# Patient Record
Sex: Female | Born: 1940 | Race: Black or African American | Hispanic: No | Marital: Married | Smoking: Never smoker
Health system: Southern US, Community
[De-identification: ages and names within clinical notes are randomized; demographics above are authoritative.]

## PROBLEM LIST (undated history)

## (undated) DIAGNOSIS — E119 Type 2 diabetes mellitus without complications: Secondary | ICD-10-CM

## (undated) DIAGNOSIS — I639 Cerebral infarction, unspecified: Secondary | ICD-10-CM

## (undated) DIAGNOSIS — E785 Hyperlipidemia, unspecified: Secondary | ICD-10-CM

## (undated) DIAGNOSIS — I10 Essential (primary) hypertension: Secondary | ICD-10-CM

## (undated) HISTORY — DX: Essential (primary) hypertension: I10

## (undated) HISTORY — DX: Hyperlipidemia, unspecified: E78.5

---

## 1997-09-04 ENCOUNTER — Ambulatory Visit (HOSPITAL_COMMUNITY): Admission: RE | Admit: 1997-09-04 | Discharge: 1997-09-04 | Payer: Self-pay | Admitting: General Surgery

## 1998-09-17 ENCOUNTER — Ambulatory Visit (HOSPITAL_COMMUNITY): Admission: RE | Admit: 1998-09-17 | Discharge: 1998-09-17 | Payer: Self-pay | Admitting: General Surgery

## 1999-06-13 ENCOUNTER — Emergency Department (HOSPITAL_COMMUNITY): Admission: EM | Admit: 1999-06-13 | Discharge: 1999-06-13 | Payer: Self-pay | Admitting: Emergency Medicine

## 1999-09-17 ENCOUNTER — Encounter (HOSPITAL_BASED_OUTPATIENT_CLINIC_OR_DEPARTMENT_OTHER): Payer: Self-pay | Admitting: General Surgery

## 1999-09-17 ENCOUNTER — Ambulatory Visit (HOSPITAL_COMMUNITY): Admission: RE | Admit: 1999-09-17 | Discharge: 1999-09-17 | Payer: Self-pay | Admitting: General Surgery

## 2000-03-01 ENCOUNTER — Encounter: Admission: RE | Admit: 2000-03-01 | Discharge: 2000-03-01 | Payer: Self-pay | Admitting: Family Medicine

## 2000-03-01 ENCOUNTER — Encounter: Payer: Self-pay | Admitting: Family Medicine

## 2000-03-24 ENCOUNTER — Encounter: Admission: RE | Admit: 2000-03-24 | Discharge: 2000-06-22 | Payer: Self-pay | Admitting: Family Medicine

## 2000-09-27 ENCOUNTER — Encounter (HOSPITAL_BASED_OUTPATIENT_CLINIC_OR_DEPARTMENT_OTHER): Payer: Self-pay | Admitting: General Surgery

## 2000-09-27 ENCOUNTER — Ambulatory Visit (HOSPITAL_COMMUNITY): Admission: RE | Admit: 2000-09-27 | Discharge: 2000-09-27 | Payer: Self-pay | Admitting: General Surgery

## 2000-12-05 ENCOUNTER — Ambulatory Visit (HOSPITAL_COMMUNITY): Admission: RE | Admit: 2000-12-05 | Discharge: 2000-12-05 | Payer: Self-pay | Admitting: Family Medicine

## 2001-09-29 ENCOUNTER — Encounter (HOSPITAL_BASED_OUTPATIENT_CLINIC_OR_DEPARTMENT_OTHER): Payer: Self-pay | Admitting: General Surgery

## 2001-09-29 ENCOUNTER — Ambulatory Visit (HOSPITAL_COMMUNITY): Admission: RE | Admit: 2001-09-29 | Discharge: 2001-09-29 | Payer: Self-pay | Admitting: General Surgery

## 2002-10-01 ENCOUNTER — Encounter: Payer: Self-pay | Admitting: Obstetrics and Gynecology

## 2002-10-01 ENCOUNTER — Ambulatory Visit (HOSPITAL_COMMUNITY): Admission: RE | Admit: 2002-10-01 | Discharge: 2002-10-01 | Payer: Self-pay | Admitting: Obstetrics and Gynecology

## 2003-10-02 ENCOUNTER — Ambulatory Visit (HOSPITAL_COMMUNITY): Admission: RE | Admit: 2003-10-02 | Discharge: 2003-10-02 | Payer: Self-pay | Admitting: Family Medicine

## 2004-01-16 ENCOUNTER — Encounter: Admission: RE | Admit: 2004-01-16 | Discharge: 2004-01-16 | Payer: Self-pay | Admitting: Family Medicine

## 2004-02-10 ENCOUNTER — Emergency Department (HOSPITAL_COMMUNITY): Admission: EM | Admit: 2004-02-10 | Discharge: 2004-02-10 | Payer: Self-pay | Admitting: Emergency Medicine

## 2004-09-04 ENCOUNTER — Emergency Department (HOSPITAL_COMMUNITY): Admission: EM | Admit: 2004-09-04 | Discharge: 2004-09-04 | Payer: Self-pay

## 2004-10-02 ENCOUNTER — Ambulatory Visit (HOSPITAL_COMMUNITY): Admission: RE | Admit: 2004-10-02 | Discharge: 2004-10-02 | Payer: Self-pay | Admitting: Family Medicine

## 2005-03-30 ENCOUNTER — Inpatient Hospital Stay (HOSPITAL_COMMUNITY): Admission: EM | Admit: 2005-03-30 | Discharge: 2005-04-01 | Payer: Self-pay | Admitting: Emergency Medicine

## 2005-03-31 ENCOUNTER — Ambulatory Visit: Payer: Self-pay | Admitting: Cardiology

## 2005-03-31 ENCOUNTER — Encounter: Payer: Self-pay | Admitting: Cardiology

## 2005-10-04 ENCOUNTER — Ambulatory Visit (HOSPITAL_COMMUNITY): Admission: RE | Admit: 2005-10-04 | Discharge: 2005-10-04 | Payer: Self-pay | Admitting: Family Medicine

## 2006-01-22 ENCOUNTER — Emergency Department (HOSPITAL_COMMUNITY): Admission: EM | Admit: 2006-01-22 | Discharge: 2006-01-22 | Payer: Self-pay | Admitting: Emergency Medicine

## 2006-09-13 ENCOUNTER — Encounter: Admission: RE | Admit: 2006-09-13 | Discharge: 2006-09-13 | Payer: Self-pay | Admitting: Orthopedic Surgery

## 2006-10-06 ENCOUNTER — Ambulatory Visit (HOSPITAL_COMMUNITY): Admission: RE | Admit: 2006-10-06 | Discharge: 2006-10-06 | Payer: Self-pay | Admitting: Family Medicine

## 2007-02-11 ENCOUNTER — Ambulatory Visit: Payer: Self-pay | Admitting: Cardiology

## 2007-02-11 ENCOUNTER — Encounter (INDEPENDENT_AMBULATORY_CARE_PROVIDER_SITE_OTHER): Payer: Self-pay | Admitting: Neurology

## 2007-02-11 ENCOUNTER — Inpatient Hospital Stay (HOSPITAL_COMMUNITY): Admission: EM | Admit: 2007-02-11 | Discharge: 2007-02-16 | Payer: Self-pay | Admitting: Emergency Medicine

## 2007-02-12 ENCOUNTER — Encounter (INDEPENDENT_AMBULATORY_CARE_PROVIDER_SITE_OTHER): Payer: Self-pay | Admitting: Neurology

## 2007-02-13 ENCOUNTER — Encounter (INDEPENDENT_AMBULATORY_CARE_PROVIDER_SITE_OTHER): Payer: Self-pay | Admitting: Neurology

## 2007-02-15 ENCOUNTER — Encounter (INDEPENDENT_AMBULATORY_CARE_PROVIDER_SITE_OTHER): Payer: Self-pay | Admitting: Neurology

## 2007-10-10 ENCOUNTER — Ambulatory Visit (HOSPITAL_COMMUNITY): Admission: RE | Admit: 2007-10-10 | Discharge: 2007-10-10 | Payer: Self-pay | Admitting: Family Medicine

## 2008-03-11 ENCOUNTER — Encounter: Admission: RE | Admit: 2008-03-11 | Discharge: 2008-03-11 | Payer: Self-pay | Admitting: Orthopedic Surgery

## 2008-10-11 ENCOUNTER — Ambulatory Visit (HOSPITAL_COMMUNITY): Admission: RE | Admit: 2008-10-11 | Discharge: 2008-10-11 | Payer: Self-pay | Admitting: Family Medicine

## 2009-10-14 ENCOUNTER — Ambulatory Visit (HOSPITAL_COMMUNITY): Admission: RE | Admit: 2009-10-14 | Discharge: 2009-10-14 | Payer: Self-pay | Admitting: Family Medicine

## 2010-06-28 ENCOUNTER — Encounter: Payer: Self-pay | Admitting: Family Medicine

## 2010-09-15 ENCOUNTER — Ambulatory Visit
Admission: RE | Admit: 2010-09-15 | Discharge: 2010-09-15 | Disposition: A | Discharge status: Home / Self Care | Payer: Medicare Other | Source: Ambulatory Visit | Attending: Orthopedic Surgery | Admitting: Orthopedic Surgery

## 2010-09-15 ENCOUNTER — Other Ambulatory Visit: Payer: Self-pay | Admitting: Orthopedic Surgery

## 2010-09-15 ENCOUNTER — Other Ambulatory Visit (HOSPITAL_COMMUNITY): Payer: Self-pay | Admitting: Family Medicine

## 2010-09-15 DIAGNOSIS — Z1231 Encounter for screening mammogram for malignant neoplasm of breast: Secondary | ICD-10-CM

## 2010-09-15 DIAGNOSIS — M545 Low back pain: Secondary | ICD-10-CM

## 2010-09-28 ENCOUNTER — Other Ambulatory Visit: Payer: Self-pay | Admitting: Orthopedic Surgery

## 2010-09-28 DIAGNOSIS — M545 Low back pain: Secondary | ICD-10-CM

## 2010-10-08 ENCOUNTER — Ambulatory Visit
Admission: RE | Admit: 2010-10-08 | Discharge: 2010-10-08 | Disposition: A | Discharge status: Home / Self Care | Payer: Medicare Other | Source: Ambulatory Visit | Attending: Orthopedic Surgery | Admitting: Orthopedic Surgery

## 2010-10-08 DIAGNOSIS — M545 Low back pain: Secondary | ICD-10-CM

## 2010-10-08 MED ORDER — GADOBENATE DIMEGLUMINE 529 MG/ML IV SOLN
16.0000 mL | Freq: Once | INTRAVENOUS | Status: AC | PRN
  Administered 2010-10-08: 16 mL via INTRAVENOUS

## 2010-10-09 ENCOUNTER — Other Ambulatory Visit: Payer: Medicare Other

## 2010-10-13 ENCOUNTER — Emergency Department (HOSPITAL_COMMUNITY)
Admission: EM | Admit: 2010-10-13 | Discharge: 2010-10-14 | Disposition: A | Discharge status: Home / Self Care | Payer: Medicare Other | Attending: Emergency Medicine | Admitting: Emergency Medicine

## 2010-10-13 DIAGNOSIS — I10 Essential (primary) hypertension: Secondary | ICD-10-CM | POA: Insufficient documentation

## 2010-10-13 DIAGNOSIS — F29 Unspecified psychosis not due to a substance or known physiological condition: Secondary | ICD-10-CM | POA: Insufficient documentation

## 2010-10-13 LAB — BASIC METABOLIC PANEL
BUN: 19 mg/dL (ref 6–23)
GFR calc non Af Amer: 45 mL/min — ABNORMAL LOW (ref >60)
Potassium: 3.4 mEq/L — ABNORMAL LOW (ref 3.5–5.1)
Sodium: 141 mEq/L (ref 135–145)

## 2010-10-13 LAB — CBC
Platelets: 186 10*3/uL (ref 150–400)
RBC: 4.49 MIL/uL (ref 3.87–5.11)
WBC: 6.5 10*3/uL (ref 4.0–10.5)

## 2010-10-13 LAB — URINALYSIS, ROUTINE W REFLEX MICROSCOPIC
Ketones, ur: NEGATIVE mg/dL
Nitrite: NEGATIVE
Specific Gravity, Urine: 1.006 (ref 1.005–1.030)
pH: 7.5 (ref 5.0–8.0)

## 2010-10-13 LAB — DIFFERENTIAL
Basophils Absolute: 0 10*3/uL (ref 0.0–0.1)
Basophils Relative: 1 % (ref 0–1)
Eosinophils Absolute: 0.1 10*3/uL (ref 0.0–0.7)
Neutrophils Relative %: 79 % — ABNORMAL HIGH (ref 43–77)

## 2010-10-13 LAB — ETHANOL: Alcohol, Ethyl (B): <11 mg/dL — ABNORMAL HIGH (ref 0–10)

## 2010-10-13 LAB — RAPID URINE DRUG SCREEN, HOSP PERFORMED
Amphetamines: NOT DETECTED
Cocaine: NOT DETECTED
Tetrahydrocannabinol: NOT DETECTED

## 2010-10-13 LAB — URINE MICROSCOPIC-ADD ON

## 2010-10-14 DIAGNOSIS — F4320 Adjustment disorder, unspecified: Secondary | ICD-10-CM

## 2010-10-14 LAB — GLUCOSE, CAPILLARY: Glucose-Capillary: 100 mg/dL — ABNORMAL HIGH (ref 70–99)
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
## 2010-10-20 NOTE — Consult Note (Signed)
Sherri [name], Sherri [name] NO.:  [id number]   MEDICAL RECORD NO.:  [id number]          PATIENT TYPE:  EMS   LOCATION:  [facility]                         FACILITY:  [facility]   PHYSICIAN:  Marlan Palau, M.D.  DATE OF BIRTH:  Apr 23, 1941   DATE OF CONSULTATION:  02/10/2007  DATE OF DISCHARGE:                                 CONSULTATION   HISTORY OF PRESENT ILLNESS:  The patient is a 70 year old right-handed  black female born 2041-05-03 with a history of hypertension and  degenerative arthritis.  This patient comes to [facility] Emergency Room  after noting onset of slurred speech and nonsensical speech that began  around 9:15 p.m. this evening.  The patient denied any headache or  visual field change, apparently was noted to have some right facial  droop, but denied any weakness of the extremities, was able to walk  well.   The patient was brought to the [facility] Emergency Room where she was noted  to gradually be improving over time.  The CT scan of the brain showed an  old right frontal stroke but no acute changes were seen.  The patient is  not felt to be a TPA candidate due to minimal deficit with improving  deficit.  The patient, however, has waxed and waned during the emergency  room stay and has worsened with the speech with some minimal right  facial droop also being noted as well at times.  The patient therefore  appears to be slightly unstable neurologically.  The patient is being  admitted for evaluation of a stroke-like event.   PAST MEDICAL HISTORY:  The past medical history is significant for:  1. New onset of slurred speech and aphasia.  2. Hypertension.  3. Degenerative arthritis involving the right hip.  4. Bilateral tubal ligation.   ALLERGIES:  The patient has no known allergies.   SUBSTANCE HISTORY:  The patient does not smoke or drink.   CURRENT MEDICATIONS:  The current medications include Diovan/HCT 160/25  one daily, multivitamins daily, calcium 600  mg daily, Norvasc 10 mg  daily, sulindac 200 mg b.i.d.   SOCIAL HISTORY:  [place], is married, has four children who are alive and well.  The patient  is retired.   FAMILY HISTORY:  The family medical history is notable that both parents  have passed away, unknown cause.  Father had some sort of a blood  disease.  The patient has one brother who is alive and well.   REVIEW OF SYSTEMS:  The review of systems is notable for no recent  fevers or chills.  The patient denies headache, denies any problems with  visual field.  The patient has had no troubles with shortness of breath,  chest pains, abdominal pains, nausea, or vomiting.  The patient does  report some urinary urgency at times.  The patient has not any falls or  blackout episodes.   PHYSICAL EXAMINATION:  VITAL SIGNS:  Blood pressure is 138/71, heart  rate 83, respiratory rate 20, temperature afebrile.  GENERAL:  This patient is  a minimally obese black female who is alert,  cooperative at the time of the examination.  HEENT:  The head is atraumatic.  The eyes show the pupils are equal,  round, react to light.  Disks are flat bilaterally.  NECK:  The neck is supple.  No carotid bruits are noted.  RESPIRATORY:  Examination is clear.  CARDIOVASCULAR:  Examination reveals a regular rate and rhythm.  No  obvious murmurs or rubs noted.  EXTREMITIES:  The extremities are without significant edema.  NEUROLOGIC:  Cranial nerves are as above.  Facial symmetry is present on  the initial clinical examination.  Pinprick sensation of the face is  symmetric.  Extraocular movements are full, visual fields are full.  Speech is mildly dysarthric.  Motor test reveals 5/5 strength in all  fours.  Good symmetric motor is noted throughout.  Sensory testing is  intact to pinprick, soft touch, vibratory sensation throughout.  The  patient has good finger-nose-finger and heel-to-shin.  No drift is  seen  in the upper extremities or lower extremities.  Deep tendon reflexes are  symmetric.  Toes are downgoing bilaterally.   LABORATORY VALUES:  The laboratory values are notable for a white count  of 8.0, hemoglobin of 12.3, hematocrit 37.0, MCV of 88.9, and platelets  of 256.  INR of 0.9, sodium 138, potassium 3.7, chloride 105, CO2 28,  glucose of 98, BUN of 25, creatinine 1.32, total bilirubin of 0.7,  alkaline phosphatase of 66, SGOT of 27, SGPT of 18, total protein 6.8,  albumin 3.8, calcium 9.4, CK of 261, MB fraction 2.9, and troponin I of  0.03.   NIH stroke scale is one but subsequent evaluations, after clinical  worsening, has gone up to three.   IMPRESSION:  1. Left brain stroke event.  2. Hypertension.  3. History of prior cerebrovascular disease.   This patient has two medical record numbers and has had a stroke workup  in 2006 under the medical record number [id number], is currently being  admitted under medical record number [id number].  The patient had been  placed on aspirin after her last stroke evaluation but has gone off this  medication.  The patient comes in with a waxing and waning speech  deficit and mild right facial droop.  The patient is not a TPA candidate  due to minimal deficit and currently due to duration of deficit.  The  patient will be admitted to [facility] for further stroke evaluation.   PLAN:  1. Admission to [facility].  2. Consider IV heparin therapy given some instability with clinical      examination.  3. MRI of the brain.  4. MRI angiogram of intracranial and extracranial vessels.  5. A 2-D echocardiogram.  6. Urine drug screen.  7. Antiplatelet therapy as well.  Will follow the patient's clinical      course while in-house.      Marlan Palau, M.D.  Electronically Signed     CKW/MEDQ  D:  02/11/2007  T:  02/11/2007  Job:  [id number]   cc:   Marlan Palau, M.D.  Renaye Rakers, M.D.

## 2010-10-20 NOTE — Discharge Summary (Signed)
NAMEJOELLY, BOLANOS              ACCOUNT NO.:  [id number]   MEDICAL RECORD NO.:  [id number]          PATIENT TYPE:  INP   LOCATION:  3032                         FACILITY:  [facility]   PHYSICIAN:  Pramod P. Pearlean Brownie, MD    DATE OF BIRTH:  03-Sep-1940   DATE OF ADMISSION:  02/10/2007  DATE OF DISCHARGE:  02/16/2007                               DISCHARGE SUMMARY   DISCHARGE DIAGNOSES:  1. 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  2. Dyslipidemia.  3. Hypertension.  4. Right hip arthritis.  5. History of bilateral tubal ligation.  6. Urinary tract infection treated with 5-day course of Cipro.   DISCHARGE MEDICATIONS:  1. Diovan 160/25 mg a day.  2. Multivitamin a day.  3. Calcium 600 mg a day.  4. Amlodipine 10 mg a day.  5. Sulindac 200 mg b.i.d.  6. Fish oil a day.  7. Benefiber a day.  8. Simvastatin 20 mg at bedtime.  9. Aspirin 325 mg a day.   STUDIES PERFORMED:  1. CT of the brain on admission showed subacute, chronic, right      frontal lobe, subcortical infarct, chronic small vessel ischemic      disease and brain atrophy.  2. MRI of the brain shows acute, small nonhemorrhagic infarct,      posterior left frontal lobe bordering the junction with the left      parietal lobe.  Atrophy with several old infarcts and small vessel      disease type changes.  3. MRA of the head shows mild branch vessel atherosclerotic type      changes.  Minimal bulbous appearance of the right middle cerebral      artery.  Bifurcation may be related to origin of the vessel,      although tiny aneurysm cannot be excluded.  4. MRA of the neck shows no hemodynamically significant stenosis.  5. Carotid Doppler shows no ICA stenosis.  6. Two-dimensional echocardiogram shows EF of 65% with no source of      embolus.  7. EKG shows normal sinus rhythm, low-voltage QRS,  poor R-wave      progression.  No old tracings to compare.  8. TEE with an EF of 65%, no source of embolus, no PFO, no ASA, no      ASD.   LABORATORY STUDIES:  CBC with hemoglobin 11.3, hematocrit 34.2,  otherwise normal.  Chemistry normal.  Coagulation studies normal.  Liver  function tests not performed.  Cholesterol 178, triglycerides 78, HDL  40, LDL 122.  Urinalysis showed 11-20 white blood cells, 0-2 red blood  cells.  Urine drug screen was negative.  Homocystine 12.6.  Alcohol  level less than 5.  Hemoglobin A1c 5.9.  Cardiac enzymes with CK  elevated at 261, but MB and troponin negative.   HISTORY OF PRESENT ILLNESS:  [name] is 70 year old, right-  handed, African American female with a history of hypertension and  degenerative arthritis.  The patient presented to [facility] Emergency  Room after noting onset of slurred speech and nonsensical speech that  began around 9:15 p.m. the evening of admission.  She was brought to the  emergency room where she was noted to gradually improve over time.  CT  of the brain showed an old right frontal stroke, but no acute  abnormalities.  She was not felt to be a TPA candidate due to minimal  deficit with improving deficit.  The patient waxed and waned in the  emergency room with some minimal right facial droop.  She was admitted  to the hospital for stroke evaluation.  She did not receive TPA.   HOSPITAL COURSE:  MRI did reveal acute infarct in left parietal and  frontal regions as well as old bilateral infarcts.  It is felt that the  patient most likely had an embolic source, though no source was found  during hospitalization including during a transesophageal echocardiogram  which was unrevealing. of pain. The patient was found to have an  elevated LDL of 122 and was placed on statin for that.  She was  evaluated by PT/OT and speech therapy and felt to benefit from  outpatient PT and OT, but is safe to return home.  She has  been advised  that it is okay for her to drive at this point.  She does need  continuation of evaluation for possible embolic source as an outpatient.   CONDITION ON DISCHARGE:  The patient alert and oriented x3.  No aphasia  or dysarthria.  Eye movements are full.  She has no upper extremity  drift.  Her gait is slightly ataxic with tandem walking.  She has no  focal weakness in her arms and legs.  No sensory loss.   DISPOSITION:  Discharge home with family.   ACTIVITY:  Outpatient PT and OT.   DISCHARGE MEDICATIONS:  Aspirin for stroke prevention.   FOLLOW UP:  Follow up with primary care physician, Dr. Renaye Rakers,  within 1 month for ongoing risk factor control.  Outpatient cardiac  monitoring with [facility] arranged for February 17, 2007,  at 10 a.m.  Follow up with Dr. Pearlean Brownie in 2-3 months.      Sherri [name], N.P.    ______________________________  Sunny Schlein. Pearlean Brownie, MD    SB/MEDQ  D:  02/16/2007  T:  02/17/2007  Job:  [id number]   cc:   Renaye Rakers, M.D.  [facility]

## 2010-10-22 ENCOUNTER — Ambulatory Visit (HOSPITAL_COMMUNITY)
Admission: RE | Admit: 2010-10-22 | Discharge: 2010-10-22 | Disposition: A | Discharge status: Home / Self Care | Payer: Medicare Other | Source: Ambulatory Visit | Attending: Family Medicine | Admitting: Family Medicine

## 2010-10-22 DIAGNOSIS — Z1231 Encounter for screening mammogram for malignant neoplasm of breast: Secondary | ICD-10-CM | POA: Insufficient documentation

## 2010-10-23 NOTE — Discharge Summary (Signed)
Sherri [name], [name]              ACCOUNT NO.:  [id number]   MEDICAL RECORD NO.:  [id number]          PATIENT TYPE:  INP   LOCATION:  3708                         FACILITY:  [facility]   PHYSICIAN:  Michaelyn Barter, M.D. DATE OF BIRTH:  26-Aug-1940   DATE OF ADMISSION:  03/30/2005  DATE OF DISCHARGE:  04/01/2005                                 DISCHARGE SUMMARY   FINAL DIAGNOSES AT THE TIME OF DISCHARGE:  1.  Transient ischemic attack.  2.  Hyperlipidemia.   PRIMARY CARE PHYSICIAN:  Renaye Rakers, M.D.   HISTORY OF PRESENT ILLNESS:  [name] is a 70 year old female who arrived  in the ER with a chief complaint of transient right-sided weakness.  She  stated she felt well the morning of her admission.  She went to the [facility] and  completed an exercise routine.  On her way home, she stopped at a gas  station, and shortly after attempting to leave the gas station, her right  arm and hand became weak, and she had difficulty manipulating the gears of  the car.  She managed to back her car up, she got out of the car, and noted  that her right leg was dragging as she attempted to walk.  She denied having  any blurred vision.  No speech difficulties.  No chest pain or palpitations  at that time.  She saw her primary care physician, Dr. Renaye Rakers, and  subsequently was referred to the emergency room for further evaluation.   PAST MEDICAL HISTORY:  1.  Osteoarthritis.  2.  Hypertension.   SOCIAL HISTORY:  Cigarettes - the patient denies.  Alcohol - the patient  denies.   HOSPITAL COURSE:  1.  Transient ischemic attack.  Following her presentation to the emergency      room, the patient underwent an MRI/MRA.  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      Her MRA had the final impression of (1) arterial sclerotic changes      involving the right MCA inferior division and possibly the left middle      cerebral artery proximally, (2) no severe stenosis, dissections, or      aneurysms suggested on the images provides.  In addition, the patient      had carotid ultrasound completed which was done March 31, 2005, the      final results of which were no significant plaque noted, no internal      artery carotid stenosis.  Vertebral artery flow was found to be  antegrade.  Finally, the patient had a 2-D echocardiogram completed, and      it was completed on March 31, 2005.  The final impression of that was      a left ventricular ejection fraction was estimated to be 60%.  There was      no evidence of left ventricular regional wall motion abnormalities.      There was good left ventricular function, and no obvious cardiac source      of embolus.  Finally, the patient had cardiac enzymes completed to rule      her out for a myocardial event, and they were negative x3.  Over the      course of her hospitalization, the patient denied having any additional      symptoms of weakness, and there did not appear to be any residual effect      from the transient ischemic attack.  As of today, the patient states      that she feels pretty good, and she actually requests to be discharged      home today.  She stated that she thought she was going home yesterday.  2.  Hyperlipidemia.  The patient had a fasting lipid profile completed.  The      final results of the patient's fasting lipid profile were as follows -      the patient's triglycerides were 136, cholesterol 198, HDL 41, LDL 130.       The decision was made to start the patient on Zocor secondary to her      elevated LDL.   CONDITION AT THE TIME OF DISCHARGE:  Improved.   VITAL SIGNS TODAY:  Temperature of 97.1, heart rate 64, respirations 16,  blood pressure 110/63, and she is satting 97% on room air.   The decision has been made to discharge the patient home.  The patient will  be discharged home on the following medications.   DISCHARGE MEDICATIONS:  1.  Zocor 20 mg one tablet p.o. daily.  2.  Aspirin 325 mg p.o. daily.  (The patient instructs me that she has all of her other medications, and  states that she does not need any refills.)      Michaelyn Barter, M.D.  Electronically Signed     OR/MEDQ  D:  04/01/2005  T:  04/01/2005  Job:  [id number]   cc:   Renaye Rakers, M.D.  Fax: [phone]

## 2010-10-23 NOTE — H&P (Signed)
[name], [name]              ACCOUNT NO.:  [id number]   MEDICAL RECORD NO.:  [id number]          PATIENT TYPE:  EMS   LOCATION:  [facility]                         FACILITY:  [facility]   PHYSICIAN:  Isidor Holts, M.D.  DATE OF BIRTH:  07/27/1940   DATE OF ADMISSION:  03/30/2005  DATE OF DISCHARGE:                                HISTORY & PHYSICAL   PRIMARY CARE PHYSICIAN:  Sherri [name], M.D.   CHIEF COMPLAINT:  Transient right-sided weakness.   HISTORY OF PRESENT ILLNESS:  This is a 70 year old female, with known  history of hypertension. She was quite well until she got up at 7:15 a.m. on  March 30, 2005. She got dressed, went to the [employer], walked 1 mile,  and the swam in the swimming pool.  After that, she took a shower.  Following this, she became somewhat lightheaded.  She decided to drive  home.  On the way home, she stopped at a filling station to get gas, but on  attempt to drive off, found that her right arm and hand had become weak and  she was having difficulty manipulating the gear lever.  She finally managed  to back up, get out of the car and noted that her right leg was dragging  as she tried to walk.  There was no blurring of vision, no slurring of  speech, no chest pain or palpitations.  After a while, she felt stronger,  drove home, called her spouse who called the primary care physician.  She  was seen at Dr. Tedra Senegal office at 1:45 p.m. and referred to the emergency  department.   PAST MEDICAL HISTORY:  1.  Osteoarthritis.  2.  Hypertension.   MEDICATIONS:  1.  Skelaxin 1 p.o. nightly.  2.  Norvasc 10 mg p.o. daily.  3.  Diovan, unknown dose.  Note: Husband has undertaken to call in patient's      correct medications and dosage once he gets home.   ALLERGIES:  No known drug allergies.   REVIEW OF SYSTEMS:  As per HPI and Chief Complaint, otherwise negative.   SOCIAL HISTORY AND FAMILY HISTORY:  The patient is married.  She is a  retired Runner, broadcasting/film/video.   Nonsmoker, nondrinker.  Has no history of drug abuse.  She  has four offspring and one brother, all of whom are alive and well.  Her  parents are deceased, her mother at age 88 years and her father at age 11  years.  Their health history is not known to patient.   PHYSICAL EXAMINATION:  VITAL SIGNS:  Temperature 97, blood pressure 124/84  mmHg, respiratory rate 16, pulse 78 per minute and regular, pulse oximetry  95% on room air.  GENERAL:  Alert and oriented x3. Comfortable, communicative, but short of  breath at rest.  HEENT:  No clinical pallor.  No jaundice.  No conjunctival injection.  Throat is quite clear.  NECK:  Supple.  JVP not seen.  No palpable lymphadenopathy.  No palpable  goiter. No carotid bruits.  CHEST: Clinically clear to auscultation.  No wheezes, no crackles.  CARDIAC:  Heart sounds 1 and 2 heard.  Normal, regular, no murmurs.  ABDOMEN:  Full, soft, and nontender.  There is no palpable  hepatosplenomegaly, no palpable masses, normal bowel sounds.  EXTREMITIES:  Lower extremity examination: No pitting edema.  Palpable  peripheral pulses.  MUSCULOSKELETAL: System quite unremarkable apart from osteoarthritic  changes.  CENTRAL NERVOUS SYSTEM: No cranial nerve deficit.  Power appears to be 5/5  in all limbs.  Muscle tone and coordination appear to be within normal  limits.  Plantar response equivocal bilaterally.   INVESTIGATIONS:  CBC: WBC 7.2, hemoglobin 13.4, hematocrit 41.3, platelets  319.  INR 1, APTT 29 seconds.  Troponin I at point-of-care less than 0.05.  Electrolytes: Sodium 143, potassium 3.9, chloride 107, CO2 29, BUN 16,  creatinine 1.2, glucose 85.  LFTs are normal.  EKG shows sinus rhythm,  regular, 58 per minute, normal axis.  Poor R wave progression across the  chest leads.  No acute ischemic changes.   MRI of the brain shows no acute abnormality.  There is chronic ischemia  especially in the cerebellum bilaterally, also right frontal, left  parietal,  and bilateral white matter.   MRA is negative.   Chest x-ray appears normal; however, formal report is still pending.   ASSESSMENT AND PLAN:  1.  Transient ischemic attack.  This appears to have resolved.  The patient      has risk factors, however, ie age and hypertension.  MRI is negative for      cerebrovascular accident.  Physical examination is normal.  We will      admit patient for telemetric monitoring, do regular neurologic checks,      TIA workup, including lipid profile, TSH, homocysteine level, RPR, 2-D      echocardiogram, and start aspirin 325 mg p.o. daily.   1.  History of osteoarthritis.  Will manage with p.r.n. analgesics.   1.  Hypertension. The patient is normotensive at present.  Will continue      Norvasc 10 mg p.o. daily and monitor.  It is likely we shall restart her      pre-admission dosage of Diovan when this is known.   1.  Gastrointestinal prophylaxis with proton pump inhibitor, and Deep vein      thrombosis prophylaxis with Lovenox.   Further management will depend on clinical course.      Isidor Holts, M.D.  Electronically Signed     CO/MEDQ  D:  03/30/2005  T:  03/30/2005  Job:  [id number]   cc:   Sherri [name], M.D.  Fax: [phone]

## 2011-02-24 ENCOUNTER — Ambulatory Visit
Admission: RE | Admit: 2011-02-24 | Discharge: 2011-02-24 | Disposition: A | Discharge status: Home / Self Care | Payer: Medicare Other | Source: Ambulatory Visit | Attending: Orthopedic Surgery | Admitting: Orthopedic Surgery

## 2011-02-24 ENCOUNTER — Other Ambulatory Visit: Payer: Self-pay | Admitting: Orthopedic Surgery

## 2011-02-24 DIAGNOSIS — M1990 Unspecified osteoarthritis, unspecified site: Secondary | ICD-10-CM

## 2011-03-19 LAB — URINALYSIS, ROUTINE W REFLEX MICROSCOPIC
Bilirubin Urine: NEGATIVE
Glucose, UA: NEGATIVE
Nitrite: NEGATIVE
Specific Gravity, Urine: 1.017
pH: 7.5

## 2011-03-19 LAB — CBC
HCT: 34.2 — ABNORMAL LOW
MCHC: 33.2
MCHC: 34
MCV: 87
MCV: 89
Platelets: 225
Platelets: 256
Platelets: 257
RBC: 4.16
RDW: 13.9
WBC: 6.5

## 2011-03-19 LAB — URINE CULTURE

## 2011-03-19 LAB — DIFFERENTIAL
Basophils Absolute: 0
Basophils Relative: 1
Eosinophils Absolute: 0.4
Lymphs Abs: 2.8
Monocytes Relative: 7
Neutro Abs: 4.1
Neutrophils Relative %: 51
Neutrophils Relative %: 64

## 2011-03-19 LAB — HEPARIN LEVEL (UNFRACTIONATED)
Heparin Unfractionated: 0.12 — ABNORMAL LOW
Heparin Unfractionated: 0.53
Heparin Unfractionated: 0.62

## 2011-03-19 LAB — PROTIME-INR
INR: 0.9
Prothrombin Time: 12.8

## 2011-03-19 LAB — HEMOGLOBIN A1C
Hgb A1c MFr Bld: 5.9
Mean Plasma Glucose: 133

## 2011-03-19 LAB — URINE MICROSCOPIC-ADD ON

## 2011-03-19 LAB — BASIC METABOLIC PANEL
BUN: 13
Chloride: 111
Glucose, Bld: 96
Potassium: 3.6

## 2011-03-19 LAB — RAPID URINE DRUG SCREEN, HOSP PERFORMED
Cocaine: NOT DETECTED
Tetrahydrocannabinol: NOT DETECTED

## 2011-03-19 LAB — COMPREHENSIVE METABOLIC PANEL
BUN: 25 — ABNORMAL HIGH
Calcium: 9.4
Creatinine, Ser: 1.32 — ABNORMAL HIGH
GFR calc non Af Amer: 40 — ABNORMAL LOW
Glucose, Bld: 98
Sodium: 138
Total Protein: 6.8

## 2011-03-19 LAB — CK TOTAL AND CKMB (NOT AT ARMC)
CK, MB: 2.9
Total CK: 261 — ABNORMAL HIGH

## 2011-03-19 LAB — LIPID PANEL: VLDL: 16

## 2011-03-19 LAB — APTT: aPTT: 28

## 2011-04-12 ENCOUNTER — Ambulatory Visit (INDEPENDENT_AMBULATORY_CARE_PROVIDER_SITE_OTHER): Payer: Medicare Other | Admitting: Family Medicine

## 2011-04-12 ENCOUNTER — Encounter: Payer: Self-pay | Admitting: Family Medicine

## 2011-04-12 VITALS — BP 120/80 | HR 98 | Temp 98.3°F | Ht 66.75 in | Wt 170.5 lb

## 2011-04-12 DIAGNOSIS — Z23 Encounter for immunization: Secondary | ICD-10-CM

## 2011-04-12 DIAGNOSIS — I10 Essential (primary) hypertension: Secondary | ICD-10-CM

## 2011-04-12 DIAGNOSIS — E785 Hyperlipidemia, unspecified: Secondary | ICD-10-CM

## 2011-04-12 LAB — LIPID PANEL: Cholesterol: 139 mg/dL (ref 0–200)

## 2011-04-12 LAB — BASIC METABOLIC PANEL
BUN: 20 mg/dL (ref 6–23)
Calcium: 9.9 mg/dL (ref 8.4–10.5)
Creatinine, Ser: 1.2 mg/dL (ref 0.4–1.2)
GFR: 55.25 mL/min — ABNORMAL LOW (ref >60.00)
Potassium: 3.1 mEq/L — ABNORMAL LOW (ref 3.5–5.1)

## 2011-04-12 NOTE — Patient Instructions (Signed)
Good to meet you. We will wait for your records and set up your yearly physical.
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
## 2011-05-05 ENCOUNTER — Ambulatory Visit (INDEPENDENT_AMBULATORY_CARE_PROVIDER_SITE_OTHER): Payer: Medicare Other | Admitting: Family Medicine

## 2011-05-05 ENCOUNTER — Encounter: Payer: Self-pay | Admitting: Family Medicine

## 2011-05-05 VITALS — BP 120/72 | HR 68 | Temp 97.8°F | Ht 66.75 in | Wt 173.5 lb

## 2011-05-05 DIAGNOSIS — Z0000 Encounter for general adult medical examination without abnormal findings: Secondary | ICD-10-CM
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
## 2011-06-15 DIAGNOSIS — H4010X0 Unspecified open-angle glaucoma, stage unspecified: Secondary | ICD-10-CM | POA: Diagnosis not present

## 2011-06-15 DIAGNOSIS — E109 Type 1 diabetes mellitus without complications: Secondary | ICD-10-CM | POA: Diagnosis not present

## 2011-06-21 ENCOUNTER — Other Ambulatory Visit: Payer: Self-pay | Admitting: *Deleted

## 2011-06-21 MED ORDER — VALSARTAN-HYDROCHLOROTHIAZIDE 160-12.5 MG PO TABS: 1.0000 | ORAL_TABLET | Freq: Every day | ORAL | Status: DC

## 2011-06-29 ENCOUNTER — Other Ambulatory Visit: Payer: Self-pay | Admitting: *Deleted

## 2011-06-29 MED ORDER — VALSARTAN-HYDROCHLOROTHIAZIDE 160-12.5 MG PO TABS: 1.0000 | ORAL_TABLET | Freq: Every day | ORAL | Status: DC

## 2011-06-29 MED ORDER — AMLODIPINE BESYLATE 10 MG PO TABS: 10.0000 mg | ORAL_TABLET | Freq: Every day | ORAL | Status: DC

## 2011-06-29 MED ORDER — ROSUVASTATIN CALCIUM 5 MG PO TABS: 5.0000 mg | ORAL_TABLET | Freq: Every day | ORAL | Status: DC

## 2011-06-29 NOTE — Telephone Encounter (Signed)
Ok to refill all 3 and add crestor to list.

## 2011-06-29 NOTE — Telephone Encounter (Signed)
Medication sent electronically 

## 2011-06-29 NOTE — Telephone Encounter (Addendum)
Patient requested a refill on Amlodipine, Diovan HCT and Crestor 5 mg. Patient stated that she wanted this sent to her mail order pharmacy that is listed in her chart.  Crestor is not on patient's medications list.

## 2011-06-29 NOTE — Telephone Encounter (Signed)
Pt called to give a number for her medication [phone].

## 2011-07-28 DIAGNOSIS — M169 Osteoarthritis of hip, unspecified: Secondary | ICD-10-CM | POA: Diagnosis not present

## 2011-07-28 DIAGNOSIS — M5137 Other intervertebral disc degeneration, lumbosacral region: Secondary | ICD-10-CM | POA: Diagnosis not present

## 2011-08-12 ENCOUNTER — Encounter: Payer: Self-pay | Admitting: Family Medicine

## 2011-08-12 ENCOUNTER — Ambulatory Visit (INDEPENDENT_AMBULATORY_CARE_PROVIDER_SITE_OTHER): Payer: Medicare Other | Admitting: Family Medicine

## 2011-08-12 VITALS — BP 130/80 | HR 80 | Temp 98.1°F | Wt 169.0 lb

## 2011-08-12 DIAGNOSIS — Z658 Other specified problems related to psychosocial circumstances: Secondary | ICD-10-CM

## 2011-08-12 DIAGNOSIS — Z733 Stress, not elsewhere classified: Secondary | ICD-10-CM | POA: Diagnosis not present
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
## 2011-09-10 ENCOUNTER — Other Ambulatory Visit: Payer: Self-pay | Admitting: Family Medicine

## 2011-09-10 DIAGNOSIS — Z1231 Encounter for screening mammogram for malignant neoplasm of breast: Secondary | ICD-10-CM

## 2011-10-25 DIAGNOSIS — M169 Osteoarthritis of hip, unspecified: Secondary | ICD-10-CM | POA: Diagnosis not present

## 2011-10-27 ENCOUNTER — Encounter: Payer: Self-pay | Admitting: Family Medicine

## 2011-10-27 ENCOUNTER — Ambulatory Visit (HOSPITAL_COMMUNITY)
Admission: RE | Admit: 2011-10-27 | Discharge: 2011-10-27 | Disposition: A | Discharge status: Home / Self Care | Payer: Medicare Other | Source: Ambulatory Visit | Attending: Family Medicine | Admitting: Family Medicine

## 2011-10-27 ENCOUNTER — Encounter: Payer: Self-pay | Admitting: *Deleted

## 2011-10-27 DIAGNOSIS — Z1231 Encounter for screening mammogram for malignant neoplasm of breast: Secondary | ICD-10-CM | POA: Diagnosis not present

## 2011-10-27 LAB — HM MAMMOGRAPHY: HM Mammogram: NORMAL

## 2011-11-30 DIAGNOSIS — H43399 Other vitreous opacities, unspecified eye: Secondary | ICD-10-CM | POA: Diagnosis not present

## 2011-11-30 DIAGNOSIS — E109 Type 1 diabetes mellitus without complications: Secondary | ICD-10-CM | POA: Diagnosis not present

## 2011-11-30 DIAGNOSIS — H4010X0 Unspecified open-angle glaucoma, stage unspecified: Secondary | ICD-10-CM | POA: Diagnosis not present

## 2011-12-21 ENCOUNTER — Ambulatory Visit: Payer: Medicare Other | Admitting: Family Medicine

## 2011-12-23 ENCOUNTER — Other Ambulatory Visit: Payer: Self-pay | Admitting: Family Medicine

## 2011-12-23 ENCOUNTER — Ambulatory Visit (INDEPENDENT_AMBULATORY_CARE_PROVIDER_SITE_OTHER): Payer: Medicare Other | Admitting: Family Medicine

## 2011-12-23 ENCOUNTER — Encounter: Payer: Self-pay | Admitting: Family Medicine

## 2011-12-23 VITALS — BP 136/84 | HR 84 | Temp 97.9°F | Wt 171.0 lb

## 2011-12-23 DIAGNOSIS — R7309 Other abnormal glucose: Secondary | ICD-10-CM | POA: Diagnosis not present

## 2011-12-23 DIAGNOSIS — R739 Hyperglycemia, unspecified: Secondary | ICD-10-CM

## 2011-12-23 DIAGNOSIS — E785 Hyperlipidemia, unspecified: Secondary | ICD-10-CM

## 2011-12-23 DIAGNOSIS — I10 Essential (primary) hypertension: Secondary | ICD-10-CM

## 2011-12-23 LAB — COMPREHENSIVE METABOLIC PANEL
ALT: 22 U/L (ref 0–35)
AST: 19 U/L (ref 0–37)
BUN: 20 mg/dL (ref 6–23)
Calcium: 9.8 mg/dL (ref 8.4–10.5)
Creatinine, Ser: 1.2 mg/dL (ref 0.4–1.2)
GFR: 57.29 mL/min — ABNORMAL LOW (ref >60.00)
Total Bilirubin: 0.5 mg/dL (ref 0.3–1.2)

## 2011-12-23 LAB — LIPID PANEL
HDL: 59.2 mg/dL (ref >39.00)
Total CHOL/HDL Ratio: 2
Triglycerides: 99 mg/dL (ref 0.0–149.0)
VLDL: 19.8 mg/dL (ref 0.0–40.0)

## 2011-12-23 LAB — HEMOGLOBIN A1C: Hgb A1c MFr Bld: 6.4 % (ref 4.6–6.5)

## 2011-12-23 NOTE — Patient Instructions (Addendum)
Good to see you. I do not think you have diabetes but we will check today. I'm glad you're feeling well.
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
## 2012-01-17 DIAGNOSIS — H4010X0 Unspecified open-angle glaucoma, stage unspecified: Secondary | ICD-10-CM | POA: Diagnosis not present

## 2012-02-14 DIAGNOSIS — H4010X0 Unspecified open-angle glaucoma, stage unspecified: Secondary | ICD-10-CM | POA: Diagnosis not present

## 2012-02-21 DIAGNOSIS — M1710 Unilateral primary osteoarthritis, unspecified knee: Secondary | ICD-10-CM | POA: Diagnosis not present

## 2012-02-24 ENCOUNTER — Encounter: Payer: Self-pay | Admitting: Family Medicine

## 2012-02-24 ENCOUNTER — Ambulatory Visit (INDEPENDENT_AMBULATORY_CARE_PROVIDER_SITE_OTHER): Payer: Medicare Other | Admitting: Family Medicine

## 2012-02-24 VITALS — BP 114/72 | HR 72 | Temp 97.7°F | Wt 168.0 lb

## 2012-02-24 DIAGNOSIS — Z029 Encounter for administrative examinations, unspecified: Secondary | ICD-10-CM | POA: Diagnosis not present

## 2012-02-24 DIAGNOSIS — Z23 Encounter for immunization: Secondary | ICD-10-CM
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
## 2012-03-23 ENCOUNTER — Other Ambulatory Visit: Payer: Medicare Other

## 2012-04-07 DIAGNOSIS — M79609 Pain in unspecified limb: Secondary | ICD-10-CM | POA: Diagnosis not present

## 2012-04-07 DIAGNOSIS — B351 Tinea unguium: Secondary | ICD-10-CM | POA: Diagnosis not present

## 2012-05-08 ENCOUNTER — Other Ambulatory Visit: Payer: Self-pay | Admitting: Family Medicine

## 2012-06-15 DIAGNOSIS — H4010X0 Unspecified open-angle glaucoma, stage unspecified: Secondary | ICD-10-CM | POA: Diagnosis not present

## 2012-06-15 DIAGNOSIS — E109 Type 1 diabetes mellitus without complications: Secondary | ICD-10-CM | POA: Diagnosis not present

## 2012-06-23 DIAGNOSIS — Z124 Encounter for screening for malignant neoplasm of cervix: Secondary | ICD-10-CM | POA: Diagnosis not present

## 2012-06-23 DIAGNOSIS — Z01419 Encounter for gynecological examination (general) (routine) without abnormal findings: Secondary | ICD-10-CM | POA: Diagnosis not present

## 2012-06-23 DIAGNOSIS — Z13820 Encounter for screening for osteoporosis: Secondary | ICD-10-CM | POA: Diagnosis not present

## 2012-08-12 ENCOUNTER — Other Ambulatory Visit: Payer: Self-pay | Admitting: Family Medicine

## 2012-08-14 NOTE — Telephone Encounter (Signed)
Opened in error

## 2012-08-15 ENCOUNTER — Other Ambulatory Visit: Payer: Self-pay | Admitting: Family Medicine

## 2012-08-15 MED ORDER — VALSARTAN-HYDROCHLOROTHIAZIDE 160-12.5 MG PO TABS: ORAL_TABLET | ORAL | Status: DC

## 2012-08-15 NOTE — Addendum Note (Signed)
Addended by: Eliezer Bottom on: 08/15/2012 08:40 AM   Modules accepted: Orders

## 2012-09-26 ENCOUNTER — Other Ambulatory Visit: Payer: Self-pay | Admitting: Family Medicine

## 2012-09-26 DIAGNOSIS — Z1231 Encounter for screening mammogram for malignant neoplasm of breast: Secondary | ICD-10-CM

## 2012-10-27 ENCOUNTER — Ambulatory Visit (HOSPITAL_COMMUNITY)
Admission: RE | Admit: 2012-10-27 | Discharge: 2012-10-27 | Disposition: A | Discharge status: Home / Self Care | Payer: Medicare Other | Source: Ambulatory Visit | Attending: Family Medicine | Admitting: Family Medicine

## 2012-10-27 DIAGNOSIS — Z1231 Encounter for screening mammogram for malignant neoplasm of breast: Secondary | ICD-10-CM | POA: Diagnosis not present

## 2012-11-07 ENCOUNTER — Ambulatory Visit (INDEPENDENT_AMBULATORY_CARE_PROVIDER_SITE_OTHER): Payer: Medicare Other | Admitting: Family Medicine

## 2012-11-07 ENCOUNTER — Encounter: Payer: Self-pay | Admitting: Family Medicine

## 2012-11-07 VITALS — BP 132/80 | HR 87 | Temp 97.9°F | Wt 168.0 lb

## 2012-11-07 DIAGNOSIS — R42 Dizziness and giddiness: Secondary | ICD-10-CM | POA: Diagnosis not present
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
## 2012-11-17 ENCOUNTER — Other Ambulatory Visit: Payer: Self-pay | Admitting: Family Medicine

## 2012-11-17 MED ORDER — AMLODIPINE BESYLATE 10 MG PO TABS: ORAL_TABLET | ORAL | Status: DC

## 2012-11-17 MED ORDER — ROSUVASTATIN CALCIUM 5 MG PO TABS: ORAL_TABLET | ORAL | Status: DC

## 2012-12-17 ENCOUNTER — Other Ambulatory Visit: Payer: Self-pay | Admitting: Family Medicine

## 2012-12-18 MED ORDER — VALSARTAN-HYDROCHLOROTHIAZIDE 160-12.5 MG PO TABS: ORAL_TABLET | ORAL | Status: DC

## 2012-12-18 NOTE — Addendum Note (Signed)
Addended by: Eliezer Bottom on: 12/18/2012 09:07 AM   Modules accepted: Orders

## 2012-12-19 MED ORDER — VALSARTAN-HYDROCHLOROTHIAZIDE 160-12.5 MG PO TABS: ORAL_TABLET | ORAL | Status: DC

## 2012-12-19 NOTE — Addendum Note (Signed)
Addended by: Annamarie Major on: 12/19/2012 09:01 AM   Modules accepted: Orders

## 2013-02-17 DIAGNOSIS — E7800 Pure hypercholesterolemia, unspecified: Secondary | ICD-10-CM | POA: Diagnosis not present

## 2013-02-17 DIAGNOSIS — E119 Type 2 diabetes mellitus without complications: Secondary | ICD-10-CM | POA: Diagnosis not present

## 2013-02-17 DIAGNOSIS — T1490XA Injury, unspecified, initial encounter: Secondary | ICD-10-CM | POA: Diagnosis not present

## 2013-02-17 DIAGNOSIS — S0993XA Unspecified injury of face, initial encounter: Secondary | ICD-10-CM | POA: Diagnosis not present

## 2013-02-17 DIAGNOSIS — S0003XA Contusion of scalp, initial encounter: Secondary | ICD-10-CM | POA: Diagnosis not present

## 2013-02-17 DIAGNOSIS — M129 Arthropathy, unspecified: Secondary | ICD-10-CM | POA: Diagnosis not present

## 2013-02-17 DIAGNOSIS — W108XXA Fall (on) (from) other stairs and steps, initial encounter: Secondary | ICD-10-CM | POA: Diagnosis not present

## 2013-02-17 DIAGNOSIS — I10 Essential (primary) hypertension: Secondary | ICD-10-CM | POA: Diagnosis not present

## 2013-02-17 DIAGNOSIS — S0990XA Unspecified injury of head, initial encounter: Secondary | ICD-10-CM | POA: Diagnosis not present

## 2013-02-24 ENCOUNTER — Other Ambulatory Visit: Payer: Self-pay | Admitting: Family Medicine

## 2013-03-15 ENCOUNTER — Ambulatory Visit (INDEPENDENT_AMBULATORY_CARE_PROVIDER_SITE_OTHER): Payer: Medicare Other | Admitting: Family Medicine

## 2013-03-15 ENCOUNTER — Encounter: Payer: Self-pay | Admitting: Family Medicine

## 2013-03-15 VITALS — BP 138/70 | HR 86 | Temp 98.4°F | Wt 167.0 lb

## 2013-03-15 DIAGNOSIS — I10 Essential (primary) hypertension: Secondary | ICD-10-CM | POA: Diagnosis not present

## 2013-03-15 DIAGNOSIS — E785 Hyperlipidemia, unspecified: Secondary | ICD-10-CM | POA: Diagnosis not present

## 2013-03-15 DIAGNOSIS — R739 Hyperglycemia, unspecified: Secondary | ICD-10-CM

## 2013-03-15 DIAGNOSIS — Z23 Encounter for immunization: Secondary | ICD-10-CM

## 2013-03-15 DIAGNOSIS — R7309 Other abnormal glucose: Secondary | ICD-10-CM

## 2013-03-15 LAB — COMPREHENSIVE METABOLIC PANEL
ALT: 24 U/L (ref 0–35)
AST: 20 U/L (ref 0–37)
CO2: 27 mEq/L (ref 19–32)
Calcium: 9.9 mg/dL (ref 8.4–10.5)
Chloride: 106 mEq/L (ref 96–112)
GFR: 55.47 mL/min — ABNORMAL LOW (ref >60.00)
Sodium: 143 mEq/L (ref 135–145)
Total Bilirubin: 0.8 mg/dL (ref 0.3–1.2)
Total Protein: 7.5 g/dL (ref 6.0–8.3)

## 2013-03-15 LAB — LIPID PANEL
LDL Cholesterol: 65 mg/dL (ref 0–99)
Total CHOL/HDL Ratio: 2

## 2013-03-15 MED ORDER — ROSUVASTATIN CALCIUM 5 MG PO TABS: ORAL_TABLET | ORAL | Status: DC

## 2013-03-15 MED ORDER — AMLODIPINE BESYLATE 10 MG PO TABS: ORAL_TABLET | ORAL | Status: DC

## 2013-03-15 MED ORDER — VALSARTAN-HYDROCHLOROTHIAZIDE 160-12.5 MG PO TABS: ORAL_TABLET | ORAL | Status: DC

## 2013-03-15 NOTE — Patient Instructions (Signed)
Great to see you.  We will call you with your lab results. 
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
## 2013-03-20 ENCOUNTER — Encounter: Payer: Self-pay | Admitting: Family Medicine

## 2013-04-27 ENCOUNTER — Ambulatory Visit (INDEPENDENT_AMBULATORY_CARE_PROVIDER_SITE_OTHER): Payer: Medicare Other | Admitting: Internal Medicine

## 2013-04-27 ENCOUNTER — Encounter: Payer: Self-pay | Admitting: Internal Medicine

## 2013-04-27 VITALS — BP 132/80 | HR 81 | Temp 98.2°F | Wt 163.8 lb

## 2013-04-27 DIAGNOSIS — N6320 Unspecified lump in the left breast, unspecified quadrant: Secondary | ICD-10-CM

## 2013-04-27 DIAGNOSIS — IMO0002 Reserved for concepts with insufficient information to code with codable children: Secondary | ICD-10-CM | POA: Diagnosis not present

## 2013-04-27 DIAGNOSIS — IMO0001 Reserved for inherently not codable concepts without codable children: Secondary | ICD-10-CM | POA: Diagnosis not present

## 2013-04-27 DIAGNOSIS — T7491XA Unspecified adult maltreatment, confirmed, initial encounter: Secondary | ICD-10-CM

## 2013-04-27 DIAGNOSIS — N63 Unspecified lump in unspecified breast: Secondary | ICD-10-CM

## 2013-04-27 DIAGNOSIS — Y92009 Unspecified place in unspecified non-institutional (private) residence as the place of occurrence of the external cause: Secondary | ICD-10-CM

## 2013-04-27 DIAGNOSIS — S299XXA Unspecified injury of thorax, initial encounter: Secondary | ICD-10-CM

## 2013-04-27 DIAGNOSIS — W19XXXA Unspecified fall, initial encounter: Secondary | ICD-10-CM

## 2013-04-27 DIAGNOSIS — M791 Myalgia, unspecified site: Secondary | ICD-10-CM

## 2013-04-27 MED ORDER — CYCLOBENZAPRINE HCL 10 MG PO TABS: 10.0000 mg | ORAL_TABLET | Freq: Three times a day (TID) | ORAL | Status: DC | PRN
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
## 2013-04-27 NOTE — Patient Instructions (Signed)

## 2013-04-27 NOTE — Addendum Note (Signed)
Addended by: Lorre Munroe on: 04/27/2013 02:33 PM   Modules accepted: Orders

## 2013-05-16 ENCOUNTER — Ambulatory Visit
Admission: RE | Admit: 2013-05-16 | Discharge: 2013-05-16 | Disposition: A | Discharge status: Home / Self Care | Payer: Medicare Other | Source: Ambulatory Visit | Attending: Internal Medicine | Admitting: Internal Medicine

## 2013-05-16 ENCOUNTER — Other Ambulatory Visit: Payer: Self-pay | Admitting: Internal Medicine

## 2013-05-16 DIAGNOSIS — N6489 Other specified disorders of breast: Secondary | ICD-10-CM | POA: Diagnosis not present

## 2013-05-16 DIAGNOSIS — N6320 Unspecified lump in the left breast, unspecified quadrant: Secondary | ICD-10-CM

## 2013-06-20 ENCOUNTER — Ambulatory Visit
Admission: RE | Admit: 2013-06-20 | Discharge: 2013-06-20 | Disposition: A | Discharge status: Home / Self Care | Payer: Medicare Other | Source: Ambulatory Visit | Attending: Internal Medicine | Admitting: Internal Medicine

## 2013-06-20 DIAGNOSIS — N6489 Other specified disorders of breast: Secondary | ICD-10-CM | POA: Diagnosis not present

## 2013-06-20 DIAGNOSIS — N6320 Unspecified lump in the left breast, unspecified quadrant: Secondary | ICD-10-CM

## 2013-07-05 DIAGNOSIS — H4011X0 Primary open-angle glaucoma, stage unspecified: Secondary | ICD-10-CM | POA: Diagnosis not present

## 2013-07-05 DIAGNOSIS — H43399 Other vitreous opacities, unspecified eye: Secondary | ICD-10-CM | POA: Diagnosis not present

## 2013-07-06 DIAGNOSIS — M169 Osteoarthritis of hip, unspecified: Secondary | ICD-10-CM | POA: Diagnosis not present

## 2013-07-06 DIAGNOSIS — M1610 Unilateral primary osteoarthritis, unspecified hip: Secondary | ICD-10-CM | POA: Diagnosis not present

## 2013-08-06 ENCOUNTER — Telehealth: Payer: Self-pay | Admitting: Family Medicine

## 2013-08-06 NOTE — Telephone Encounter (Signed)
Pt dropped off DMV forms to be completed by Dr. [name].

## 2013-08-06 NOTE — Telephone Encounter (Signed)
Paperwork given to Dr [name] for completion

## 2013-08-13 ENCOUNTER — Encounter: Payer: Self-pay | Admitting: Family Medicine

## 2013-08-13 ENCOUNTER — Ambulatory Visit (INDEPENDENT_AMBULATORY_CARE_PROVIDER_SITE_OTHER): Payer: Medicare Other | Admitting: Family Medicine

## 2013-08-13 VITALS — BP 122/80 | HR 84 | Temp 98.1°F | Wt 166.5 lb

## 2013-08-13 DIAGNOSIS — Z9189 Other specified personal risk factors, not elsewhere classified: Secondary | ICD-10-CM

## 2013-08-13 NOTE — Progress Notes (Signed)
Pre visit review using our clinic review tool, if applicable. No additional management support is needed unless otherwise documented below in the visit note. 
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
## 2013-09-17 ENCOUNTER — Other Ambulatory Visit: Payer: Self-pay | Admitting: Family Medicine

## 2013-09-17 DIAGNOSIS — N6489 Other specified disorders of breast: Secondary | ICD-10-CM

## 2013-10-30 ENCOUNTER — Ambulatory Visit
Admission: RE | Admit: 2013-10-30 | Discharge: 2013-10-30 | Disposition: A | Discharge status: Home / Self Care | Payer: Medicare Other | Source: Ambulatory Visit | Attending: Family Medicine | Admitting: Family Medicine

## 2013-10-30 DIAGNOSIS — N6489 Other specified disorders of breast: Secondary | ICD-10-CM

## 2013-10-30 DIAGNOSIS — N641 Fat necrosis of breast: Secondary | ICD-10-CM | POA: Diagnosis not present

## 2014-01-28 ENCOUNTER — Other Ambulatory Visit: Payer: Self-pay | Admitting: *Deleted

## 2014-01-28 MED ORDER — VALSARTAN-HYDROCHLOROTHIAZIDE 160-12.5 MG PO TABS: ORAL_TABLET | ORAL | Status: DC

## 2014-01-28 NOTE — Telephone Encounter (Signed)
Received faxed refill request from pharmacy. Refill sent to pharmacy electronically. 

## 2014-03-11 DIAGNOSIS — M169 Osteoarthritis of hip, unspecified: Secondary | ICD-10-CM | POA: Diagnosis not present

## 2014-03-13 ENCOUNTER — Encounter: Payer: Self-pay | Admitting: Family Medicine

## 2014-03-13 ENCOUNTER — Ambulatory Visit (INDEPENDENT_AMBULATORY_CARE_PROVIDER_SITE_OTHER): Payer: Medicare Other | Admitting: Family Medicine

## 2014-03-13 VITALS — BP 138/78 | HR 78 | Temp 97.9°F | Wt 158.0 lb

## 2014-03-13 DIAGNOSIS — E785 Hyperlipidemia, unspecified: Secondary | ICD-10-CM | POA: Diagnosis not present

## 2014-03-13 DIAGNOSIS — Z23 Encounter for immunization: Secondary | ICD-10-CM

## 2014-03-13 DIAGNOSIS — Z79899 Other long term (current) drug therapy: Secondary | ICD-10-CM | POA: Diagnosis not present

## 2014-03-13 DIAGNOSIS — I10 Essential (primary) hypertension: Secondary | ICD-10-CM | POA: Diagnosis not present

## 2014-03-13 NOTE — Patient Instructions (Signed)
Good to see you. We will call you with your lab results.   
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
## 2014-03-13 NOTE — Assessment & Plan Note (Signed)
Has been well controlled but she would like recheck today. Orders placed. Orders Placed This Encounter  Procedures  . Flu Vaccine QUAD 36+ mos PF IM (Fluarix Quad PF)  . Pneumococcal conjugate vaccine 13-valent IM  . Hemoglobin A1c  . Lipid panel  . Comprehensive metabolic panel

## 2014-03-13 NOTE — Progress Notes (Signed)
Pre visit review using our clinic review tool, if applicable. No additional management support is needed unless otherwise documented below in the visit note. 

## 2014-03-13 NOTE — Assessment & Plan Note (Signed)
Well controlled on current rx. No changes made. 

## 2014-03-14 ENCOUNTER — Telehealth: Payer: Self-pay | Admitting: Family Medicine

## 2014-03-14 ENCOUNTER — Other Ambulatory Visit: Payer: Medicare Other

## 2014-03-14 LAB — COMPREHENSIVE METABOLIC PANEL
ALK PHOS: 68 U/L (ref 39–117)
ALT: 16 U/L (ref 0–35)
AST: 21 U/L (ref 0–37)
Albumin: 3.7 g/dL (ref 3.5–5.2)
BILIRUBIN TOTAL: 0.8 mg/dL (ref 0.2–1.2)
BUN: 22 mg/dL (ref 6–23)
CO2: 28 mEq/L (ref 19–32)
Calcium: 9.9 mg/dL (ref 8.4–10.5)
Chloride: 106 mEq/L (ref 96–112)
Creatinine, Ser: 1.3 mg/dL — ABNORMAL HIGH (ref 0.4–1.2)
GFR: 51.41 mL/min — ABNORMAL LOW (ref >60.00)
GLUCOSE: 117 mg/dL — AB (ref 70–99)
Potassium: 3.4 mEq/L — ABNORMAL LOW (ref 3.5–5.1)
Sodium: 142 mEq/L (ref 135–145)
Total Protein: 7.9 g/dL (ref 6.0–8.3)

## 2014-03-14 LAB — LIPID PANEL
CHOL/HDL RATIO: 3
CHOLESTEROL: 134 mg/dL (ref 0–200)
HDL: 49.3 mg/dL (ref >39.00)
LDL Cholesterol: 59 mg/dL (ref 0–99)
NonHDL: 84.7
Triglycerides: 129 mg/dL (ref 0.0–149.0)
VLDL: 25.8 mg/dL (ref 0.0–40.0)

## 2014-03-14 LAB — HEMOGLOBIN A1C: Hgb A1c MFr Bld: 5.8 % (ref 4.6–6.5)

## 2014-03-14 NOTE — Telephone Encounter (Signed)
emmi mailed  °

## 2014-03-19 ENCOUNTER — Telehealth: Payer: Self-pay | Admitting: Family Medicine

## 2014-03-19 MED ORDER — ROSUVASTATIN CALCIUM 5 MG PO TABS: ORAL_TABLET | ORAL | Status: DC

## 2014-03-19 NOTE — Telephone Encounter (Signed)
Left voicemail with my cell phone number for her daughter to return my call.

## 2014-03-19 NOTE — Telephone Encounter (Signed)
Spoke to pt and advised her that unfortunately her [place]. Pt is unaware of what daughter was wanting. She states that she will come to office and sign DPR and have daughter to call back

## 2014-03-19 NOTE — Telephone Encounter (Signed)
Patient's daughter is calling to discuss her mother's medical condition and regimen.

## 2014-03-19 NOTE — Telephone Encounter (Signed)
No, I dont show that there is a DPR on file for this pt.

## 2014-03-19 NOTE — Telephone Encounter (Signed)
Pt came in with daughter Dellis FilbertKatrina Wassenaar and signed DPR so that we may speak with her about her medical record. Katrina is only in town for today and tomorrow and really would like a call back regarding her mothers medical condition.  Katrina call back # [phone]

## 2014-03-19 NOTE — Telephone Encounter (Signed)
Please look into this- did she sign a release which would allow me to talk with her?  I do not see that in Epic.

## 2014-03-19 NOTE — Addendum Note (Signed)
Addended by: Dianne DunARON, TALIA M on: 03/19/2014 03:30 PM   Modules accepted: Orders

## 2014-03-19 NOTE — Telephone Encounter (Signed)
I unfortunately cannot speak with her regarding her mom's medical condition.

## 2014-04-09 ENCOUNTER — Other Ambulatory Visit: Payer: Self-pay | Admitting: Family Medicine

## 2014-04-27 ENCOUNTER — Other Ambulatory Visit: Payer: Self-pay | Admitting: Family Medicine

## 2014-07-22 ENCOUNTER — Other Ambulatory Visit: Payer: Self-pay | Admitting: Family Medicine

## 2014-08-20 ENCOUNTER — Encounter: Payer: Self-pay | Admitting: Family Medicine

## 2014-08-20 ENCOUNTER — Ambulatory Visit (INDEPENDENT_AMBULATORY_CARE_PROVIDER_SITE_OTHER): Payer: Medicare Other | Admitting: Family Medicine

## 2014-08-20 VITALS — BP 140/80 | HR 79 | Temp 98.1°F | Wt 149.0 lb

## 2014-08-20 DIAGNOSIS — R634 Abnormal weight loss: Secondary | ICD-10-CM

## 2014-08-20 DIAGNOSIS — Z1211 Encounter for screening for malignant neoplasm of colon: Secondary | ICD-10-CM

## 2014-08-20 LAB — CBC WITH DIFFERENTIAL/PLATELET
BASOS ABS: 0 10*3/uL (ref 0.0–0.1)
Basophils Relative: 0.5 % (ref 0.0–3.0)
Eosinophils Absolute: 0.2 10*3/uL (ref 0.0–0.7)
Eosinophils Relative: 3 % (ref 0.0–5.0)
HEMATOCRIT: 37.9 % (ref 36.0–46.0)
Hemoglobin: 12.4 g/dL (ref 12.0–15.0)
LYMPHS ABS: 1.9 10*3/uL (ref 0.7–4.0)
Lymphocytes Relative: 30.3 % (ref 12.0–46.0)
MCHC: 32.7 g/dL (ref 30.0–36.0)
MCV: 89 fl (ref 78.0–100.0)
Monocytes Absolute: 0.4 10*3/uL (ref 0.1–1.0)
Monocytes Relative: 6.9 % (ref 3.0–12.0)
Neutro Abs: 3.7 10*3/uL (ref 1.4–7.7)
Neutrophils Relative %: 59.3 % (ref 43.0–77.0)
Platelets: 212 10*3/uL (ref 150.0–400.0)
RBC: 4.26 Mil/uL (ref 3.87–5.11)
RDW: 14.5 % (ref 11.5–15.5)
WBC: 6.3 10*3/uL (ref 4.0–10.5)

## 2014-08-20 LAB — COMPREHENSIVE METABOLIC PANEL
ALT: 15 U/L (ref 0–35)
AST: 18 U/L (ref 0–37)
Albumin: 4.2 g/dL (ref 3.5–5.2)
Alkaline Phosphatase: 64 U/L (ref 39–117)
BILIRUBIN TOTAL: 0.4 mg/dL (ref 0.2–1.2)
BUN: 17 mg/dL (ref 6–23)
CALCIUM: 10 mg/dL (ref 8.4–10.5)
CO2: 31 mEq/L (ref 19–32)
CREATININE: 1.28 mg/dL — AB (ref 0.40–1.20)
Chloride: 108 mEq/L (ref 96–112)
GFR: 52.28 mL/min — AB (ref >60.00)
Glucose, Bld: 89 mg/dL (ref 70–99)
Potassium: 3.6 mEq/L (ref 3.5–5.1)
SODIUM: 143 meq/L (ref 135–145)
Total Protein: 7.1 g/dL (ref 6.0–8.3)

## 2014-08-20 LAB — TSH: TSH: 1.01 u[IU]/mL (ref 0.35–4.50)

## 2014-08-20 NOTE — Patient Instructions (Signed)
Good to see you. We will call you with your results from today. If you develop any new symptoms, please let me know right away.

## 2014-08-20 NOTE — Progress Notes (Signed)
Subjective:   Patient ID: Sherri [name], female    DOB: [birth year], 74 y.o.   MRN: [id number]  Sherri [name] is a pleasant 74 y.o. year old female who presents to clinic today with Rash  on 08/20/2014  HPI:  Here for rash but there is no rash evident.  She is showing me lose skin hanging from her arms bilaterally- there have been no lesions or rashes on this skin but she is concerned that the skin seems be so lose there.  Upon questioning, she had had decreased appetite over past several months. Weight is down. Wt Readings from Last 3 Encounters:  08/20/14 149 lb (67.586 kg)  03/13/14 158 lb (71.668 kg)  08/13/13 166 lb 8 oz (75.524 kg)   Denies any abdominal pain, early satiety or blood in her stool. Denies feeling depressed or anxious.  Denies nausea or vomiting.  Per our system, last colonoscopy was in 2010 but I could not find actual report.    Hepatic Function Latest Ref Rng 03/14/2014 03/15/2013 12/23/2011  Total Protein 6.0 - 8.3 g/dL 7.9 7.5 7.5  Albumin 3.5 - 5.2 g/dL 3.7 4.1 4.1  AST 0 - 37 U/L _0 ALT 0 - 35 U/L _1 Alk Phosphatase 39 - 117 U/L 68 60 68  Total Bilirubin 0.2 - 1.2 mg/dL 0.8 0.8 0.5   Lab Results  Component Value Date   WBC 6.5 10/13/2010   HGB 13.2 10/13/2010   HCT 38.9 10/13/2010   MCV 86.6 10/13/2010   PLT 186 10/13/2010   No results found for: TSH  Current Outpatient Prescriptions on File Prior to Visit  Medication Sig Dispense Refill  . amLODipine (NORVASC) 10 MG tablet TAKE 1 TABLET DAILY 90 tablet 1  . aspirin (BAYER ASPIRIN) 325 MG tablet Take 325 mg by mouth daily.      . calcium citrate-vitamin D 200-200 MG-UNIT TABS Take 1 tablet by mouth daily.      . cyclobenzaprine (FLEXERIL) 10 MG tablet Take 1 tablet (10 mg total) by mouth 3 (three) times daily as needed for muscle spasms. 30 tablet 0  . FIBER PO Take one half tablet by mouth dailyl     . Omega-3 Fatty Acids (FISH OIL) 1000 MG CAPS Take 1 capsule by mouth  daily.      . rosuvastatin (CRESTOR) 5 MG tablet TAKE 1 TABLET DAILY 90 tablet 3  . valsartan-hydrochlorothiazide (DIOVAN-HCT) 160-12.5 MG per tablet TAKE 1 TABLET DAILY 90 tablet 1   No current facility-administered medications on file prior to visit.    Allergies  Allergen Reactions  . Hydrogen Peroxide Swelling    Past Medical History  Diagnosis Date  . Hyperlipidemia   . Hypertension     No past surgical history on file.  No family history on file.  History   Social History  . Marital Status: Married    Spouse Name: N/A  . Number of Children: N/A  . Years of Education: N/A   Occupational History  . Not on file.   Social History Main Topics  . Smoking status: Never Smoker   . Smokeless tobacco: Never Used  . Alcohol Use: No  . Drug Use: No  . Sexual Activity: Not on file   Other Topics Concern  . Not on file   Social History Narrative   The PMH, PSH, Social History, Family History, Medications, and allergies have been reviewed in [facility], and have been updated if relevant.  Review of Systems  Constitutional: Positive for appetite change and unexpected weight change.  HENT: Negative.   Respiratory: Negative.   Cardiovascular: Negative.   Gastrointestinal: Negative.   Endocrine: Negative.   Genitourinary: Negative.   Musculoskeletal: Negative.   Skin: Negative.   Allergic/Immunologic: Negative.   Neurological: Negative.   Hematological: Negative.   Psychiatric/Behavioral: Negative.   All other systems reviewed and are negative.      Objective:    BP 140/80 mmHg  Pulse 79  Temp(Src) 98.1 F (36.7 C) (Oral)  Wt 149 lb (67.586 kg)  SpO2 98% Wt Readings from Last 3 Encounters:  08/20/14 149 lb (67.586 kg)  03/13/14 158 lb (71.668 kg)  08/13/13 166 lb 8 oz (75.524 kg)     Physical Exam  Constitutional: She is oriented to person, place, and time. She appears well-developed and well-nourished. No distress.  Clothes do seem to be loose on her    HENT:  Head: Normocephalic.  Eyes: Conjunctivae are normal.  Neck: Normal range of motion.  Cardiovascular: Normal rate and regular rhythm.   Pulmonary/Chest: Effort normal and breath sounds normal. No respiratory distress.  Abdominal: Soft. She exhibits no distension and no mass. There is no tenderness. There is no rebound and no guarding.  Musculoskeletal:       Arms: Neurological: She is alert and oriented to person, place, and time. No cranial nerve deficit.  Skin: Skin is warm and dry. No rash noted.  Psychiatric: She has a normal mood and affect. Her behavior is normal. Judgment and thought content normal.  Nursing note and vitals reviewed.         Assessment & Plan:   Loss of weight - Plan: CBC with Differential/Platelet, TSH, Comprehensive metabolic panel, Fecal occult blood, imunochemical  Special screening for malignant neoplasms, colon - Plan: Fecal occult blood, imunochemical No Follow-up on file.

## 2014-08-20 NOTE — Assessment & Plan Note (Signed)
New- unintentional.  No signs or symptoms of rash but I am concerned about her weight loss. Will start out with labs and stool cards as she is denying any other symptoms. Also denying depression. May need GI referral-  The patient indicates understanding of these issues and agrees with the plan.

## 2014-08-20 NOTE — Addendum Note (Signed)
Addended by: Alvina ChouWALSH, Darral Rishel J on: 08/20/2014 12:08 PM   Modules accepted: Orders

## 2014-08-20 NOTE — Progress Notes (Signed)
Pre visit review using our clinic review tool, if applicable. No additional management support is needed unless otherwise documented below in the visit note. 

## 2014-08-26 ENCOUNTER — Other Ambulatory Visit (INDEPENDENT_AMBULATORY_CARE_PROVIDER_SITE_OTHER): Payer: Medicare Other

## 2014-08-26 DIAGNOSIS — Z1211 Encounter for screening for malignant neoplasm of colon: Secondary | ICD-10-CM

## 2014-08-26 LAB — FECAL OCCULT BLOOD, IMMUNOCHEMICAL: Fecal Occult Bld: NEGATIVE

## 2014-09-16 ENCOUNTER — Encounter: Payer: Self-pay | Admitting: Family Medicine

## 2014-09-16 ENCOUNTER — Ambulatory Visit (INDEPENDENT_AMBULATORY_CARE_PROVIDER_SITE_OTHER): Payer: Medicare Other | Admitting: Family Medicine

## 2014-09-16 ENCOUNTER — Telehealth: Payer: Self-pay

## 2014-09-16 VITALS — BP 114/72 | HR 78 | Temp 98.0°F | Resp 18 | Wt 147.8 lb

## 2014-09-16 DIAGNOSIS — Z658 Other specified problems related to psychosocial circumstances: Secondary | ICD-10-CM

## 2014-09-16 DIAGNOSIS — Z638 Other specified problems related to primary support group: Secondary | ICD-10-CM | POA: Diagnosis not present

## 2014-09-16 DIAGNOSIS — F4323 Adjustment disorder with mixed anxiety and depressed mood: Secondary | ICD-10-CM | POA: Insufficient documentation

## 2014-09-16 DIAGNOSIS — F439 Reaction to severe stress, unspecified: Secondary | ICD-10-CM | POA: Insufficient documentation

## 2014-09-16 DIAGNOSIS — R634 Abnormal weight loss: Secondary | ICD-10-CM

## 2014-09-16 MED ORDER — MIRTAZAPINE 7.5 MG PO TABS: 7.5000 mg | ORAL_TABLET | Freq: Every day | ORAL | Status: DC
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
## 2014-09-16 NOTE — Telephone Encounter (Signed)
Pt left note requesting status of mirtazipine to [facility] [address]; Unable to reach pt by phone; [facility]; i sent rx to mirtazapine to [facility] [address] as requested; if [facility] needs to be notified to cancel rx please f/u.

## 2014-09-16 NOTE — Patient Instructions (Signed)
Good to see you, [name]. We are starting Remeron 7.5 mg nightly. Please come see me in one month, sooner if your symptoms do not improve.  We will call you with an appointment to speak with one of our therapists.
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
## 2014-09-19 ENCOUNTER — Other Ambulatory Visit: Payer: Self-pay | Admitting: Family Medicine

## 2014-09-19 DIAGNOSIS — Z1231 Encounter for screening mammogram for malignant neoplasm of breast: Secondary | ICD-10-CM

## 2014-10-10 ENCOUNTER — Ambulatory Visit: Payer: BC Managed Care – PPO | Admitting: Psychology

## 2014-11-05 ENCOUNTER — Encounter: Payer: Self-pay | Admitting: Family Medicine

## 2014-11-05 ENCOUNTER — Ambulatory Visit (INDEPENDENT_AMBULATORY_CARE_PROVIDER_SITE_OTHER): Payer: Medicare Other | Admitting: Family Medicine

## 2014-11-05 VITALS — BP 114/70 | HR 87 | Temp 98.3°F | Wt 145.8 lb

## 2014-11-05 DIAGNOSIS — F439 Reaction to severe stress, unspecified: Secondary | ICD-10-CM

## 2014-11-05 DIAGNOSIS — R634 Abnormal weight loss: Secondary | ICD-10-CM

## 2014-11-05 DIAGNOSIS — Z638 Other specified problems related to primary support group: Secondary | ICD-10-CM | POA: Diagnosis not present

## 2014-11-05 MED ORDER — MIRTAZAPINE 15 MG PO TABS: 15.0000 mg | ORAL_TABLET | Freq: Every day | ORAL | Status: DC

## 2014-11-05 NOTE — Patient Instructions (Signed)
Good to see you. We are increasing your Mirtazapine to 15 mg nightly. Please follow up with me in 1 month.

## 2014-11-05 NOTE — Assessment & Plan Note (Signed)
>  25 minutes spent in face to face time with patient, >50% spent in counselling or coordination of care Since no other red flag symptoms or studies, will attempt to increase remeron to 15 mg qhs as I feel a lot of this is due to her stressors at home. The patient indicates understanding of these issues and agrees with the plan. Follow up in 1 month.
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
## 2014-11-05 NOTE — Progress Notes (Signed)
Pre visit review using our clinic review tool, if applicable. No additional management support is needed unless otherwise documented below in the visit note. 

## 2014-11-11 ENCOUNTER — Ambulatory Visit (HOSPITAL_COMMUNITY): Payer: Medicare Other

## 2014-11-13 ENCOUNTER — Ambulatory Visit (HOSPITAL_COMMUNITY)
Admission: RE | Admit: 2014-11-13 | Discharge: 2014-11-13 | Disposition: A | Discharge status: Home / Self Care | Payer: Medicare Other | Source: Ambulatory Visit | Attending: Family Medicine | Admitting: Family Medicine

## 2014-11-13 DIAGNOSIS — Z1231 Encounter for screening mammogram for malignant neoplasm of breast: Secondary | ICD-10-CM | POA: Insufficient documentation

## 2014-11-22 ENCOUNTER — Other Ambulatory Visit: Payer: Self-pay | Admitting: Family Medicine

## 2014-12-06 ENCOUNTER — Telehealth: Payer: Self-pay | Admitting: Family Medicine

## 2014-12-06 NOTE — Telephone Encounter (Signed)
If he has concerns, why doesn't he go ahead and make a follow up appt with Dr. [name] for next Thursday July 7th. He could also go through the ER if things get really bad.
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
## 2014-12-10 ENCOUNTER — Ambulatory Visit: Payer: BC Managed Care – PPO | Admitting: Family Medicine

## 2014-12-11 ENCOUNTER — Ambulatory Visit: Payer: BC Managed Care – PPO | Admitting: Family Medicine

## 2015-01-01 ENCOUNTER — Ambulatory Visit (INDEPENDENT_AMBULATORY_CARE_PROVIDER_SITE_OTHER): Payer: Medicare Other | Admitting: Family Medicine

## 2015-01-01 ENCOUNTER — Encounter: Payer: Self-pay | Admitting: Family Medicine

## 2015-01-01 VITALS — BP 122/70 | HR 97 | Temp 97.3°F | Wt 143.2 lb

## 2015-01-01 DIAGNOSIS — L03115 Cellulitis of right lower limb: Secondary | ICD-10-CM

## 2015-01-01 MED ORDER — MIRTAZAPINE 15 MG PO TABS: 15.0000 mg | ORAL_TABLET | Freq: Every day | ORAL | Status: DC

## 2015-01-01 MED ORDER — DOXYCYCLINE HYCLATE 100 MG PO TABS
100.0000 mg | ORAL_TABLET | Freq: Two times a day (BID) | ORAL | Status: DC
Start: 2015-01-01 — End: 2015-03-24

## 2015-01-01 NOTE — Assessment & Plan Note (Signed)
New- treat with course of doxycyline. She is not sure but thinks she may be allergic to sulfa- added to her allergy list. Call or return to clinic prn if these symptoms worsen or fail to improve as anticipated. The patient indicates understanding of these issues and agrees with the plan.

## 2015-01-01 NOTE — Progress Notes (Signed)
Subjective:   Patient ID: Sherri [name], female    DOB: 12-15-1940, 74 y.o.   MRN: [id number]  Sherri [name] is a pleasant 74 y.o. year old female who presents to clinic today with Skin Discoloration  on 01/01/2015  HPI:  Right leg ?rash- Scratched her leg when it itched in bed a few weeks ago.  Seemed like it was healing ok.  Past few days, hyperpigmented around ankle and warm to touch.   No drainage.  She denies fevers, chills, nausea or vomiting.  She is not sure but think she might be allergic to sulfa.  Current Outpatient Prescriptions on File Prior to Visit  Medication Sig Dispense Refill  . amLODipine (NORVASC) 10 MG tablet TAKE 1 TABLET DAILY 90 tablet 0  . aspirin (BAYER ASPIRIN) 325 MG tablet Take 325 mg by mouth daily.      . calcium citrate-vitamin D 200-200 MG-UNIT TABS Take 1 tablet by mouth daily.      . cyclobenzaprine (FLEXERIL) 10 MG tablet Take 1 tablet (10 mg total) by mouth 3 (three) times daily as needed for muscle spasms. 30 tablet 0  . FIBER PO Take one half tablet by mouth dailyl     . Omega-3 Fatty Acids (FISH OIL) 1000 MG CAPS Take 1 capsule by mouth daily.      . rosuvastatin (CRESTOR) 5 MG tablet TAKE 1 TABLET DAILY 90 tablet 3  . valsartan-hydrochlorothiazide (DIOVAN-HCT) 160-12.5 MG per tablet TAKE 1 TABLET DAILY 90 tablet 1   No current facility-administered medications on file prior to visit.    Allergies  Allergen Reactions  . Hydrogen Peroxide Swelling  . Sulfa Antibiotics     Past Medical History  Diagnosis Date  . Hyperlipidemia   . Hypertension     No past surgical history on file.  No family history on file.  History   Social History  . Marital Status: Married    Spouse Name: N/A  . Number of Children: N/A  . Years of Education: N/A   Occupational History  . Not on file.   Social History Main Topics  . Smoking status: Never Smoker   . Smokeless tobacco: Never Used  . Alcohol Use: No  . Drug Use: No  . Sexual  Activity: Not on file   Other Topics Concern  . Not on file   Social History Narrative   The PMH, PSH, Social History, Family History, Medications, and allergies have been reviewed in [facility], and have been updated if relevant.  Review of Systems  Constitutional: Negative.   HENT: Negative.   Eyes: Negative.   Respiratory: Negative.   Cardiovascular: Negative.   Gastrointestinal: Negative.   Endocrine: Negative.   Genitourinary: Negative.   Skin: Positive for color change and rash.  Allergic/Immunologic: Negative.   Neurological: Negative.   Hematological: Negative.   Psychiatric/Behavioral: Negative.   All other systems reviewed and are negative.      Objective:    BP 122/70 mmHg  Pulse 97  Temp(Src) 97.3 F (36.3 C) (Oral)  Wt 143 lb 4 oz (64.978 kg)  SpO2 95% Wt Readings from Last 3 Encounters:  01/01/15 143 lb 4 oz (64.978 kg)  11/05/14 145 lb 12 oz (66.112 kg)  09/16/14 147 lb 12.8 oz (67.042 kg)     Physical Exam  Constitutional: She is oriented to person, place, and time. She appears well-developed and well-nourished. No distress.  HENT:  Head: Normocephalic.  Eyes: Conjunctivae are normal.  Neck: Normal range  of motion.  Cardiovascular: Normal rate.   Pulmonary/Chest: Effort normal.  Musculoskeletal: Normal range of motion.  Neurological: She is alert and oriented to person, place, and time. No cranial nerve deficit.  Skin:     Psychiatric: She has a normal mood and affect. Her behavior is normal. Judgment and thought content normal.  Nursing note and vitals reviewed.         Assessment & Plan:   Cellulitis of leg, right No Follow-up on file.

## 2015-01-01 NOTE — Patient Instructions (Signed)

## 2015-01-01 NOTE — Progress Notes (Signed)
Pre visit review using our clinic review tool, if applicable. No additional management support is needed unless otherwise documented below in the visit note. 

## 2015-02-01 ENCOUNTER — Other Ambulatory Visit: Payer: Self-pay | Admitting: Family Medicine

## 2015-02-22 ENCOUNTER — Emergency Department (HOSPITAL_COMMUNITY)
Admission: EM | Admit: 2015-02-22 | Discharge: 2015-02-22 | Disposition: A | Discharge status: Home / Self Care | Payer: Medicare Other | Attending: Emergency Medicine | Admitting: Emergency Medicine

## 2015-02-22 ENCOUNTER — Encounter (HOSPITAL_COMMUNITY): Payer: Self-pay | Admitting: *Deleted

## 2015-02-22 DIAGNOSIS — M7989 Other specified soft tissue disorders: Secondary | ICD-10-CM | POA: Diagnosis not present

## 2015-02-22 DIAGNOSIS — E785 Hyperlipidemia, unspecified: Secondary | ICD-10-CM | POA: Insufficient documentation

## 2015-02-22 DIAGNOSIS — I10 Essential (primary) hypertension: Secondary | ICD-10-CM | POA: Diagnosis not present

## 2015-02-22 DIAGNOSIS — Z7982 Long term (current) use of aspirin: Secondary | ICD-10-CM | POA: Insufficient documentation

## 2015-02-22 DIAGNOSIS — Z79899 Other long term (current) drug therapy: Secondary | ICD-10-CM | POA: Insufficient documentation

## 2015-02-22 DIAGNOSIS — R2241 Localized swelling, mass and lump, right lower limb: Secondary | ICD-10-CM | POA: Diagnosis not present

## 2015-02-22 DIAGNOSIS — R2242 Localized swelling, mass and lump, left lower limb: Secondary | ICD-10-CM | POA: Diagnosis not present

## 2015-02-22 LAB — BASIC METABOLIC PANEL
Anion gap: 12 (ref 5–15)
BUN: 21 mg/dL — ABNORMAL HIGH (ref 6–20)
CHLORIDE: 109 mmol/L (ref 101–111)
CO2: 21 mmol/L — AB (ref 22–32)
Calcium: 9.2 mg/dL (ref 8.9–10.3)
Creatinine, Ser: 1.38 mg/dL — ABNORMAL HIGH (ref 0.44–1.00)
GFR calc non Af Amer: 36 mL/min — ABNORMAL LOW (ref >60)
GFR, EST AFRICAN AMERICAN: 42 mL/min — AB (ref >60)
Glucose, Bld: 84 mg/dL (ref 65–99)
Potassium: 3.9 mmol/L (ref 3.5–5.1)
Sodium: 142 mmol/L (ref 135–145)

## 2015-02-22 NOTE — Discharge Instructions (Signed)
Please follow up with your primary care provider on Monday. Please return to the Emergency Department if symptoms worsen or new onset of shortness of breath, difficulty breathing, chest pain, chest tightness, leg pain.

## 2015-02-22 NOTE — ED Provider Notes (Signed)
CSN: [id number]     Arrival date & time 02/22/15  1047 History   First MD Initiated Contact with Patient 02/22/15 1107     Chief Complaint  Patient presents with  . Leg Swelling     (Consider location/radiation/quality/duration/timing/severity/associated sxs/prior Treatment) HPI Comments: Pt is a 74 yo female with PMH of HTN who presents to the ED with complaint of bilateral lower extremity swelling, onset yesterday. Pt reports noticing swelling around her ankles yesterday. Denies pain or any recent injury/trauma. She notes she has been taking her medications as prescribed which include amlodipine and diovan. Denies fever, headache, cough, SOB, CP, abdominal pain, N/V/D, urinary sxs, numbness, tingling, weakness.    Past Medical History  Diagnosis Date  . Hyperlipidemia   . Hypertension    History reviewed. No pertinent past surgical history. History reviewed. No pertinent family history. Social History  Substance Use Topics  . Smoking status: Never Smoker   . Smokeless tobacco: Never Used  . Alcohol Use: No   OB History    No data available     Review of Systems  Cardiovascular: Positive for leg swelling.  All other systems reviewed and are negative.     Allergies  Hydrogen peroxide and Sulfa antibiotics  Home Medications   Prior to Admission medications   Medication Sig Start Date End Date Taking? Authorizing Provider  amLODipine (NORVASC) 10 MG tablet TAKE 1 TABLET DAILY 02/03/15   Dianne Dun, MD  aspirin (BAYER ASPIRIN) 325 MG tablet Take 325 mg by mouth daily.      Historical Provider, MD  calcium citrate-vitamin D 200-200 MG-UNIT TABS Take 1 tablet by mouth daily.      Historical Provider, MD  cyclobenzaprine (FLEXERIL) 10 MG tablet Take 1 tablet (10 mg total) by mouth 3 (three) times daily as needed for muscle spasms. 04/27/13   Lorre Munroe, NP  doxycycline (VIBRA-TABS) 100 MG tablet Take 1 tablet (100 mg total) by mouth 2 (two) times daily. 01/01/15   Dianne Dun, MD  FIBER PO Take one half tablet by mouth dailyl     Historical Provider, MD  mirtazapine (REMERON) 15 MG tablet Take 1 tablet (15 mg total) by mouth at bedtime. 01/01/15   Dianne Dun, MD  Omega-3 Fatty Acids (FISH OIL) 1000 MG CAPS Take 1 capsule by mouth daily.      Historical Provider, MD  rosuvastatin (CRESTOR) 5 MG tablet TAKE 1 TABLET DAILY 03/19/14   Dianne Dun, MD  valsartan-hydrochlorothiazide (DIOVAN-HCT) 160-12.5 MG per tablet TAKE 1 TABLET DAILY 07/22/14   Dianne Dun, MD   BP 117/79 mmHg  Pulse 85  Temp(Src) 97.8 F (36.6 C) (Oral)  Resp 16  SpO2 98% Physical Exam  Constitutional: She is oriented to person, place, and time. She appears well-developed and well-nourished. No distress.  HENT:  Head: Normocephalic and atraumatic.  Eyes: Conjunctivae and EOM are normal. Pupils are equal, round, and reactive to light. Right eye exhibits no discharge. Left eye exhibits no discharge. No scleral icterus.  Neck: Normal range of motion. Neck supple.  Cardiovascular: Normal rate, regular rhythm, normal heart sounds and intact distal pulses.   Pulmonary/Chest: Effort normal and breath sounds normal. She has no wheezes. She has no rales. She exhibits no tenderness.  Abdominal: Soft. Bowel sounds are normal. She exhibits no mass. There is no tenderness. There is no rebound and no guarding.  Musculoskeletal: Normal range of motion. She exhibits edema (1+ pitting edema on BLE extending  up to mid-shin). She exhibits no tenderness.  Lymphadenopathy:    She has no cervical adenopathy.  Neurological: She is alert and oriented to person, place, and time. She has normal strength. No sensory deficit.  Skin: Skin is warm and dry.  Hyperpigmentation noted on right lower extremity  Nursing note and vitals reviewed.   ED Course  Procedures (including critical care time) Labs Review Labs Reviewed - No data to display  Imaging Review No results found. I have personally reviewed and  evaluated these images and lab results as part of my medical decision-making.  Filed Vitals:   02/22/15 1110  BP: 117/79  Pulse: 85  Temp: 97.8 F (36.6 C)  Resp: 16     MDM   Final diagnoses:  Swelling of lower extremity    Pt presents with bilateral leg swelling. Denies SOB, dyspnea, CP, chest tightness, leg pain. History of HTN. VSS. BMP unremarkable, kidney function unchanged. I do no suspect DVT, cardiac or kidney etiology requiring further work up at this time. No DVT sxs. Discussed with pt plan for d/c and to start her on lasix for swelling. Pt reports that she would rather wait until she follows up with PCP on Monday before starting a new medication. I advised pt that she needs to see her PCP on Monday or Tuesday and discussed return precautions that include SOB, dyspnea, CP, chest tightness, leg pain, increased swelling. Pt acknowledges my recommendations and agrees with plan.   Evaluation does not show pathology requring ongoing emergent intervention or admission. Pt is hemodynamically stable and mentating appropriately. Discussed findings/results and plan with patient/guardian, who agrees with plan. All questions answered. Return precautions discussed and outpatient follow up given.      Satira Sark [place], [place] 02/22/15 1306  Marily Memos, MD 02/22/15 [phone]

## 2015-02-22 NOTE — ED Provider Notes (Signed)
Medical screening examination/treatment/procedure(s) were conducted as a shared visit with non-physician practitioner(s) and myself.  I personally evaluated the patient during the encounter.  74 year old female bilateral lower extremity swelling started 2 days ago. No history of the same. No respiratory or cardiac symptoms. On exam she has 1+ pitting edema to the mid shin. No rales or wheezing. No hypoxia, tachycardia. Rest of her exam is benign as well. Discussed with the patient her options. States that she wanted to make sure there is no emergency going on. Doubt this is a new CHF without any other symptoms and no history of heart disease or hypertension. She'll follow-up with her doctor on Monday. We were going to start her on Lasix however she refused until she were able to see her doctor.  Sherri Memos, MD 02/22/15 [phone]

## 2015-02-22 NOTE — ED Notes (Addendum)
Bilateral LE edema that started yesterday. Denies pain.   Has been taking Diovan. Not taking Norvasc - ran out.

## 2015-02-24 ENCOUNTER — Ambulatory Visit: Payer: Medicare Other | Admitting: Internal Medicine

## 2015-02-25 ENCOUNTER — Ambulatory Visit (INDEPENDENT_AMBULATORY_CARE_PROVIDER_SITE_OTHER): Payer: Medicare Other | Admitting: Internal Medicine

## 2015-02-25 ENCOUNTER — Encounter: Payer: Self-pay | Admitting: Internal Medicine

## 2015-02-25 VITALS — BP 132/72 | HR 99 | Temp 98.0°F | Wt 146.5 lb

## 2015-02-25 DIAGNOSIS — R609 Edema, unspecified: Secondary | ICD-10-CM

## 2015-02-25 DIAGNOSIS — I10 Essential (primary) hypertension: Secondary | ICD-10-CM | POA: Diagnosis not present

## 2015-02-25 NOTE — Patient Instructions (Signed)
Peripheral Edema °You have swelling in your legs (peripheral edema). This swelling is due to excess accumulation of salt and water in your body. Edema may be a sign of heart, kidney or liver disease, or a side effect of a medication. It may also be due to problems in the leg veins. Elevating your legs and using special support stockings may be very helpful, if the cause of the swelling is due to poor venous circulation. Avoid long periods of standing, whatever the cause. °Treatment of edema depends on identifying the cause. Chips, pretzels, pickles and other salty foods should be avoided. Restricting salt in your diet is almost always needed. Water pills (diuretics) are often used to remove the excess salt and water from your body via urine. These medicines prevent the kidney from reabsorbing sodium. This increases urine flow. °Diuretic treatment may also result in lowering of potassium levels in your body. Potassium supplements may be needed if you have to use diuretics daily. Daily weights can help you keep track of your progress in clearing your edema. You should call your caregiver for follow up care as recommended. °SEEK IMMEDIATE MEDICAL CARE IF:  °· You have increased swelling, pain, redness, or heat in your legs. °· You develop shortness of breath, especially when lying down. °· You develop chest or abdominal pain, weakness, or fainting. °· You have a fever. °Document Released: 07/01/2004 Document Revised: 08/16/2011 Document Reviewed: 06/11/2009 °ExitCare® Patient Information ©2015 [employer]. This information is not intended to replace advice given to you by your health care provider. Make sure you discuss any questions you have with your health care provider. ° °

## 2015-02-25 NOTE — Progress Notes (Signed)
Subjective:    Patient ID: Sherri [name], female    DOB: 11/29/1940, 74 y.o.   MRN: [id number]  HPI  Pt presents to the clinic today for ER follow up for lower extremity edema (9/17). She reports her ankles were very swollen. They were not painful or red. ER labs showed a creatinine of 1.38 (stable for her). There was low suspicion for DVT. The started her on Lasix and advised her to follow up with her PCP. She reports she did not start taking the medication because she wanted to check with her PCP first. The swelling has improved but she denies pain in her legs, numbness and tingling, or redness. She denies chest pain or shortness of breath. The swelling is worse in the evening, better in the morning.She does have a history of HTN. She is taking Valsartan-HCT and Norvasc as prescribed.  I saw a note in the ER records that said she was not taking the Norvasc because she ran out of it but she tells me that she is taking it. ECG from 2010 essentially normal. She has never had a CHF workup.  Review of Systems      Past Medical History  Diagnosis Date  . Hyperlipidemia   . Hypertension     Current Outpatient Prescriptions  Medication Sig Dispense Refill  . amLODipine (NORVASC) 10 MG tablet TAKE 1 TABLET DAILY 90 tablet 1  . calcium citrate-vitamin D 200-200 MG-UNIT TABS Take 1 tablet by mouth daily.      . cyclobenzaprine (FLEXERIL) 10 MG tablet Take 1 tablet (10 mg total) by mouth 3 (three) times daily as needed for muscle spasms. 30 tablet 0  . doxycycline (VIBRA-TABS) 100 MG tablet Take 1 tablet (100 mg total) by mouth 2 (two) times daily. 14 tablet 0  . FIBER PO Take one half tablet by mouth dailyl     . mirtazapine (REMERON) 15 MG tablet Take 1 tablet (15 mg total) by mouth at bedtime. 30 tablet 3  . Omega-3 Fatty Acids (FISH OIL) 1000 MG CAPS Take 1 capsule by mouth daily.      . rosuvastatin (CRESTOR) 5 MG tablet TAKE 1 TABLET DAILY 90 tablet 3  . valsartan-hydrochlorothiazide  (DIOVAN-HCT) 160-12.5 MG per tablet TAKE 1 TABLET DAILY 90 tablet 1   No current facility-administered medications for this visit.    Allergies  Allergen Reactions  . Hydrogen Peroxide Swelling  . Sulfa Antibiotics     History reviewed. No pertinent family history.  Social History   Social History  . Marital Status: Married    Spouse Name: N/A  . Number of Children: N/A  . Years of Education: N/A   Occupational History  . Not on file.   Social History Main Topics  . Smoking status: Never Smoker   . Smokeless tobacco: Never Used  . Alcohol Use: No  . Drug Use: No  . Sexual Activity: Not on file   Other Topics Concern  . Not on file   Social History Narrative     Constitutional: Denies fever, malaise, fatigue, headache or abrupt weight changes.  Respiratory: Denies difficulty breathing, shortness of breath, cough or sputum production.   Cardiovascular: Pt reports swelling in legs. Denies chest pain, chest tightness, palpitations or swelling in the hands.   No other specific complaints in a complete review of systems (except as listed in HPI above).  Objective:   Physical Exam  BP 132/72 mmHg  Pulse 99  Temp(Src) 98 F (36.7 C) (  Oral)  Wt 146 lb 8 oz (66.452 kg)  SpO2 97% Wt Readings from Last 3 Encounters:  02/25/15 146 lb 8 oz (66.452 kg)  01/01/15 143 lb 4 oz (64.978 kg)  11/05/14 145 lb 12 oz (66.112 kg)    General: Appears her stated age, in NAD. Skin: Warm, dry and intact. She has noted vascular changes in her right lower extremity. Cardiovascular: Normal rate and rhythm. S1,S2 noted.  No murmur, rubs or gallops noted. 1-2 + pitting edema of bilateral ankles extending up to mid shin. Pulmonary/Chest: Normal effort and positive vesicular breath sounds. No respiratory distress. No wheezes, rales or ronchi noted.    BMET    Component Value Date/Time   NA 142 02/22/2015 1148   K 3.9 02/22/2015 1148   CL 109 02/22/2015 1148   CO2 21* 02/22/2015  1148   GLUCOSE 84 02/22/2015 1148   BUN 21* 02/22/2015 1148   CREATININE 1.38* 02/22/2015 1148   CALCIUM 9.2 02/22/2015 1148   GFRNONAA 36* 02/22/2015 1148   GFRAA 42* 02/22/2015 1148    Lipid Panel     Component Value Date/Time   CHOL 134 03/14/2014 0903   TRIG 129.0 03/14/2014 0903   HDL 49.30 03/14/2014 0903   CHOLHDL 3 03/14/2014 0903   VLDL 25.8 03/14/2014 0903   LDLCALC 59 03/14/2014 0903    CBC    Component Value Date/Time   WBC 6.3 08/20/2014 1206   RBC 4.26 08/20/2014 1206   HGB 12.4 08/20/2014 1206   HCT 37.9 08/20/2014 1206   PLT 212.0 08/20/2014 1206   MCV 89.0 08/20/2014 1206   MCH 29.4 10/13/2010 1433   MCHC 32.7 08/20/2014 1206   RDW 14.5 08/20/2014 1206   LYMPHSABS 1.9 08/20/2014 1206   MONOABS 0.4 08/20/2014 1206   EOSABS 0.2 08/20/2014 1206   BASOSABS 0.0 08/20/2014 1206    Hgb A1C Lab Results  Component Value Date   HGBA1C 5.8 03/14/2014         Assessment & Plan:   ER follow up for lower extremity edema:  ER notes and labs reviewed I think this is just venous insufficiency Advised her no to start Lasix RX given for [facility]- she will take this to a medical supply store and get filled Advised her to wear them during the day and take off at night Continue BP medications Return precautions given If problem persist or worsen- will consider heart failure workup  RTC as needed or if symptoms persist at this time

## 2015-02-25 NOTE — Progress Notes (Signed)
Pre visit review using our clinic review tool, if applicable. No additional management support is needed unless otherwise documented below in the visit note. 

## 2015-02-26 ENCOUNTER — Telehealth: Payer: Self-pay

## 2015-02-26 NOTE — Telephone Encounter (Signed)
Pt left v/m requesting place that has TED stockings; advised [facility] in [place] has TED stockings. Pt voiced understanding.

## 2015-03-05 ENCOUNTER — Other Ambulatory Visit: Payer: Self-pay | Admitting: Family Medicine

## 2015-03-05 NOTE — Telephone Encounter (Signed)
Please call and schedule CPE with fasting labs prior with Dr. Dayton Martes.

## 2015-03-14 NOTE — Telephone Encounter (Signed)
Pt scheduled with Dr. Dayton Martes 03/24/15 for CPE. Please close encounter. Thanks

## 2015-03-24 ENCOUNTER — Encounter: Payer: Self-pay | Admitting: Family Medicine

## 2015-03-24 ENCOUNTER — Ambulatory Visit (INDEPENDENT_AMBULATORY_CARE_PROVIDER_SITE_OTHER): Payer: Medicare Other | Admitting: Family Medicine

## 2015-03-24 ENCOUNTER — Other Ambulatory Visit: Payer: Self-pay | Admitting: Family Medicine

## 2015-03-24 VITALS — BP 128/64 | HR 96 | Temp 97.4°F | Ht 65.5 in | Wt 144.2 lb

## 2015-03-24 DIAGNOSIS — Z0000 Encounter for general adult medical examination without abnormal findings: Secondary | ICD-10-CM

## 2015-03-24 DIAGNOSIS — R634 Abnormal weight loss: Secondary | ICD-10-CM | POA: Diagnosis not present

## 2015-03-24 DIAGNOSIS — E785 Hyperlipidemia, unspecified: Secondary | ICD-10-CM

## 2015-03-24 DIAGNOSIS — R944 Abnormal results of kidney function studies: Secondary | ICD-10-CM

## 2015-03-24 DIAGNOSIS — Z638 Other specified problems related to primary support group: Secondary | ICD-10-CM | POA: Diagnosis not present

## 2015-03-24 DIAGNOSIS — Z23 Encounter for immunization: Secondary | ICD-10-CM

## 2015-03-24 DIAGNOSIS — I10 Essential (primary) hypertension: Secondary | ICD-10-CM | POA: Diagnosis not present

## 2015-03-24 DIAGNOSIS — F439 Reaction to severe stress, unspecified: Secondary | ICD-10-CM

## 2015-03-24 DIAGNOSIS — F4323 Adjustment disorder with mixed anxiety and depressed mood: Secondary | ICD-10-CM

## 2015-03-24 LAB — CBC WITH DIFFERENTIAL/PLATELET
BASOS PCT: 0.3 % (ref 0.0–3.0)
Basophils Absolute: 0 10*3/uL (ref 0.0–0.1)
EOS PCT: 1.6 % (ref 0.0–5.0)
Eosinophils Absolute: 0.1 10*3/uL (ref 0.0–0.7)
HEMATOCRIT: 38.2 % (ref 36.0–46.0)
Hemoglobin: 12.3 g/dL (ref 12.0–15.0)
LYMPHS ABS: 1.3 10*3/uL (ref 0.7–4.0)
LYMPHS PCT: 23 % (ref 12.0–46.0)
MCHC: 32.3 g/dL (ref 30.0–36.0)
MCV: 90 fl (ref 78.0–100.0)
MONOS PCT: 4.9 % (ref 3.0–12.0)
Monocytes Absolute: 0.3 10*3/uL (ref 0.1–1.0)
NEUTROS ABS: 3.9 10*3/uL (ref 1.4–7.7)
NEUTROS PCT: 70.2 % (ref 43.0–77.0)
PLATELETS: 175 10*3/uL (ref 150.0–400.0)
RBC: 4.25 Mil/uL (ref 3.87–5.11)
RDW: 13.9 % (ref 11.5–15.5)
WBC: 5.5 10*3/uL (ref 4.0–10.5)

## 2015-03-24 LAB — COMPREHENSIVE METABOLIC PANEL
ALT: 14 U/L (ref 0–35)
AST: 20 U/L (ref 0–37)
Albumin: 4.3 g/dL (ref 3.5–5.2)
Alkaline Phosphatase: 63 U/L (ref 39–117)
BUN: 21 mg/dL (ref 6–23)
CHLORIDE: 107 meq/L (ref 96–112)
CO2: 29 mEq/L (ref 19–32)
Calcium: 10.4 mg/dL (ref 8.4–10.5)
Creatinine, Ser: 1.46 mg/dL — ABNORMAL HIGH (ref 0.40–1.20)
GFR: 44.84 mL/min — AB (ref >60.00)
GLUCOSE: 96 mg/dL (ref 70–99)
POTASSIUM: 4.5 meq/L (ref 3.5–5.1)
Sodium: 144 mEq/L (ref 135–145)
Total Bilirubin: 0.5 mg/dL (ref 0.2–1.2)
Total Protein: 7.5 g/dL (ref 6.0–8.3)

## 2015-03-24 LAB — LIPID PANEL
CHOLESTEROL: 138 mg/dL (ref 0–200)
HDL: 62.6 mg/dL (ref >39.00)
LDL CALC: 60 mg/dL (ref 0–99)
NonHDL: 75.62
Total CHOL/HDL Ratio: 2
Triglycerides: 79 mg/dL (ref 0.0–149.0)
VLDL: 15.8 mg/dL (ref 0.0–40.0)

## 2015-03-24 LAB — TSH: TSH: 1.75 u[IU]/mL (ref 0.35–4.50)

## 2015-03-24 NOTE — Assessment & Plan Note (Signed)
Weight stable.  She is pleased with current dose of remeron.  Continue Remeron 15 mg qhs.

## 2015-03-24 NOTE — Assessment & Plan Note (Signed)
The patients weight, height, BMI and visual acuity have been recorded in the chart.  Cognitive function assessed.   I have made referrals, counseling and provided education to the patient based review of the above and I have provided the pt with a written personalized care plan for preventive services.  Influenza vaccine today.  Labs today.  Orders Placed This Encounter  Procedures  . CBC with Differential/Platelet  . Comprehensive metabolic panel  . Lipid panel  . TSH  . Hepatitis C Antibody

## 2015-03-24 NOTE — Assessment & Plan Note (Signed)
Continue current dose of Crestor. Check labs today.

## 2015-03-24 NOTE — Patient Instructions (Signed)
Good to see you. We will call you with your lab results from today. 

## 2015-03-24 NOTE — Assessment & Plan Note (Signed)
Well controlled on current rxs. No changes made today. 

## 2015-03-24 NOTE — Progress Notes (Signed)
Pre visit review using our clinic review tool, if applicable. No additional management support is needed unless otherwise documented below in the visit note. 

## 2015-03-24 NOTE — Progress Notes (Signed)
Subjective:   Patient ID: Sherri BirkCreola S [name], female    DOB: 11/28/1940, 74 y.o.   MRN: [id number]  Sherri [name] is a pleasant 74 y.o. year old female who presents to clinic today with her brother for Annual Exam and follow up of chronic medical conditions on 03/24/2015  HPI: I have personally reviewed the Medicare Annual Wellness questionnaire and have noted 1. The patient's medical and social history 2. Their use of alcohol, tobacco or illicit drugs 3. Their current medications and supplements 4. The patient's functional ability including ADL's, fall risks, home safety risks and hearing or visual             impairment. 5. Diet and physical activities 6. Evidence for depression or mood disorders  End of life wishes discussed and updated in Social History.  The roster of all physicians providing medical care to patient - is listed in the [facility] section of the chart.  Prevnar 13 03/13/14 Mammogram 11/13/14 Colonoscopy- ? 04/2009- neg IFOB 08/26/14 Zostavax 03/2009   HTN- has been complaint with daily amlodipine and diovan HCT. Denies any HA, blurred vision, CP or SOB. Lab Results  Component Value Date   CREATININE 1.38* 02/22/2015   HLD- takes Crestor and fish oil.  Lab Results  Component Value Date   CHOL 134 03/14/2014   HDL 49.30 03/14/2014   LDLCALC 59 03/14/2014   TRIG 129.0 03/14/2014   CHOLHDL 3 03/14/2014   Lab Results  Component Value Date   ALT 15 08/20/2014   AST 18 08/20/2014   ALKPHOS 64 08/20/2014   BILITOT 0.4 08/20/2014   Denies myalgias.  Persistent weight loss and stressors at home-  I saw her this past spring for this complaint without any other symptoms- no abdominal pain, early satiety or blood in her stool. Has denied any symptoms of anxiety or depression.   Per our system, last colonoscopy was in 2010 but I could not find actual report. Last month, she agreed to IFOB which was negative.  CBC, TSH, electrolytes  unremarkable. Mammogram UTD- 10/30/13, has one scheduled for 11/11/2014.  She has never smoked- no fevers, chills, night sweats, cough or chest pain.  Started her on Remeron 7.5 mg qhs which we then increased to 15 mg qhs.  Weight is stable, has not continued to lose weight.  She feels her appetite is good.  Wt Readings from Last 3 Encounters:  03/24/15 144 lb 4 oz (65.431 kg)  02/25/15 146 lb 8 oz (66.452 kg)  01/01/15 143 lb 4 oz (64.978 kg)   Current Outpatient Prescriptions on File Prior to Visit  Medication Sig Dispense Refill  . amLODipine (NORVASC) 10 MG tablet TAKE 1 TABLET DAILY 90 tablet 1  . calcium citrate-vitamin D 200-200 MG-UNIT TABS Take 1 tablet by mouth daily.      . mirtazapine (REMERON) 15 MG tablet Take 1 tablet (15 mg total) by mouth at bedtime. 30 tablet 3  . Omega-3 Fatty Acids (FISH OIL) 1000 MG CAPS Take 1 capsule by mouth daily.      . rosuvastatin (CRESTOR) 5 MG tablet TAKE 1 TABLET DAILY 90 tablet 0  . valsartan-hydrochlorothiazide (DIOVAN-HCT) 160-12.5 MG per tablet TAKE 1 TABLET DAILY 90 tablet 1   No current facility-administered medications on file prior to visit.    Allergies  Allergen Reactions  . Hydrogen Peroxide Swelling  . Sulfa Antibiotics     Past Medical History  Diagnosis Date  . Hyperlipidemia   . Hypertension  No past surgical history on file.  No family history on file.  Social History   Social History  . Marital Status: Married    Spouse Name: N/A  . Number of Children: N/A  . Years of Education: N/A   Occupational History  . Not on file.   Social History Main Topics  . Smoking status: Never Smoker   . Smokeless tobacco: Never Used  . Alcohol Use: No  . Drug Use: No  . Sexual Activity: Not on file   Other Topics Concern  . Not on file   Social History Narrative   Desires CPR.   Would not want prolonged life support if futile   The PMH, PSH, Social History, Family History, Medications, and allergies have  been reviewed in [facility], and have been updated if relevant.     Review of Systems  Constitutional: Negative.   HENT: Negative.   Eyes: Negative.   Respiratory: Negative.   Cardiovascular: Negative.   Gastrointestinal: Negative.   Endocrine: Negative.   Genitourinary: Negative.   Musculoskeletal: Negative.   Skin: Negative.   Allergic/Immunologic: Negative.   Neurological: Negative.   Hematological: Negative.   Psychiatric/Behavioral: Negative.   All other systems reviewed and are negative.      Objective:    BP 128/64 mmHg  Pulse 96  Temp(Src) 97.4 F (36.3 C) (Oral)  Ht 5' 5.5" (1.664 m)  Wt 144 lb 4 oz (65.431 kg)  BMI 23.63 kg/m2  SpO2 97%   Physical Exam  Constitutional: She is oriented to person, place, and time. She appears well-developed and well-nourished. No distress.  HENT:  Head: Normocephalic and atraumatic.  Eyes: Conjunctivae are normal.  Neck: Normal range of motion.  Cardiovascular: Normal rate and regular rhythm.   Pulmonary/Chest: Effort normal and breath sounds normal.  Abdominal: Soft. Bowel sounds are normal. She exhibits no distension and no mass. There is no tenderness. There is no rebound and no guarding.  Musculoskeletal: Normal range of motion. She exhibits no edema.  Neurological: She is alert and oriented to person, place, and time. No cranial nerve deficit.  Skin: Skin is warm and dry.  Psychiatric: She has a normal mood and affect. Her behavior is normal. Thought content normal.  Nursing note and vitals reviewed.         Assessment & Plan:   Medicare annual wellness visit, subsequent - Plan: CBC with Differential/Platelet, Comprehensive metabolic panel, TSH, Hepatitis C Antibody  Hyperlipidemia - Plan: Comprehensive metabolic panel, Lipid panel  Essential hypertension  Adjustment disorder with mixed anxiety and depressed mood  Stress at home  Loss of weight  Need for influenza vaccination - Plan: Flu Vaccine QUAD 36+ mos  PF IM (Fluarix & Fluzone Quad PF) No Follow-up on file.

## 2015-03-25 LAB — HEPATITIS C ANTIBODY: HCV AB: NEGATIVE

## 2015-03-26 ENCOUNTER — Telehealth: Payer: Self-pay | Admitting: Family Medicine

## 2015-03-26 NOTE — Telephone Encounter (Signed)
Spoke to pts daughter and discussed pts most recent labs and OV

## 2015-03-26 NOTE — Telephone Encounter (Signed)
Pts daughter called wanting to speak to Dr. [name] about some medical issues dealing with her mother.   Call Back # [phone]

## 2015-03-26 NOTE — Telephone Encounter (Signed)
Lm on  Daughters vm requesting a call back

## 2015-03-31 ENCOUNTER — Other Ambulatory Visit: Payer: Self-pay | Admitting: *Deleted

## 2015-03-31 MED ORDER — MIRTAZAPINE 15 MG PO TABS: 15.0000 mg | ORAL_TABLET | Freq: Every day | ORAL | Status: DC

## 2015-03-31 NOTE — Telephone Encounter (Signed)
He is wanting to discuss side effects of remeron

## 2015-03-31 NOTE — Telephone Encounter (Signed)
Please call pt's brother if he is on DPR and find out what his questions are.  Please apologize to him that I am out sick.

## 2015-03-31 NOTE — Telephone Encounter (Signed)
Pt 's brother calling also and would like to discuss side effects of the medication with Dr. [name], per brother he has spoken to Dr. [name] before.

## 2015-04-02 NOTE — Telephone Encounter (Addendum)
Returned pt's brother's call.   Answers his questions.

## 2015-04-07 DIAGNOSIS — I10 Essential (primary) hypertension: Secondary | ICD-10-CM | POA: Diagnosis not present

## 2015-04-07 DIAGNOSIS — N183 Chronic kidney disease, stage 3 (moderate): Secondary | ICD-10-CM | POA: Diagnosis not present

## 2015-04-07 DIAGNOSIS — E875 Hyperkalemia: Secondary | ICD-10-CM | POA: Diagnosis not present

## 2015-04-07 DIAGNOSIS — E785 Hyperlipidemia, unspecified: Secondary | ICD-10-CM | POA: Diagnosis not present

## 2015-04-07 DIAGNOSIS — R319 Hematuria, unspecified: Secondary | ICD-10-CM | POA: Diagnosis not present

## 2015-04-09 DIAGNOSIS — N183 Chronic kidney disease, stage 3 (moderate): Secondary | ICD-10-CM | POA: Diagnosis not present

## 2015-04-18 ENCOUNTER — Telehealth: Payer: Self-pay

## 2015-04-18 MED ORDER — ROSUVASTATIN CALCIUM 5 MG PO TABS: 5.0000 mg | ORAL_TABLET | Freq: Every day | ORAL | Status: DC

## 2015-04-18 MED ORDER — VALSARTAN-HYDROCHLOROTHIAZIDE 160-12.5 MG PO TABS: 1.0000 | ORAL_TABLET | Freq: Every day | ORAL | Status: DC

## 2015-04-18 NOTE — Telephone Encounter (Signed)
Sherri KimJames Smith Jr. DPR signed request refill rosuvastatin [facility] Last annual exam on 03/24/15. Refill done per protocol and Mr Sherri [name] notified done.

## 2015-04-25 DIAGNOSIS — N183 Chronic kidney disease, stage 3 (moderate): Secondary | ICD-10-CM | POA: Diagnosis not present

## 2015-04-25 DIAGNOSIS — I10 Essential (primary) hypertension: Secondary | ICD-10-CM | POA: Diagnosis not present

## 2015-04-25 DIAGNOSIS — R319 Hematuria, unspecified: Secondary | ICD-10-CM | POA: Diagnosis not present

## 2015-06-05 ENCOUNTER — Encounter: Payer: Self-pay | Admitting: *Deleted

## 2015-07-01 ENCOUNTER — Telehealth: Payer: Self-pay

## 2015-07-01 NOTE — Telephone Encounter (Signed)
PLEASE NOTE: All timestamps contained within this report are represented as [place] Standard Time. CONFIDENTIALTY NOTICE: This fax transmission is intended only for the addressee. It contains information that is legally privileged, confidential or otherwise protected from use or disclosure. If you are not the intended recipient, you are strictly prohibited from reviewing, disclosing, copying using or disseminating any of this information or taking any action in reliance on or regarding this information. If you have received this fax in error, please notify us immediately by telephone so that we can arrange for its return to [place]. Phone: [phone], Toll-Free: [phone], Fax: [phone] Page: 1 of 2 Call Id: [id number] [facility] Night - Client TELEPHONE ADVICE RECORD [facility] Call [facility] Patient Name: Sherri [name] Gender: Female DOB: [birth year] Age: 75 Y 10 M 15 D Return Phone Number: [phone] (Primary) Address: City/State/Zip: [place] Client [facility] Night - Client Client Site [facility] - Night Physician Ruthe Mannan Contact Type Call Call Type Triage / Clinical Caller Name Jodean Relationship To Patient Self Return Phone Number [phone] (Primary) Chief Complaint Medication Question (non symptomatic) Initial Comment Caller states she was prescribed a BP medication - The pharmacist informed her she already has another BP medication and now she has concerns about if she should be taking 2. Nurse Assessment Nurse: Katrinka Blazing, RN, Cala Bradford Date/Time Lamount Cohen Time): 07/01/2015 4:03:32 PM Confirm and document reason for call. If symptomatic, describe symptoms. You must click the next button to save text entered. ---Caller states she was prescribed a BP medication - The pharmacist informed her she already has another BP medication and now she has concerns about if she should be taking 2. NO TRIAGE -  caller states no symptoms. Has the patient traveled out of the country within the last 30 days? ---No Does the patient have any new or worsening symptoms? ---No Please document clinical information provided and list any resource used. ---Uses [facility] [phone]) and has Rx there for Valsartan 160 mg - take 1 tablet QD. (Confirmed by pharmacy).Caller states she may have gotten her other BP medication through home delivery and she thinks it may be HCTZ or a combination with, but unable to give full information. Called [facility] via back number and spoke with triage nurse who will call patient to clarify what blood pressure medications she should be taking. Guidelines Guideline Title Affirmed Question Affirmed Notes Nurse Date/Time (Eastern Time) Disp. Time Lamount Cohen Time) Disposition Final User 07/01/2015 4:33:37 PM Pharmacy Call Katrinka Blazing, RN, Cala Bradford Reason: Called [facility] [phone]) to confirm what blood pressure medication patient had on file there - Rx is Valsartan 160 mg - take 1 tablet daily. No other blood pressure medications on file there. 07/01/2015 4:34:11 PM Clinical Call Yes Katrinka Blazing, RN, Cala Bradford PLEASE NOTE: All timestamps contained within this report are represented as [place] Standard Time. CONFIDENTIALTY NOTICE: This fax transmission is intended only for the addressee. It contains information that is legally privileged, confidential or otherwise protected from use or disclosure. If you are not the intended recipient, you are strictly prohibited from reviewing, disclosing, copying using or disseminating any of this information or taking any action in reliance on or regarding this information. If you have received this fax in error, please notify us immediately by telephone so that we can arrange for its return to [place]. Phone: [phone], Toll-Free: [phone], Fax: [phone] Page: 2 of 2 Call Id: [id number] After Care Instructions  Given Call Event Type User  Date / Time Description

## 2015-07-01 NOTE — Telephone Encounter (Signed)
Sherri [name] with [employer] called and requested Sherri [name] to call pt about her medications; Sherri [name] said pt seemed confused about her meds and there was nothing for Sherri [name] to triage. Spoke with pt and valsartan 160 mg was picked up today at [facility]; [name] and [facility]ri [name] sent in valsartan 160 mg to [facility]. Pt said no one helped her with her meds. Pt did say I could speak with her son Sherri [name] at [phone]; spoke with [facility] and he confirmed pt takes her own meds; Sherri [name] did go to appt with pt at Sherri [name] office but sat out in waiting area and did not go back with pt so Sherri [name] is not aware what Sherri [name] instructions were or if Sherri [name] is aware of meds pt is taking. Sherri [name] said that pt had told him she was not taking any med until sees Sherri [name] on 07/03/15.Sherri [name] will come with pt to the 07/03/15 appt. Sorry for the lateness of this call but spoke with pt and Sherri [name] for more than 30 mins. Sherri [name] said someone can call him 07/02/15 at [phone] and advise what med pt should be taking.

## 2015-07-01 NOTE — Telephone Encounter (Signed)
Please call pt to clarify what she is taking.

## 2015-07-02 NOTE — Telephone Encounter (Signed)
She should be taking two- amlodipine and diovan-hct according to our last OV.

## 2015-07-02 NOTE — Telephone Encounter (Signed)
Spoke to pt who is wanting to clarify if she should be taking one, or two separate BP meds

## 2015-07-02 NOTE — Telephone Encounter (Signed)
Spoke to pt and advised per Dr Aron. Pt verbally expressed understanding.  

## 2015-07-03 ENCOUNTER — Encounter: Payer: Self-pay | Admitting: Family Medicine

## 2015-07-03 ENCOUNTER — Ambulatory Visit (INDEPENDENT_AMBULATORY_CARE_PROVIDER_SITE_OTHER): Payer: Medicare Other | Admitting: Family Medicine

## 2015-07-03 VITALS — BP 122/74 | HR 95 | Temp 98.2°F | Wt 142.0 lb

## 2015-07-03 DIAGNOSIS — Z7189 Other specified counseling: Secondary | ICD-10-CM | POA: Insufficient documentation

## 2015-07-03 NOTE — Assessment & Plan Note (Addendum)
>  25 minutes spent in face to face time with patient, >50% spent in counselling or coordination of care. We reviewed each other her medications and I advised someone to help her set up her daily pills and to use only ONE pharmacy to avoid future confusion. The patient indicates understanding of these issues and agrees with the plan.

## 2015-07-03 NOTE — Progress Notes (Signed)
Subjective:   Patient ID: Sherri [name], female    DOB: 07/23/1940, 75 y.o.   MRN: [id number]  Sherri [name] is a pleasant 75 y.o. year old female who presents to clinic today with her brother for Follow-up and follow up of chronic medical conditions on 07/03/2015   Sherri [name] and her brother are both confused about what she should and should not be taking.  Receiving rxs from local and mail order pharmacies which has been causing some confusion.  HTN- has been complaint with daily amlodipine and diovan HCT. Denies any HA, blurred vision, CP or SOB. Lab Results  Component Value Date   CREATININE 1.46* 03/24/2015   HLD- takes Crestor and fish oil.  Lab Results  Component Value Date   CHOL 138 03/24/2015   HDL 62.60 03/24/2015   LDLCALC 60 03/24/2015   TRIG 79.0 03/24/2015   CHOLHDL 2 03/24/2015   Lab Results  Component Value Date   ALT 14 03/24/2015   AST 20 03/24/2015   ALKPHOS 63 03/24/2015   BILITOT 0.5 03/24/2015    Wt Readings from Last 3 Encounters:  07/03/15 142 lb (64.411 kg)  03/24/15 144 lb 4 oz (65.431 kg)  02/25/15 146 lb 8 oz (66.452 kg)   Current Outpatient Prescriptions on File Prior to Visit  Medication Sig Dispense Refill  . amLODipine (NORVASC) 10 MG tablet TAKE 1 TABLET DAILY 90 tablet 1  . calcium citrate-vitamin D 200-200 MG-UNIT TABS Take 1 tablet by mouth daily.      . Glucosamine-Chondroit-Vit C-Mn (GLUCOSAMINE 1500 COMPLEX) CAPS Take by mouth.    . mirtazapine (REMERON) 15 MG tablet Take 1 tablet (15 mg total) by mouth at bedtime. 30 tablet 3  . Omega-3 Fatty Acids (FISH OIL) 1000 MG CAPS Take 1 capsule by mouth daily.      . rosuvastatin (CRESTOR) 5 MG tablet Take 1 tablet (5 mg total) by mouth daily. 90 tablet 3  . valsartan-hydrochlorothiazide (DIOVAN-HCT) 160-12.5 MG tablet Take 1 tablet by mouth daily. 90 tablet 3   No current facility-administered medications on file prior to visit.    Allergies  Allergen Reactions  . Hydrogen  Peroxide Swelling  . Sulfa Antibiotics     Past Medical History  Diagnosis Date  . Hyperlipidemia   . Hypertension     No past surgical history on file.  No family history on file.  Social History   Social History  . Marital Status: Married    Spouse Name: N/A  . Number of Children: N/A  . Years of Education: N/A   Occupational History  . Not on file.   Social History Main Topics  . Smoking status: Never Smoker   . Smokeless tobacco: Never Used  . Alcohol Use: No  . Drug Use: No  . Sexual Activity: Not on file   Other Topics Concern  . Not on file   Social History Narrative   Desires CPR.   Would not want prolonged life support if futile   The PMH, PSH, Social History, Family History, Medications, and allergies have been reviewed in [facility], and have been updated if relevant.     Review of Systems  Constitutional: Negative.   HENT: Negative.   Eyes: Negative.   Respiratory: Negative.   Cardiovascular: Negative.   Gastrointestinal: Negative.   Endocrine: Negative.   Genitourinary: Negative.   Musculoskeletal: Negative.   Skin: Negative.   Allergic/Immunologic: Negative.   Neurological: Negative.   Hematological: Negative.   Psychiatric/Behavioral: Negative.  All other systems reviewed and are negative.      Objective:    BP 122/74 mmHg  Pulse 95  Temp(Src) 98.2 F (36.8 C) (Oral)  Wt 142 lb (64.411 kg)  SpO2 96%   Physical Exam  Constitutional: She is oriented to person, place, and time. She appears well-developed and well-nourished. No distress.  HENT:  Head: Normocephalic and atraumatic.  Eyes: Conjunctivae are normal.  Neck: Normal range of motion.  Cardiovascular: Normal rate and regular rhythm.   Pulmonary/Chest: Effort normal and breath sounds normal.  Abdominal: Soft. Bowel sounds are normal. She exhibits no distension and no mass. There is no tenderness. There is no rebound and no guarding.  Musculoskeletal: Normal range of motion.  She exhibits no edema.  Neurological: She is alert and oriented to person, place, and time. No cranial nerve deficit.  Skin: Skin is warm and dry.  Psychiatric: She has a normal mood and affect. Her behavior is normal. Thought content normal.  Nursing note and vitals reviewed.         Assessment & Plan:   Encounter for medication counseling No Follow-up on file.

## 2015-07-03 NOTE — Progress Notes (Signed)
Pre visit review using our clinic review tool, if applicable. No additional management support is needed unless otherwise documented below in the visit note. 

## 2015-07-04 MED ORDER — ROSUVASTATIN CALCIUM 5 MG PO TABS: 5.0000 mg | ORAL_TABLET | Freq: Every day | ORAL | Status: DC

## 2015-07-04 NOTE — Addendum Note (Signed)
Addended by: Desmond Dike on: 07/04/2015 04:12 PM   Modules accepted: Orders

## 2015-07-04 NOTE — Addendum Note (Signed)
Addended by: Desmond Dike on: 07/04/2015 03:24 PM   Modules accepted: Orders

## 2015-07-18 DIAGNOSIS — N2581 Secondary hyperparathyroidism of renal origin: Secondary | ICD-10-CM | POA: Diagnosis not present

## 2015-07-18 DIAGNOSIS — N183 Chronic kidney disease, stage 3 (moderate): Secondary | ICD-10-CM | POA: Diagnosis not present

## 2015-07-18 DIAGNOSIS — R319 Hematuria, unspecified: Secondary | ICD-10-CM | POA: Diagnosis not present

## 2015-07-18 DIAGNOSIS — I129 Hypertensive chronic kidney disease with stage 1 through stage 4 chronic kidney disease, or unspecified chronic kidney disease: Secondary | ICD-10-CM | POA: Diagnosis not present

## 2015-07-28 ENCOUNTER — Telehealth: Payer: Self-pay | Admitting: Family Medicine

## 2015-07-28 NOTE — Telephone Encounter (Signed)
Husband came in requesting help for his wife. He thinks she may have a behavioral issue going on or ealry stages of alzheimer's. Her behaviors are getting worse.  Husbands phone number is [phone] cell  Thank you

## 2015-07-28 NOTE — Telephone Encounter (Signed)
Please verify if he is on DPR or not.  I am not sure how to verify this for sure.

## 2015-07-28 NOTE — Telephone Encounter (Signed)
Pt's brother and daughter are listed on her designated party release.  Her husband is not listed.  You can listen to what he has to say, we just cannot relay any medical information to him about the patient.

## 2015-07-29 NOTE — Telephone Encounter (Signed)
Attempted to contact pts husband. Per pt, husband is not available.

## 2015-07-29 NOTE — Telephone Encounter (Signed)
Please call pt's husband to get more information.  Per Hansel Starling, ok to call him back even though he is NOT on DPR.  Please do not given him ANY information.

## 2015-07-30 NOTE — Telephone Encounter (Signed)
Lm on pts vm requesting a call back 

## 2015-08-03 ENCOUNTER — Inpatient Hospital Stay (HOSPITAL_COMMUNITY)
Admission: EM | Admit: 2015-08-03 | Discharge: 2015-08-07 | DRG: 041 | Disposition: A | Discharge status: Home / Self Care | Payer: Medicare Other | Attending: Family Medicine | Admitting: Family Medicine

## 2015-08-03 ENCOUNTER — Inpatient Hospital Stay (HOSPITAL_COMMUNITY): Payer: Medicare Other

## 2015-08-03 ENCOUNTER — Emergency Department (HOSPITAL_COMMUNITY): Payer: Medicare Other

## 2015-08-03 ENCOUNTER — Encounter (HOSPITAL_COMMUNITY): Payer: Self-pay | Admitting: Nurse Practitioner

## 2015-08-03 DIAGNOSIS — I6340 Cerebral infarction due to embolism of unspecified cerebral artery: Secondary | ICD-10-CM | POA: Diagnosis present

## 2015-08-03 DIAGNOSIS — I119 Hypertensive heart disease without heart failure: Secondary | ICD-10-CM | POA: Diagnosis not present

## 2015-08-03 DIAGNOSIS — Z888 Allergy status to other drugs, medicaments and biological substances status: Secondary | ICD-10-CM | POA: Diagnosis not present

## 2015-08-03 DIAGNOSIS — R471 Dysarthria and anarthria: Secondary | ICD-10-CM | POA: Diagnosis present

## 2015-08-03 DIAGNOSIS — E118 Type 2 diabetes mellitus with unspecified complications: Secondary | ICD-10-CM | POA: Diagnosis not present

## 2015-08-03 DIAGNOSIS — Z882 Allergy status to sulfonamides status: Secondary | ICD-10-CM

## 2015-08-03 DIAGNOSIS — R2689 Other abnormalities of gait and mobility: Secondary | ICD-10-CM | POA: Diagnosis present

## 2015-08-03 DIAGNOSIS — I639 Cerebral infarction, unspecified: Secondary | ICD-10-CM | POA: Diagnosis not present

## 2015-08-03 DIAGNOSIS — F0390 Unspecified dementia without behavioral disturbance: Secondary | ICD-10-CM | POA: Diagnosis present

## 2015-08-03 DIAGNOSIS — R29700 NIHSS score 0: Secondary | ICD-10-CM | POA: Diagnosis present

## 2015-08-03 DIAGNOSIS — Z79899 Other long term (current) drug therapy: Secondary | ICD-10-CM

## 2015-08-03 DIAGNOSIS — I6339 Cerebral infarction due to thrombosis of other cerebral artery: Secondary | ICD-10-CM | POA: Diagnosis not present

## 2015-08-03 DIAGNOSIS — N182 Chronic kidney disease, stage 2 (mild): Secondary | ICD-10-CM | POA: Diagnosis present

## 2015-08-03 DIAGNOSIS — Z9114 Patient's other noncompliance with medication regimen: Secondary | ICD-10-CM

## 2015-08-03 DIAGNOSIS — N189 Chronic kidney disease, unspecified: Secondary | ICD-10-CM | POA: Insufficient documentation

## 2015-08-03 DIAGNOSIS — R42 Dizziness and giddiness: Secondary | ICD-10-CM | POA: Diagnosis not present

## 2015-08-03 DIAGNOSIS — E785 Hyperlipidemia, unspecified: Secondary | ICD-10-CM | POA: Diagnosis present

## 2015-08-03 DIAGNOSIS — Z8673 Personal history of transient ischemic attack (TIA), and cerebral infarction without residual deficits: Secondary | ICD-10-CM | POA: Diagnosis not present

## 2015-08-03 DIAGNOSIS — N390 Urinary tract infection, site not specified: Secondary | ICD-10-CM | POA: Diagnosis present

## 2015-08-03 DIAGNOSIS — I129 Hypertensive chronic kidney disease with stage 1 through stage 4 chronic kidney disease, or unspecified chronic kidney disease: Secondary | ICD-10-CM | POA: Diagnosis present

## 2015-08-03 DIAGNOSIS — I10 Essential (primary) hypertension: Secondary | ICD-10-CM | POA: Diagnosis not present

## 2015-08-03 DIAGNOSIS — E1159 Type 2 diabetes mellitus with other circulatory complications: Secondary | ICD-10-CM | POA: Diagnosis not present

## 2015-08-03 DIAGNOSIS — E119 Type 2 diabetes mellitus without complications: Secondary | ICD-10-CM

## 2015-08-03 DIAGNOSIS — I6789 Other cerebrovascular disease: Secondary | ICD-10-CM | POA: Diagnosis not present

## 2015-08-03 DIAGNOSIS — I340 Nonrheumatic mitral (valve) insufficiency: Secondary | ICD-10-CM | POA: Diagnosis not present

## 2015-08-03 DIAGNOSIS — E1122 Type 2 diabetes mellitus with diabetic chronic kidney disease: Secondary | ICD-10-CM | POA: Diagnosis present

## 2015-08-03 DIAGNOSIS — Z9119 Patient's noncompliance with other medical treatment and regimen: Secondary | ICD-10-CM | POA: Diagnosis not present

## 2015-08-03 HISTORY — DX: Type 2 diabetes mellitus without complications: E11.9

## 2015-08-03 LAB — CBC
HEMATOCRIT: 40.9 % (ref 36.0–46.0)
HEMATOCRIT: 35.7 % — AB (ref 36.0–46.0)
HEMOGLOBIN: 12.9 g/dL (ref 12.0–15.0)
HEMOGLOBIN: 11.2 g/dL — AB (ref 12.0–15.0)
MCH: 29.5 pg (ref 26.0–34.0)
MCH: 28.7 pg (ref 26.0–34.0)
MCHC: 31.5 g/dL (ref 30.0–36.0)
MCHC: 31.4 g/dL (ref 30.0–36.0)
MCV: 93.4 fL (ref 78.0–100.0)
MCV: 91.5 fL (ref 78.0–100.0)
Platelets: 188 10*3/uL (ref 150–400)
Platelets: 191 10*3/uL (ref 150–400)
RBC: 4.38 MIL/uL (ref 3.87–5.11)
RBC: 3.9 MIL/uL (ref 3.87–5.11)
RDW: 13.7 % (ref 11.5–15.5)
RDW: 13.4 % (ref 11.5–15.5)
WBC: 6.6 10*3/uL (ref 4.0–10.5)
WBC: 5.7 10*3/uL (ref 4.0–10.5)

## 2015-08-03 LAB — URINALYSIS, ROUTINE W REFLEX MICROSCOPIC
BILIRUBIN URINE: NEGATIVE
Glucose, UA: NEGATIVE mg/dL
Hgb urine dipstick: NEGATIVE
KETONES UR: NEGATIVE mg/dL
NITRITE: NEGATIVE
PH: 7 (ref 5.0–8.0)
Protein, ur: NEGATIVE mg/dL
SPECIFIC GRAVITY, URINE: 1.012 (ref 1.005–1.030)

## 2015-08-03 LAB — BASIC METABOLIC PANEL
Anion gap: 10 (ref 5–15)
BUN: 14 mg/dL (ref 6–20)
CHLORIDE: 112 mmol/L — AB (ref 101–111)
CO2: 25 mmol/L (ref 22–32)
CREATININE: 1.25 mg/dL — AB (ref 0.44–1.00)
Calcium: 10 mg/dL (ref 8.9–10.3)
GFR, EST AFRICAN AMERICAN: 48 mL/min — AB (ref >60)
GFR, EST NON AFRICAN AMERICAN: 41 mL/min — AB (ref >60)
Glucose, Bld: 78 mg/dL (ref 65–99)
Potassium: 4.5 mmol/L (ref 3.5–5.1)
SODIUM: 147 mmol/L — AB (ref 135–145)

## 2015-08-03 LAB — URINE MICROSCOPIC-ADD ON

## 2015-08-03 LAB — CREATININE, SERUM
Creatinine, Ser: 1.19 mg/dL — ABNORMAL HIGH (ref 0.44–1.00)
GFR calc non Af Amer: 44 mL/min — ABNORMAL LOW (ref >60)
GFR, EST AFRICAN AMERICAN: 50 mL/min — AB (ref >60)

## 2015-08-03 LAB — PROTIME-INR
INR: 1.07 (ref 0.00–1.49)
Prothrombin Time: 14.1 seconds (ref 11.6–15.2)

## 2015-08-03 LAB — CBG MONITORING, ED: GLUCOSE-CAPILLARY: 95 mg/dL (ref 65–99)

## 2015-08-03 MED ORDER — SODIUM CHLORIDE 0.9 % IV BOLUS (SEPSIS)
500.0000 mL | Freq: Once | INTRAVENOUS | Status: AC
  Administered 2015-08-03: 500 mL via INTRAVENOUS

## 2015-08-03 MED ORDER — ROSUVASTATIN CALCIUM 5 MG PO TABS
5.0000 mg | ORAL_TABLET | Freq: Every day | ORAL | Status: DC
  Administered 2015-08-03 – 2015-08-07 (×5): 5 mg via ORAL
  Filled 2015-08-03 (×6): qty 1

## 2015-08-03 MED ORDER — SENNOSIDES-DOCUSATE SODIUM 8.6-50 MG PO TABS: 1.0000 | ORAL_TABLET | Freq: Every evening | ORAL | Status: DC | PRN

## 2015-08-03 MED ORDER — STROKE: EARLY STAGES OF RECOVERY BOOK
Freq: Once | Status: AC
  Administered 2015-08-03: 18:00:00
  Filled 2015-08-03: qty 1

## 2015-08-03 MED ORDER — VALSARTAN-HYDROCHLOROTHIAZIDE 160-12.5 MG PO TABS: 1.0000 | ORAL_TABLET | Freq: Every day | ORAL | Status: DC

## 2015-08-03 MED ORDER — ASPIRIN 81 MG PO CHEW
324.0000 mg | CHEWABLE_TABLET | Freq: Every day | ORAL | Status: DC
  Administered 2015-08-04: 324 mg via ORAL
  Filled 2015-08-03 (×2): qty 4

## 2015-08-03 MED ORDER — IRBESARTAN 150 MG PO TABS
150.0000 mg | ORAL_TABLET | Freq: Every day | ORAL | Status: DC
  Administered 2015-08-03 – 2015-08-04 (×2): 150 mg via ORAL
  Filled 2015-08-03 (×2): qty 1

## 2015-08-03 MED ORDER — AMLODIPINE BESYLATE 10 MG PO TABS
10.0000 mg | ORAL_TABLET | Freq: Every day | ORAL | Status: DC
  Administered 2015-08-03: 10 mg via ORAL
  Filled 2015-08-03: qty 1

## 2015-08-03 MED ORDER — ASPIRIN 81 MG PO CHEW
324.0000 mg | CHEWABLE_TABLET | Freq: Once | ORAL | Status: AC
  Administered 2015-08-03: 324 mg via ORAL
  Filled 2015-08-03: qty 4

## 2015-08-03 MED ORDER — ENOXAPARIN SODIUM 40 MG/0.4ML ~~LOC~~ SOLN
40.0000 mg | SUBCUTANEOUS | Status: DC
  Administered 2015-08-03 – 2015-08-06 (×4): 40 mg via SUBCUTANEOUS
  Filled 2015-08-03 (×4): qty 0.4

## 2015-08-03 MED ORDER — HYDROCHLOROTHIAZIDE 12.5 MG PO CAPS
12.5000 mg | ORAL_CAPSULE | Freq: Every day | ORAL | Status: DC
Start: 2015-08-03 — End: 2015-08-03
  Administered 2015-08-03: 12.5 mg via ORAL
  Filled 2015-08-03: qty 1

## 2015-08-03 NOTE — ED Provider Notes (Signed)
CSN: [id number]     Arrival date & time 08/03/15  1027 History   First MD Initiated Contact with Patient 08/03/15 1209     Chief Complaint  Patient presents with  . Dizziness     (Consider location/radiation/quality/duration/timing/severity/associated sxs/prior Treatment) HPI Comments: 75 year old female history of lipids, high blood pressure, stroke without significant deficit, depressed mood presents with significant other for worsening dizziness since this morning upon awakening. No history of similar. No active bleeding, no chest measurements of breath. No headache. Worse with standing and walking balance feels unsteady.  Significant other took me aside and had concerns about possible Alzheimer's as patient has had increased agitation and anger with mild changes in behavior recently.  Patient is a 75 y.o. female presenting with dizziness. The history is provided by the patient and the spouse.  Dizziness Associated symptoms: no chest pain, no headaches, no shortness of breath and no vomiting     Past Medical History  Diagnosis Date  . Hyperlipidemia   . Hypertension   . Diabetes mellitus without complication (HCC)    History reviewed. No pertinent past surgical history. No family history on file. Social History  Substance Use Topics  . Smoking status: Never Smoker   . Smokeless tobacco: Never Used  . Alcohol Use: No   OB History    No data available     Review of Systems  Constitutional: Negative for fever and chills.  HENT: Negative for congestion.   Eyes: Negative for visual disturbance.  Respiratory: Negative for shortness of breath.   Cardiovascular: Negative for chest pain.  Gastrointestinal: Negative for vomiting and abdominal pain.  Genitourinary: Negative for dysuria and flank pain.  Musculoskeletal: Negative for back pain, neck pain and neck stiffness.  Skin: Negative for rash.  Neurological: Positive for dizziness and light-headedness. Negative for  headaches.  Psychiatric/Behavioral: Positive for behavioral problems.      Allergies  Hydrogen peroxide and Sulfa antibiotics  Home Medications   Prior to Admission medications   Medication Sig Start Date End Date Taking? Authorizing Provider  amLODipine (NORVASC) 10 MG tablet TAKE 1 TABLET DAILY 02/03/15  Yes Dianne Dun, MD  Glucosamine-Chondroit-Vit C-Mn (GLUCOSAMINE 1500 COMPLEX) CAPS Take 1 capsule by mouth daily.    Yes Historical Provider, MD  mirtazapine (REMERON) 15 MG tablet Take 1 tablet (15 mg total) by mouth at bedtime. 03/31/15  Yes Dianne Dun, MD  Omega-3 Fatty Acids (FISH OIL) 1000 MG CAPS Take 1 capsule by mouth daily.     Yes Historical Provider, MD  rosuvastatin (CRESTOR) 5 MG tablet Take 1 tablet (5 mg total) by mouth daily. 07/04/15  Yes Dianne Dun, MD  valsartan-hydrochlorothiazide (DIOVAN-HCT) 160-12.5 MG tablet Take 1 tablet by mouth daily. 04/18/15  Yes Dianne Dun, MD   BP 136/76 mmHg  Pulse 65  Temp(Src) 97.8 F (36.6 C) (Oral)  Resp 14  Ht  (1.676 m)  Wt 142 lb (64.411 kg)  BMI 22.93 kg/m2  SpO2 97% Physical Exam  Constitutional: She is oriented to person, place, and time. She appears well-developed and well-nourished.  HENT:  Head: Normocephalic and atraumatic.  Eyes: Conjunctivae are normal. Right eye exhibits no discharge. Left eye exhibits no discharge.  Neck: Normal range of motion. Neck supple. No tracheal deviation present.  Cardiovascular: Normal rate and regular rhythm.   No murmur heard. Pulmonary/Chest: Effort normal and breath sounds normal.  Abdominal: Soft. She exhibits no distension. There is no tenderness. There is no guarding.  Musculoskeletal:  She exhibits no edema.  Neurological: She is alert and oriented to person, place, and time.  Cautious gait, patient has to hold onto something. Patient has equal strength 5+ with flexion-extension of all extremities. Sensation intact bilateral. Cranial nerves intact. Finger-nose  intact, visual fields intact to fingers.  Skin: Skin is warm. No rash noted.  Psychiatric: She has a normal mood and affect.  Nursing note and vitals reviewed.   ED Course  Procedures (including critical care time) Labs Review Labs Reviewed  URINALYSIS, ROUTINE W REFLEX MICROSCOPIC (NOT AT [facility]) - Abnormal; Notable for the following:    APPearance CLOUDY (*)    Leukocytes, UA LARGE (*)    All other components within normal limits  BASIC METABOLIC PANEL - Abnormal; Notable for the following:    Sodium 147 (*)    Chloride 112 (*)    Creatinine, Ser 1.25 (*)    GFR calc non Af Amer 41 (*)    GFR calc Af Amer 48 (*)    All other components within normal limits  URINE MICROSCOPIC-ADD ON - Abnormal; Notable for the following:    Squamous Epithelial / LPF 6-30 (*)    Bacteria, UA FEW (*)    All other components within normal limits  URINE CULTURE  CBC  CBG MONITORING, ED    Imaging Review Mr Brain Wo Contrast  08/03/2015  CLINICAL DATA:  Increasing lightheadedness. History of hypertension, diabetes, and hyperlipidemia. Initial encounter. EXAM: MRI HEAD WITHOUT CONTRAST TECHNIQUE: Multiplanar, multiecho pulse sequences of the brain and surrounding structures were obtained without intravenous contrast. COMPARISON:  MR head 02/11/2007. FINDINGS: The patient was unable to remain motionless for the exam. Small or subtle lesions could be overlooked. There is a small focus of restricted diffusion along the RIGHT lateral/inferior margin of the temporal lobe. This is nonhemorrhagic. This corresponds to RIGHT MCA vascular territory involvement. No other areas of restricted diffusion are seen. No acute hemorrhage, mass lesion, or extra-axial hematoma. Generalized atrophy. Hydrocephalus ex vacuo. Extensive chronic microvascular ischemic change. Widespread areas of chronic cerebral and cerebellar infarction, including RIGHT frontal, LEFT parieto-occipital, and BILATERAL cerebellar, without significant  chronic hemorrhage. BILATERAL subdural hygromas/ prominence of the extracerebral CSF spaces due to atrophy. Flow voids are maintained. No midline abnormality. Extracranial soft tissues unremarkable. IMPRESSION: Small focus of acute infarction affecting the RIGHT lateral and inferior temporal lobe. This is nonhemorrhagic. Extensive atrophy and chronic microvascular ischemic change with numerous areas of chronic infarction. No evidence for large vessel occlusion. Electronically Signed   By: Elsie Stain M.D.   On: 08/03/2015 14:12   I have personally reviewed and evaluated these images and lab results as part of my medical decision-making.   EKG Interpretation   Date/Time:  Sunday August 03 2015 11:26:28 EST Ventricular Rate:  66 PR Interval:  172 QRS Duration: 100 QT Interval:  455 QTC Calculation: 477 R Axis:   45 Text Interpretation:  Sinus rhythm Low voltage, precordial leads Confirmed  by Jordy Hewins  MD, Keajah Killough ([id number]) on 08/03/2015 11:36:57 AM      MDM   Final diagnoses:  Acute CVA (cerebrovascular accident) (HCC)  Dizziness   Patient presents with dizziness since this morning. Plan for screening blood work to look for signs of anemia electrolyte imbalance. Small fluid bolus given. With difficulty with ambulation plan for MRI to look for posterior stroke. Husband concern for worsening agitation recently. Plan for urinalysis to look for secondary causes.  MRI results reviewed consistent with acute and chronic stroke. Discussed with  neurology, aspirin given. Neurology agrees with medicine admitting. Try hospitalist paged and plan for admission telemetry.  The patients results and plan were reviewed and discussed.   Any x-rays performed were independently reviewed by myself.   Differential diagnosis were considered with the presenting HPI.  Medications  aspirin chewable tablet 324 mg (not administered)  sodium chloride 0.9 % bolus 500 mL (500 mLs Intravenous New Bag/Given 08/03/15  1407)    Filed Vitals:   08/03/15 1400 08/03/15 1430 08/03/15 1445 08/03/15 1500  BP: 134/78 131/68 141/87 136/76  Pulse: 60 64 63 65  Temp:      TempSrc:      Resp: Height:      Weight:      SpO2: 97% 98% 97% 97%    Final diagnoses:  Acute CVA (cerebrovascular accident) (HCC)  Dizziness    Admission/ observation were discussed with the admitting physician, patient and/or family and they are comfortable with the plan.    Blane Ohara, MD 08/03/15 [phone]

## 2015-08-03 NOTE — Consult Note (Signed)
Admission H&P    Chief Complaint: Slurred speech and dizziness.  HPI: Sherri [name] is an 75 y.o. female with a history of hypertension, diabetes mellitus and hyperlipidemia presenting with new onset dizziness with gait difficulty as well as slurred speech. Symptoms were present when patient woke up this morning. She has a previous history of TIA. MRI of her brain showed a small right inferior and posterior temporal ischemic infarction. Multiple old infarctions and chronic microvascular changes were noted, as well. NIH stroke score was the time of this evaluation was 0. Speech has returned to normal. She is no longer experiencing dizziness. She had no focal extremity weakness no numbness.  LSN: 08/02/2015 p.m. tPA Given: No: Deficits resolved; beyond time window for treatment consideration mRankin:  Past Medical History  Diagnosis Date  . Hyperlipidemia   . Hypertension   . Diabetes mellitus without complication ([place])     History reviewed. No pertinent past surgical history.  No family history on file. Social History:  reports that she has never smoked. She has never used smokeless tobacco. She reports that she does not drink alcohol or use illicit drugs.  Allergies:  Allergies  Allergen Reactions  . Hydrogen Peroxide Swelling  . Sulfa Antibiotics     Medications Prior to Admission  Medication Sig Dispense Refill  . amLODipine (NORVASC) 10 MG tablet TAKE 1 TABLET DAILY 90 tablet 1  . Glucosamine-Chondroit-Vit C-Mn (GLUCOSAMINE 1500 COMPLEX) CAPS Take 1 capsule by mouth daily.     . mirtazapine (REMERON) 15 MG tablet Take 1 tablet (15 mg total) by mouth at bedtime. 30 tablet 3  . Omega-3 Fatty Acids (FISH OIL) 1000 MG CAPS Take 1 capsule by mouth daily.      . rosuvastatin (CRESTOR) 5 MG tablet Take 1 tablet (5 mg total) by mouth daily. 90 tablet 3  . valsartan-hydrochlorothiazide (DIOVAN-HCT) 160-12.5 MG tablet Take 1 tablet by mouth daily. 90 tablet 3    ROS: History  obtained from chart review and the patient  General ROS: negative for - chills, fatigue, fever, night sweats, weight gain or weight loss Psychological ROS: negative for - behavioral disorder, hallucinations, memory difficulties, mood swings or suicidal ideation Ophthalmic ROS: negative for - blurry vision, double vision, eye pain or loss of vision ENT ROS: negative for - epistaxis, nasal discharge, oral lesions, sore throat, tinnitus or vertigo Allergy and Immunology ROS: negative for - hives or itchy/watery eyes Hematological and Lymphatic ROS: negative for - bleeding problems, bruising or swollen lymph nodes Endocrine ROS: negative for - galactorrhea, hair pattern changes, polydipsia/polyuria or temperature intolerance Respiratory ROS: negative for - cough, hemoptysis, shortness of breath or wheezing Cardiovascular ROS: negative for - chest pain, dyspnea on exertion, edema or irregular heartbeat Gastrointestinal ROS: negative for - abdominal pain, diarrhea, hematemesis, nausea/vomiting or stool incontinence Genito-Urinary ROS: negative for - dysuria, hematuria, incontinence or urinary frequency/urgency Musculoskeletal ROS: negative for - joint swelling or muscular weakness Neurological ROS: as noted in HPI Dermatological ROS: negative for rash and skin lesion changes  Physical Examination: Blood pressure 136/90, pulse 69, temperature 98.4 F (36.9 C), temperature source Oral, resp. rate 18, height 5' 6"  (1.676 m), weight 64.411 kg (142 lb), SpO2 100 %.  HEENT-  Normocephalic, no lesions, without obvious abnormality.  Normal external eye and conjunctiva.  Normal TM's bilaterally.  Normal auditory canals and external ears. Normal external nose, mucus membranes and septum.  Normal pharynx. Neck supple with no masses, nodes, nodules or enlargement. Cardiovascular - regular rate and  rhythm, S1, S2 normal, no murmur, click, rub or gallop Lungs - chest clear, no wheezing, rales, normal symmetric  air entry Abdomen - soft, non-tender; bowel sounds normal; no masses,  no organomegaly Extremities - no joint deformities, effusion, or inflammation and no edema  Neurologic Examination: Mental Status: Alert, oriented, thought content appropriate.  Speech fluent without evidence of aphasia. Able to follow commands without difficulty. Cranial Nerves: II-Visual fields were normal except for difficulty with finger counting with apparent right inferior quadrantanopsia. III/IV/VI-Pupils were equal and reacted normally to light. Extraocular movements were full and conjugate.    V/VII-no facial numbness and no facial weakness. VIII-normal. X-normal speech and symmetrical palatal movement. XI: trapezius strength/neck flexion strength normal bilaterally XII-midline tongue extension with normal strength. Motor: 5/5 bilaterally with normal tone and bulk Sensory: Normal throughout. Deep Tendon Reflexes: 1+ and symmetric. Plantars: Mute bilaterally Cerebellar: Normal finger-to-nose testing. Carotid auscultation: Normal  Results for orders placed or performed during the hospital encounter of 08/03/15 (from the past 48 hour(s))  CBG monitoring, ED     Status: None   Collection Time: 08/03/15 11:31 AM  Result Value Ref Range   Glucose-Capillary 95 65 - 99 mg/dL  CBC     Status: None   Collection Time: 08/03/15 12:02 PM  Result Value Ref Range   WBC 6.6 4.0 - 10.5 K/uL   RBC 4.38 3.87 - 5.11 MIL/uL   Hemoglobin 12.9 12.0 - 15.0 g/dL   HCT 40.9 36.0 - 46.0 %   MCV 93.4 78.0 - 100.0 fL   MCH 29.5 26.0 - 34.0 pg   MCHC 31.5 30.0 - 36.0 g/dL   RDW 13.7 11.5 - 15.5 %   Platelets 188 150 - 400 K/uL  Basic metabolic panel     Status: Abnormal   Collection Time: 08/03/15  2:06 PM  Result Value Ref Range   Sodium 147 (H) 135 - 145 mmol/L   Potassium 4.5 3.5 - 5.1 mmol/L   Chloride 112 (H) 101 - 111 mmol/L   CO2 25 22 - 32 mmol/L   Glucose, Bld 78 65 - 99 mg/dL   BUN 14 6 - 20 mg/dL    Creatinine, Ser 1.25 (H) 0.44 - 1.00 mg/dL   Calcium 10.0 8.9 - 10.3 mg/dL   GFR calc non Af Amer 41 (L) >60 mL/min   GFR calc Af Amer 48 (L) >60 mL/min    Comment: (NOTE) The eGFR has been calculated using the CKD EPI equation. This calculation has not been validated in all clinical situations. eGFR's persistently <60 mL/min signify possible Chronic Kidney Disease.    Anion gap 10 5 - 15  Urinalysis, Routine w reflex microscopic (not at [facility])     Status: Abnormal   Collection Time: 08/03/15  2:16 PM  Result Value Ref Range   Color, Urine YELLOW YELLOW   APPearance CLOUDY (A) CLEAR   Specific Gravity, Urine 1.012 1.005 - 1.030   pH 7.0 5.0 - 8.0   Glucose, UA NEGATIVE NEGATIVE mg/dL   Hgb urine dipstick NEGATIVE NEGATIVE   Bilirubin Urine NEGATIVE NEGATIVE   Ketones, ur NEGATIVE NEGATIVE mg/dL   Protein, ur NEGATIVE NEGATIVE mg/dL   Nitrite NEGATIVE NEGATIVE   Leukocytes, UA LARGE (A) NEGATIVE  Urine microscopic-add on     Status: Abnormal   Collection Time: 08/03/15  2:16 PM  Result Value Ref Range   Squamous Epithelial / LPF 6-30 (A) NONE SEEN   WBC, UA 6-30 0 - 5 WBC/hpf   RBC /  HPF 0-5 0 - 5 RBC/hpf   Bacteria, UA FEW (A) NONE SEEN  Protime-INR     Status: None   Collection Time: 08/03/15  6:08 PM  Result Value Ref Range   Prothrombin Time 14.1 11.6 - 15.2 seconds   INR 1.07 0.00 - 1.49  CBC     Status: Abnormal   Collection Time: 08/03/15  6:08 PM  Result Value Ref Range   WBC 5.7 4.0 - 10.5 K/uL   RBC 3.90 3.87 - 5.11 MIL/uL   Hemoglobin 11.2 (L) 12.0 - 15.0 g/dL   HCT 35.7 (L) 36.0 - 46.0 %   MCV 91.5 78.0 - 100.0 fL   MCH 28.7 26.0 - 34.0 pg   MCHC 31.4 30.0 - 36.0 g/dL   RDW 13.4 11.5 - 15.5 %   Platelets 191 150 - 400 K/uL  Creatinine, serum     Status: Abnormal   Collection Time: 08/03/15  6:08 PM  Result Value Ref Range   Creatinine, Ser 1.19 (H) 0.44 - 1.00 mg/dL   GFR calc non Af Amer 44 (L) >60 mL/min   GFR calc Af Amer 50 (L) >60 mL/min     Comment: (NOTE) The eGFR has been calculated using the CKD EPI equation. This calculation has not been validated in all clinical situations. eGFR's persistently <60 mL/min signify possible Chronic Kidney Disease.    Mr Brain Wo Contrast  08/03/2015  CLINICAL DATA:  Increasing lightheadedness. History of hypertension, diabetes, and hyperlipidemia. Initial encounter. EXAM: MRI HEAD WITHOUT CONTRAST TECHNIQUE: Multiplanar, multiecho pulse sequences of the brain and surrounding structures were obtained without intravenous contrast. COMPARISON:  MR head 02/11/2007. FINDINGS: The patient was unable to remain motionless for the exam. Small or subtle lesions could be overlooked. There is a small focus of restricted diffusion along the RIGHT lateral/inferior margin of the temporal lobe. This is nonhemorrhagic. This corresponds to RIGHT MCA vascular territory involvement. No other areas of restricted diffusion are seen. No acute hemorrhage, mass lesion, or extra-axial hematoma. Generalized atrophy. Hydrocephalus ex vacuo. Extensive chronic microvascular ischemic change. Widespread areas of chronic cerebral and cerebellar infarction, including RIGHT frontal, LEFT parieto-occipital, and BILATERAL cerebellar, without significant chronic hemorrhage. BILATERAL subdural hygromas/ prominence of the extracerebral CSF spaces due to atrophy. Flow voids are maintained. No midline abnormality. Extracranial soft tissues unremarkable. IMPRESSION: Small focus of acute infarction affecting the RIGHT lateral and inferior temporal lobe. This is nonhemorrhagic. Extensive atrophy and chronic microvascular ischemic change with numerous areas of chronic infarction. No evidence for large vessel occlusion. Electronically Signed   By: Staci Righter M.D.   On: 08/03/2015 14:12    Assessment: 75 y.o. female with multiple risk factors for stroke presenting with an acute right posterior and inferior temporal ischemic  infarction. It's unclear  if this lesion is primary cause for her symptoms of dizziness and unsteady gait and transient slurring of speech. Lesion is also likely unrelated to left inferior visual field changes described above. No signs of acute nor chronic left right lower occipital infarction were present on MRI study.  Stroke Risk Factors - diabetes mellitus, hyperlipidemia and hypertension  Plan: 1. HgbA1c, fasting lipid panel 2. MRA  of the brain without contrast 3. PT consult, OT consult, Speech consult 4. Echocardiogram 5. Carotid dopplers 6. Prophylactic therapy-Antiplatelet med: Aspirin  7. Risk factor modification 8. Telemetry monitoring  C.R. Nicole Kindred, MD [facility] [phone]  08/03/2015, 7:55 PM

## 2015-08-03 NOTE — Progress Notes (Signed)
Pt arrived to 5C20 via stretcher.  Pt conversant.  VSS.  Family at bedside.  Telemetry applied and CCMD notified.  Will continue to monitor. Sondra Come, RN

## 2015-08-03 NOTE — H&P (Signed)
[facility] History and Physical  Sherri [name] ZOX:[id number] DOB: 10-18-1940 DOA: 08/03/2015  Referring physician:  PCP: Ruthe Mannan, MD   Chief Complaint: Stroke  HPI: Sherri [name] is a 75 y.o. 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 No headache, blurred vision or double vision was reported. She admits to some dysarthria but cannot elaborate. Husband reported unsteady gait. No seizures were witnessed. No active bleeding, or shortness of breath. She had some nausea without vomiting this morning. Per chart report, she may not have been on aspirin for at least 6 months.The patient has questionable compliance to her medications. Of note, the patient may have "mini stroke " in the past, but is unable to be proven. At the ED, her vital signs are stable, she is afebrile. MRI of the brain without contrast shows small focus of acute infarction on the right lateral and inferior temporal lobe, non hemorrhagic. There are numerous areas of chronic infarction. No evidence of large vessel occlusion. She received ASA 325 mgx1. Neurology consultation is pending.    Review of Systems  Constitutional: Negative.   HENT: Negative.   Eyes: Negative.   Respiratory: Negative.   Cardiovascular: Negative.   Gastrointestinal: Positive for nausea.  Genitourinary: Negative.   Musculoskeletal: Negative.   Skin: Negative.   Neurological: Positive for dizziness, speech change and focal weakness. Negative for tingling, tremors, sensory change, seizures and loss of consciousness.  Psychiatric/Behavioral: Positive for memory loss.       Confusion in the setting of dementia    Past Medical History  Diagnosis Date  . Hyperlipidemia   . Hypertension   . Diabetes mellitus without complication (HCC)    History  reviewed. No pertinent past surgical history. Social History:  reports that she has never smoked. She has never used smokeless tobacco. She reports that she does not drink alcohol or use illicit drugs.  Allergies  Allergen Reactions  . Hydrogen Peroxide Swelling  . Sulfa Antibiotics     No family history on file.   Prior to Admission medications   Medication Sig Start Date End Date Taking? Authorizing Provider  amLODipine (NORVASC) 10 MG tablet TAKE 1 TABLET DAILY 02/03/15  Yes Dianne Dun, MD  Glucosamine-Chondroit-Vit C-Mn (GLUCOSAMINE 1500 COMPLEX) CAPS Take 1 capsule by mouth daily.    Yes Historical Provider, MD  mirtazapine (REMERON) 15 MG tablet Take 1 tablet (15 mg total) by mouth at bedtime. 03/31/15  Yes Dianne Dun, MD  Omega-3 Fatty Acids (FISH OIL) 1000 MG CAPS Take 1 capsule by mouth daily.     Yes Historical Provider, MD  rosuvastatin (CRESTOR) 5 MG tablet Take 1 tablet (5 mg total) by mouth daily. 07/04/15  Yes Dianne Dun, MD  valsartan-hydrochlorothiazide (DIOVAN-HCT) 160-12.5 MG tablet Take 1 tablet by mouth daily. 04/18/15  Yes Dianne Dun, MD   Physical Exam: Filed Vitals:   08/03/15 1545 08/03/15 1552 08/03/15 1637 08/03/15 1700  BP: 147/82  144/71 143/60  Pulse: 60  62 84  Temp:  97.8 F (36.6 C) 97.9 F (36.6 C) 97.9 F (36.6 C)  TempSrc:   Oral Oral  Resp: Height:      Weight:      SpO2: 97%  99% 98%    Wt Readings from Last 3 Encounters:  08/03/15 64.411 kg (142 lb)  07/03/15 64.411 kg (  142 lb)  03/24/15 65.431 kg (144 lb 4 oz)    Physical Exam  Constitutional: She is well-developed, well-nourished, and in no distress.  HENT:  Head: Normocephalic and atraumatic.  Mouth/Throat: No oropharyngeal exudate.  Many missing teeth  Eyes: EOM are normal. Pupils are equal, round, and reactive to light. No scleral icterus.  Neck: Normal range of motion. Neck supple.  Cardiovascular: Normal rate and regular rhythm.  Exam reveals no gallop  and no friction rub.   No murmur heard. Pulmonary/Chest: Effort normal and breath sounds normal.  Abdominal: Soft. Bowel sounds are normal. She exhibits no mass. There is no tenderness.  Musculoskeletal: She exhibits no edema or tenderness.  Neurological: She displays normal reflexes. She exhibits normal muscle tone.  Normal strength and sensation Follows simple commands Gait unable to be tested at this time   Skin: Skin is warm and dry.  Psychiatric:  Dementia             Labs on Admission:  Basic Metabolic Panel:  Recent Labs Lab 08/03/15 1406  NA 147*  K 4.5  CL 112*  CO2 25  GLUCOSE 78  BUN 14  CREATININE 1.25*  CALCIUM 10.0    Liver Function Tests: No results for input(s): AST, ALT, ALKPHOS, BILITOT, PROT, ALBUMIN in the last 168 hours. No results for input(s): LIPASE, AMYLASE in the last 168 hours. No results for input(s): AMMONIA in the last 168 hours.  CBC:  Recent Labs Lab 08/03/15 1202  WBC 6.6  HGB 12.9  HCT 40.9  MCV 93.4  PLT 188    Cardiac Enzymes: No results for input(s): CKTOTAL, CKMB, CKMBINDEX, TROPONINI in the last 168 hours.  BNP (last 3 results) No results for input(s): BNP in the last 8760 hours.  ProBNP (last 3 results) No results for input(s): PROBNP in the last 8760 hours.   CREATININE: 1.25 mg/dL ABNORMAL (16/10/96 0454) Estimated creatinine clearance - 36.4 mL/min  CBG:  Recent Labs Lab 08/03/15 1131  GLUCAP 95    Radiological Exams on Admission: Mr Brain Wo Contrast  08/03/2015  CLINICAL DATA:  Increasing lightheadedness. History of hypertension, diabetes, and hyperlipidemia. Initial encounter. EXAM: MRI HEAD WITHOUT CONTRAST TECHNIQUE: Multiplanar, multiecho pulse sequences of the brain and surrounding structures were obtained without intravenous contrast. COMPARISON:  MR head 02/11/2007. FINDINGS: The patient was unable to remain motionless for the exam. Small or subtle lesions could be overlooked. There is a  small focus of restricted diffusion along the RIGHT lateral/inferior margin of the temporal lobe. This is nonhemorrhagic. This corresponds to RIGHT MCA vascular territory involvement. No other areas of restricted diffusion are seen. No acute hemorrhage, mass lesion, or extra-axial hematoma. Generalized atrophy. Hydrocephalus ex vacuo. Extensive chronic microvascular ischemic change. Widespread areas of chronic cerebral and cerebellar infarction, including RIGHT frontal, LEFT parieto-occipital, and BILATERAL cerebellar, without significant chronic hemorrhage. BILATERAL subdural hygromas/ prominence of the extracerebral CSF spaces due to atrophy. Flow voids are maintained. No midline abnormality. Extracranial soft tissues unremarkable. IMPRESSION: Small focus of acute infarction affecting the RIGHT lateral and inferior temporal lobe. This is nonhemorrhagic. Extensive atrophy and chronic microvascular ischemic change with numerous areas of chronic infarction. No evidence for large vessel occlusion. Electronically Signed   By: Elsie Stain M.D.   On: 08/03/2015 14:12    EKG [id number] with Sinus Rythm   Assessment/Plan Principal Problem:   Acute CVA (cerebrovascular accident) [facility]) Active Problems:   Hyperlipidemia   Hypertension   CVA (cerebral infarction)   Diabetes (HCC)  Acute CVA Possible stroke in the past, not complete documentation available at this time. Patient and family poor historians. May have not been on ASA for the last 6 months, due to medicine non-compliance in the setting of dementia Symptoms improved, unable to assess gait at this time.MRI brain without contrast shows small focus of acute infarction on the right lateral and inferior temporal lobe, non hemorrhagic. There are numerous areas of chronic infarction. No evidence of large vessel occlusion. She received ASA 325 mgx1.   Admit to telemetry Neurology consultation is pending -Echocardiogram and carotid duplex   -PT/OT/SLP Aspirin     Hypertension Patient with history of noncompliance. BP 144/71 mmHg  Pulse 62, controlled Continue Norvasc, Diovan Monitor step down telemetry  Hyperlipidemia  Lipid studies pending Continue Crestor  Type II Diabetes HbA1C on 03/2014 5.8 Check Hgb A1C Hold antiglycemics for now, until A1C results She has not been compliant with oral agents. Current Glucose is 78 and she is NPO until swallow evaluation.   Place on heart healthy carb modified diet after passing swallow evaluation  UTI suggested by large WBC in urine. Nitrite negative WBC in blood normal at 6.6. Afebrile. -F/u urine culture    Code Status: Full Code  DVT Prophylaxis: Lovenox Family Communication: at bedside Disposition Plan: Pending Improvement. Admitted for observation in tele bed. Expected LOS 24-48 hrs    [facility] E,PA-C [facility] [email or web address] Password [email or web address]

## 2015-08-03 NOTE — ED Notes (Signed)
Pt. Still at MRI. 

## 2015-08-03 NOTE — ED Notes (Signed)
Pt endorses waking up this morning as usual went to the bathroom and begin feeling acute lightheaded. Patient sts decreased after sitting down but has been present since. Patient denies taking morning meds. Patient denies chest pain, shortness of breath, headache, slurred speech, aphasia or weakness. Patient is diabetic and sts does not check sugar regularly. Patient sts lightheadedness is worse with standing and no changes with moving head or eyes closed.

## 2015-08-04 ENCOUNTER — Inpatient Hospital Stay (HOSPITAL_COMMUNITY): Payer: Medicare Other

## 2015-08-04 ENCOUNTER — Encounter (HOSPITAL_COMMUNITY): Payer: Self-pay | Admitting: Radiology

## 2015-08-04 DIAGNOSIS — E1159 Type 2 diabetes mellitus with other circulatory complications: Secondary | ICD-10-CM

## 2015-08-04 DIAGNOSIS — I6789 Other cerebrovascular disease: Secondary | ICD-10-CM

## 2015-08-04 DIAGNOSIS — I10 Essential (primary) hypertension: Secondary | ICD-10-CM

## 2015-08-04 DIAGNOSIS — I639 Cerebral infarction, unspecified: Secondary | ICD-10-CM

## 2015-08-04 DIAGNOSIS — Z8673 Personal history of transient ischemic attack (TIA), and cerebral infarction without residual deficits: Secondary | ICD-10-CM

## 2015-08-04 DIAGNOSIS — E118 Type 2 diabetes mellitus with unspecified complications: Secondary | ICD-10-CM

## 2015-08-04 DIAGNOSIS — N189 Chronic kidney disease, unspecified: Secondary | ICD-10-CM

## 2015-08-04 DIAGNOSIS — E785 Hyperlipidemia, unspecified: Secondary | ICD-10-CM

## 2015-08-04 LAB — CBC
HEMATOCRIT: 40.2 % (ref 36.0–46.0)
HEMOGLOBIN: 12.6 g/dL (ref 12.0–15.0)
MCH: 28.7 pg (ref 26.0–34.0)
MCHC: 31.3 g/dL (ref 30.0–36.0)
MCV: 91.6 fL (ref 78.0–100.0)
Platelets: 197 10*3/uL (ref 150–400)
RBC: 4.39 MIL/uL (ref 3.87–5.11)
RDW: 13.3 % (ref 11.5–15.5)
WBC: 4.8 10*3/uL (ref 4.0–10.5)

## 2015-08-04 LAB — URINE CULTURE

## 2015-08-04 LAB — GLUCOSE, CAPILLARY: Glucose-Capillary: 93 mg/dL (ref 65–99)

## 2015-08-04 LAB — BASIC METABOLIC PANEL
ANION GAP: 12 (ref 5–15)
BUN: 12 mg/dL (ref 6–20)
CHLORIDE: 105 mmol/L (ref 101–111)
CO2: 27 mmol/L (ref 22–32)
Calcium: 10 mg/dL (ref 8.9–10.3)
Creatinine, Ser: 1.18 mg/dL — ABNORMAL HIGH (ref 0.44–1.00)
GFR calc non Af Amer: 44 mL/min — ABNORMAL LOW (ref >60)
GFR, EST AFRICAN AMERICAN: 51 mL/min — AB (ref >60)
Glucose, Bld: 98 mg/dL (ref 65–99)
POTASSIUM: 4 mmol/L (ref 3.5–5.1)
Sodium: 144 mmol/L (ref 135–145)

## 2015-08-04 LAB — LIPID PANEL
CHOL/HDL RATIO: 2.5 ratio
CHOLESTEROL: 104 mg/dL (ref 0–200)
HDL: 41 mg/dL (ref >40)
LDL Cholesterol: 49 mg/dL (ref 0–99)
Triglycerides: 69 mg/dL (ref <150)
VLDL: 14 mg/dL (ref 0–40)

## 2015-08-04 MED ORDER — ASPIRIN EC 325 MG PO TBEC
325.0000 mg | DELAYED_RELEASE_TABLET | Freq: Every day | ORAL | Status: DC
  Administered 2015-08-05 – 2015-08-07 (×3): 325 mg via ORAL
  Filled 2015-08-04 (×3): qty 1

## 2015-08-04 MED ORDER — IOHEXOL 350 MG/ML SOLN
50.0000 mL | Freq: Once | INTRAVENOUS | Status: AC | PRN
  Administered 2015-08-04: 50 mL via INTRAVENOUS

## 2015-08-04 MED ORDER — INSULIN ASPART 100 UNIT/ML ~~LOC~~ SOLN
0.0000 [IU] | Freq: Three times a day (TID) | SUBCUTANEOUS | Status: DC
  Administered 2015-08-05: 0 [IU] via SUBCUTANEOUS

## 2015-08-04 MED ORDER — MIRTAZAPINE 15 MG PO TABS
15.0000 mg | ORAL_TABLET | Freq: Every day | ORAL | Status: DC
  Administered 2015-08-04 – 2015-08-06 (×3): 15 mg via ORAL
  Filled 2015-08-04 (×3): qty 1

## 2015-08-04 NOTE — Care Management Note (Signed)
Case Management Note  Patient Details  Name: Sherri [name] MRN: [id number] Date of Birth: 1941/02/01  Subjective/Objective:                    Action/Plan: Patient was admitted with CVA. Lives at home with spouse. Will follow for discharge needs pending PT/OT evals and physician orders.  Expected Discharge Date:                  Expected Discharge Plan:     In-House Referral:     Discharge planning Services     Post Acute Care Choice:    Choice offered to:     DME Arranged:    DME Agency:     HH Arranged:    HH Agency:     Status of Service:  In process, will continue to follow  Medicare Important Message Given:    Date Medicare IM Given:    Medicare IM give by:    Date Additional Medicare IM Given:    Additional Medicare Important Message give by:     If discussed at Long Length of Stay Meetings, dates discussed:    Additional CommentsAnda Kraft, RN 08/04/2015, 11:12 AM [phone]

## 2015-08-04 NOTE — Progress Notes (Signed)
STROKE TEAM PROGRESS NOTE   HISTORY OF PRESENT ILLNESS Sherri [name] is an 75 y.o. female with a history of hypertension, diabetes mellitus and hyperlipidemia presenting with new onset dizziness with gait difficulty as well as slurred speech. Symptoms were present when patient woke up this morning. She has a previous history of TIA. MRI of her brain showed a small right inferior and posterior temporal ischemic infarction. Multiple old infarctions and chronic microvascular changes were noted, as well. NIH stroke score was the time of this evaluation was 0. Speech has returned to normal. She is no longer experiencing dizziness. She had no focal extremity weakness no numbness. She was LKW before bed 08/02/2015. Patient was not administered TPA secondary to Deficits resolved; beyond time window for treatment consideration. She was admitted for further evaluation and treatment.   SUBJECTIVE (INTERVAL HISTORY) Her RN is at the bedside. No family present. Overall she feels her condition is stable. Therapy reports inability to see bilateral lower quadrants.    OBJECTIVE Temp:  [97.8 F (36.6 C)-98.6 F (37 C)] 98.1 F (36.7 C) (02/27 0500) Pulse Rate:  [60-84] 66 (02/27 1036) Cardiac Rhythm:  [-] Normal sinus rhythm (02/27 0751) Resp:  [12-18] 16 (02/27 1036) BP: (100-147)/(50-90) 123/69 mmHg (02/27 1036) SpO2:  [96 %-100 %] 100 % (02/27 1036)  CBC:   Recent Labs Lab 08/03/15 1808 08/04/15 0746  WBC 5.7 4.8  HGB 11.2* 12.6  HCT 35.7* 40.2  MCV 91.5 91.6  PLT 191 197    Basic Metabolic Panel:   Recent Labs Lab 08/03/15 1406 08/03/15 1808 08/04/15 0746  NA 147*  --  144  K 4.5  --  4.0  CL 112*  --  105  CO2 25  --  27  GLUCOSE 78  --  98  BUN 14  --  12  CREATININE 1.25* 1.19* 1.18*  CALCIUM 10.0  --  10.0    Lipid Panel:     Component Value Date/Time   CHOL 104 08/04/2015 0344   TRIG 69 08/04/2015 0344   HDL 41 08/04/2015 0344   CHOLHDL 2.5 08/04/2015 0344   VLDL  14 08/04/2015 0344   LDLCALC 49 08/04/2015 0344   HgbA1c:  Lab Results  Component Value Date   HGBA1C 5.8 03/14/2014   Urine Drug Screen:     Component Value Date/Time   LABOPIA NONE DETECTED 10/13/2010 1647   COCAINSCRNUR NONE DETECTED 10/13/2010 1647   LABBENZ NONE DETECTED 10/13/2010 1647   AMPHETMU NONE DETECTED 10/13/2010 1647   THCU NONE DETECTED 10/13/2010 1647   LABBARB  10/13/2010 1647    NONE DETECTED        DRUG SCREEN FOR MEDICAL PURPOSES ONLY.  IF CONFIRMATION IS NEEDED FOR ANY PURPOSE, NOTIFY LAB WITHIN 5 DAYS.        LOWEST DETECTABLE LIMITS FOR URINE DRUG SCREEN Drug Class       Cutoff (ng/mL) Amphetamine      1000 Barbiturate      200 Benzodiazepine   200 Tricyclics       300 Opiates          300 Cocaine          300 THC              50      IMAGING I have personally reviewed the radiological images below and agree with the radiology interpretations.  Dg Chest 2 View 08/03/2015  No active cardiopulmonary disease.   Ct Angio Head & Neck  W/cm &/or Wo/cm 08/04/2015   1. No acute arterial finding.  No flow limiting stenosis. 2. Minimal atherosclerosis for age and in comparison to the degree of advanced intracranial ischemic injury.   Mr Brain Wo Contrast 08/03/2015  Small focus of acute infarction affecting the RIGHT lateral and inferior temporal lobe. This is nonhemorrhagic. Extensive atrophy and chronic microvascular ischemic change with numerous areas of chronic infarction. No evidence for large vessel occlusion.    LE venous doppler - negative  TTE - - Left ventricle: The cavity size was normal. Wall thickness was normal. Systolic function was normal. The estimated ejection fraction was in the range of 55% to 60%. Wall motion was normal; there were no regional wall motion abnormalities. Left ventricular diastolic function parameters were normal. - Aortic valve: There was trivial regurgitation. - Mitral valve: There was mild to moderate  regurgitation.  CTA head and neck 1. No acute arterial finding. No flow limiting stenosis. 2. Minimal atherosclerosis for age and in comparison to the degree of advanced intracranial ischemic injury.   PHYSICAL EXAM Physical exam  Temp:  [98 F (36.7 C)-98.6 F (37 C)] 98 F (36.7 C) (02/27 1816) Pulse Rate:  [66-80] 79 (02/27 1816) Resp:  [14-18] 18 (02/27 1816) BP: (100-139)/(50-78) 119/70 mmHg (02/27 1816) SpO2:  [97 %-100 %] 98 % (02/27 1816)  General - Well nourished, well developed, in no apparent distress.  Ophthalmologic - Fundi not visualized due to noncooperation.  Cardiovascular - Regular rate and rhythm with no murmur.  Mental Status -  Level of arousal and orientation to time, place, and person were intact. Language including expression, naming, repetition, comprehension was assessed and found intact. Fund of Knowledge was assessed and was intact.  Cranial Nerves II - XII - II - Visual field intact OU, lower part visual field decreased visual acuity. III, IV, VI - Extraocular movements intact V - Facial sensation intact bilaterally. VII - Facial movement intact bilaterally. VIII - Hearing & vestibular intact bilaterally. X - Palate elevates symmetrically, mild dysarthria. XI - Chin turning & shoulder shrug intact bilaterally. XII - Tongue protrusion intact.  Motor Strength - The patient's strength was normal in all extremities and pronator drift was absent.  Bulk was normal and fasciculations were absent.   Motor Tone - Muscle tone was assessed at the neck and appendages and was normal.  Reflexes - The patient's reflexes were 1+ in all extremities and she had no pathological reflexes.  Sensory - Light touch, temperature/pinprick, vibration and proprioception, and Romberg testing were assessed and were symmetrical.    Coordination - The patient had normal movements in the hands and feet with no ataxia or dysmetria.  Tremor was absent.  Gait and Station -  deferred to PT due to safety concerns   ASSESSMENT/PLAN [name] is a 75 y.o. female with history of hypertension, diabetes, hyperlipidemia and TIA presenting with slurred speech, gait difficulty and dizziness. She did not receive IV t-PA due to delay in arrival.   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 Pt also has old right frontal and left parietal infarcts  MRI  Acute right lateral and inferior temporal lobe  CTA Head and neck no flow-limiting stenosis  2D Echo  unremarkable  Lower extremity venous Doppler negative for DVT TEE to look for embolic source. Arranged with [facility] for tomorrow.  If positive for PFO (patent foramen ovale), check bilateral lower  extremity venous dopplers to rule out DVT as possible source of stroke. (I have made patient NPO after midnight tonight). If TEE negative, a [facility] electrophysiologist will consult and consider placement of an implantable loop recorder to evaluate for atrial fibrillation as etiology of stroke. This has been explained to patient/family by Dr. Roda Shutters and they are agreeable.   LDL 49  HgbA1c pending  Lovenox 40 mg sq daily for VTE prophylaxis Diet heart healthy/carb modified Room service appropriate?: Yes; Fluid consistency:: Thin  No antithrombotic prior to admission, now on aspirin 324 milligrams chewable daily.   Patient counseled to be compliant with her antithrombotic medications  Ongoing aggressive stroke risk factor management  Therapy recommendations:  CIR. Consult in place  Disposition:  pending  (lives at home with her husband)  Hypertension  Stable Permissive hypertension (OK if < 220/120) but gradually normalize in 5-7 days  Hyperlipidemia  Home meds:  Crestor 5 and fish oil, Crestor resumed in hospital  LDL 49, goal < 70  Continue statin at discharge  Diabetes type  2  HgbA1c pending, goal < 7.0  Other Stroke Risk Factors  Advanced age  Hx TIA/stroke  10/2010 - R sided weakness  Other Active Problems  Possible UTI, culture pending  Hospital day # 1  Rhoderick Moody [facility] See Amion for Pager information 08/04/2015 1:24 PM   I, the attending vascular neurologist, have personally obtained a history, examined the patient, evaluated laboratory data, individually viewed imaging studies and agree with radiology interpretations. I obtained additional history from pt at bedside. Together with the NP/PA, we formulated the assessment and plan of care which reflects our mutual decision.  I have made any additions or clarifications directly to the above note and agree with the findings and plan as currently documented.   75 yo female with stroke risk factor of HTN, HLD, DM, TIA admitted for right small cortical infarct. Along with old right frontal and left parietal infarcts, embolic stroke more likely. LDL WNL. CTA head and neck, DVT screening and TTE unremarkable. A1C pending. Will need TEE/loop. On ASA and statin now.  Marvel Plan, MD PhD Stroke Neurology 08/04/2015 10:01 PM         To contact Stroke Continuity provider, please refer to [email or web address]. After hours, contact General Neurology

## 2015-08-04 NOTE — Progress Notes (Addendum)
Inpatient Rehabilitation  Patient was screened by Fae Pippin for appropriateness for an Inpatient Acute Rehab consult.  At this time we are recommending an Inpatient Rehab consult; notified Dr. Waymon Amato via text page.  Please order if you are agreeable.    Charlane Ferretti., CCC/SLP Admission Coordinator  [facility] Inpatient Rehabilitation  Cell [phone]

## 2015-08-04 NOTE — Progress Notes (Signed)
  Echocardiogram 2D Echocardiogram has been performed.  Arvil Chaco 08/04/2015, 11:35 AM

## 2015-08-04 NOTE — Progress Notes (Signed)
PROGRESS NOTE    Sherri [name]  YQM:[id number]  DOB: [birth year]  DOA: 08/03/2015 PCP: Ruthe Mannan, MD Outpatient Specialists:   Hospital course: 75 year old female with history of DM, HTN, HLD, prior strokes without significant deficit, possible dementia, presented to [facility] ED on 08/03/15 with complaints of dizziness, gait instability and slurred speech which were present when she woke up on 08/03/15 morning. MRI brain showed a small right inferior and posterior temporal ischemic infarction, multiple old strokes. Neurology consulted and admitted for stroke evaluation.   Assessment & Plan:   Acute CVA - Resultant dizziness, gait instability and slurred speech. Speech normalized. Instability and fall risk persists. - MRI brain: Small focus of acute infarction affecting the right lateral and inferior temporal lobe. Numerous areas of chronic infarction. -  CTA head and neck: No acute arterial finding. No flow-limiting stenosis. - 2-D echo: Pending - LDL: 49 - Hemoglobin A1c: Pending. - Noncompliant with aspirin prior to admission. Now on aspirin 325 MG daily for secondary stroke prophylaxis. - PT recommends CIR consult-order placed. - Neurology consultation appreciated.  Essential hypertension - Allow for permissive hypertension. Hold irbesartan and Diovan for now.  Type II DM - Hemoglobin A1c pending. - Patient apparently noncompliant with home oral medications. - SSI.  Pyuria - Urine microscopy shows few bacteria, large leukocytes but negative nitrites. No reported dysuria or urinary frequency or fevers. No antibiotics for now.  Hyperlipidemia - LDL 49, goal <70  - Continue statins.  Possible dementia - Possibly slightly confused and may be baseline.  DVT prophylaxis: Lovenox  Code Status: Full  Family Communication: None at bedside  Disposition Plan: to be determined   Consultants:  Neurology  Rehabilitation M.D.  Procedures:  None   Antimicrobials:  None     Subjective: Denies dizziness. Denies slurred speech or asymmetric limb weakness or numbness. As per RN, unsteady gait and fall risk.   Objective: Filed Vitals:   08/04/15 0100 08/04/15 0300 08/04/15 0500 08/04/15 1036  BP: 107/50 106/64 100/78 123/69  Pulse: 78 69 80 66  Temp: 98.6 F (37 C) 98.4 F (36.9 C) 98.1 F (36.7 C)   TempSrc: Oral Oral Oral Oral  Resp: Height:      Weight:      SpO2: 97% 99% 99% 100%   No intake or output data in the 24 hours ending 08/04/15 1229 Filed Weights   08/03/15 1125  Weight: 64.411 kg (142 lb)    Exam:  General exam: Moderately built and frail elderly female sitting comfortably in chair this morning.  Respiratory system: Clear. No increased work of breathing. Cardiovascular system: S1 & S2 heard, RRR. No JVD, murmurs, gallops, clicks or pedal edema.Telemetry: Sinus rhythm.  Gastrointestinal system: Abdomen is nondistended, soft and nontender. Normal bowel sounds heard. Central nervous system: Alert and oriented to person and place. No focal neurological deficits. Extremities: Symmetric 5 x 5 power.   Data Reviewed: Basic Metabolic Panel:  Recent Labs Lab 08/03/15 1406 08/03/15 1808 08/04/15 0746  NA 147*  --  144  K 4.5  --  4.0  CL 112*  --  105  CO2 25  --  27  GLUCOSE 78  --  98  BUN 14  --  12  CREATININE 1.25* 1.19* 1.18*  CALCIUM 10.0  --  10.0   Liver Function Tests: No results for input(s): AST, ALT, ALKPHOS, BILITOT, PROT, ALBUMIN in the last 168 hours. No results for input(s): LIPASE, AMYLASE in the  last 168 hours. No results for input(s): AMMONIA in the last 168 hours. CBC:  Recent Labs Lab 08/03/15 1202 08/03/15 1808 08/04/15 0746  WBC 6.6 5.7 4.8  HGB 12.9 11.2* 12.6  HCT 40.9 35.7* 40.2  MCV 93.4 91.5 91.6  PLT 188 191 197   Cardiac Enzymes: No results for input(s): CKTOTAL, CKMB, CKMBINDEX, TROPONINI in the last 168 hours. BNP (last 3 results) No results for input(s): PROBNP in  the last 8760 hours. CBG:  Recent Labs Lab 08/03/15 1131  GLUCAP 95    Recent Results (from the past 240 hour(s))  Urine culture     Status: None   Collection Time: 08/03/15  2:16 PM  Result Value Ref Range Status   Specimen Description URINE, CATHETERIZED  Final   Special Requests NONE  Final   Culture MULTIPLE SPECIES PRESENT, SUGGEST RECOLLECTION  Final   Report Status 08/04/2015 FINAL  Final         Studies: Ct Angio Head W/cm &/or Wo Cm  08/04/2015  CLINICAL DATA:  Stroke. EXAM: CT ANGIOGRAPHY HEAD AND NECK TECHNIQUE: Multidetector CT imaging of the head and neck was performed using the standard protocol during bolus administration of intravenous contrast. Multiplanar CT image reconstructions and MIPs were obtained to evaluate the vascular anatomy. Carotid stenosis measurements (when applicable) are obtained utilizing NASCET criteria, using the distal internal carotid diameter as the denominator. CONTRAST:  50 cc Omnipaque 350 intravenous COMPARISON:  Brain MRI from yesterday FINDINGS: CT HEAD Brain: Extensive chronic ischemic injury with numerous small vessel infarcts throughout the atrophic bilateral cerebellum. Chronic small cortical and white matter infarcts in the right frontal and parasagittal left occipital lobes. The acute right temporal cortex infarct is not visible by CT. No hydrocephalus or hemorrhage. Calvarium and skull base: Negative Paranasal sinuses: Clear where seen Orbits: Bilateral cataract resection.  No acute finding CTA NECK Aortic arch: No visualized aneurysm or dissection. Three vessel branching. Right carotid system: Minimal atheromatous change. No stenosis, dissection, or evidence of plaque ulceration. Left carotid system: Minimal atheromatous change. No stenosis, dissection, or plaque ulceration. Vertebral arteries:Codominant. No stenosis. No evidence of vasculopathy Skeleton: No contributory findings. Spondylosis and facet arthropathy arthropathy. Bulky  posterior endplate spurring and ossification at C5-6 with probable cord contact. Other neck: Multi nodular goiter. CTA HEAD Anterior circulation: No stenosis, beading, or major vessel occlusion. Negative for aneurysm. Posterior circulation: No stenosis, beading, or major vessel occlusion. Negative for aneurysm. Venous sinuses: Patent Anatomic variants: None significant. Intact and balanced circle-of-Willis. Delayed phase: No parenchymal enhancement IMPRESSION: 1. No acute arterial finding.  No flow limiting stenosis. 2. Minimal atherosclerosis for age and in comparison to the degree of advanced intracranial ischemic injury. Electronically Signed   By: Marnee Spring M.D.   On: 08/04/2015 06:54   Dg Chest 2 View  08/03/2015  CLINICAL DATA:  Dizziness EXAM: CHEST  2 VIEW COMPARISON:  03/30/2005 FINDINGS: The heart size and mediastinal contours are within normal limits. Both lungs are clear. The visualized skeletal structures are unremarkable. IMPRESSION: No active cardiopulmonary disease. Electronically Signed   By: Alcide Clever M.D.   On: 08/03/2015 20:29   Ct Angio Neck W/cm &/or Wo/cm  08/04/2015  CLINICAL DATA:  Stroke. EXAM: CT ANGIOGRAPHY HEAD AND NECK TECHNIQUE: Multidetector CT imaging of the head and neck was performed using the standard protocol during bolus administration of intravenous contrast. Multiplanar CT image reconstructions and MIPs were obtained to evaluate the vascular anatomy. Carotid stenosis measurements (when applicable) are obtained utilizing NASCET  criteria, using the distal internal carotid diameter as the denominator. CONTRAST:  50 cc Omnipaque 350 intravenous COMPARISON:  Brain MRI from yesterday FINDINGS: CT HEAD Brain: Extensive chronic ischemic injury with numerous small vessel infarcts throughout the atrophic bilateral cerebellum. Chronic small cortical and white matter infarcts in the right frontal and parasagittal left occipital lobes. The acute right temporal cortex infarct  is not visible by CT. No hydrocephalus or hemorrhage. Calvarium and skull base: Negative Paranasal sinuses: Clear where seen Orbits: Bilateral cataract resection.  No acute finding CTA NECK Aortic arch: No visualized aneurysm or dissection. Three vessel branching. Right carotid system: Minimal atheromatous change. No stenosis, dissection, or evidence of plaque ulceration. Left carotid system: Minimal atheromatous change. No stenosis, dissection, or plaque ulceration. Vertebral arteries:Codominant. No stenosis. No evidence of vasculopathy Skeleton: No contributory findings. Spondylosis and facet arthropathy arthropathy. Bulky posterior endplate spurring and ossification at C5-6 with probable cord contact. Other neck: Multi nodular goiter. CTA HEAD Anterior circulation: No stenosis, beading, or major vessel occlusion. Negative for aneurysm. Posterior circulation: No stenosis, beading, or major vessel occlusion. Negative for aneurysm. Venous sinuses: Patent Anatomic variants: None significant. Intact and balanced circle-of-Willis. Delayed phase: No parenchymal enhancement IMPRESSION: 1. No acute arterial finding.  No flow limiting stenosis. 2. Minimal atherosclerosis for age and in comparison to the degree of advanced intracranial ischemic injury. Electronically Signed   By: Marnee Spring M.D.   On: 08/04/2015 06:54   Mr Brain Wo Contrast  08/03/2015  CLINICAL DATA:  Increasing lightheadedness. History of hypertension, diabetes, and hyperlipidemia. Initial encounter. EXAM: MRI HEAD WITHOUT CONTRAST TECHNIQUE: Multiplanar, multiecho pulse sequences of the brain and surrounding structures were obtained without intravenous contrast. COMPARISON:  MR head 02/11/2007. FINDINGS: The patient was unable to remain motionless for the exam. Small or subtle lesions could be overlooked. There is a small focus of restricted diffusion along the RIGHT lateral/inferior margin of the temporal lobe. This is nonhemorrhagic. This  corresponds to RIGHT MCA vascular territory involvement. No other areas of restricted diffusion are seen. No acute hemorrhage, mass lesion, or extra-axial hematoma. Generalized atrophy. Hydrocephalus ex vacuo. Extensive chronic microvascular ischemic change. Widespread areas of chronic cerebral and cerebellar infarction, including RIGHT frontal, LEFT parieto-occipital, and BILATERAL cerebellar, without significant chronic hemorrhage. BILATERAL subdural hygromas/ prominence of the extracerebral CSF spaces due to atrophy. Flow voids are maintained. No midline abnormality. Extracranial soft tissues unremarkable. IMPRESSION: Small focus of acute infarction affecting the RIGHT lateral and inferior temporal lobe. This is nonhemorrhagic. Extensive atrophy and chronic microvascular ischemic change with numerous areas of chronic infarction. No evidence for large vessel occlusion. Electronically Signed   By: Elsie Stain M.D.   On: 08/03/2015 14:12        Scheduled Meds: . aspirin  324 mg Oral Daily  . enoxaparin (LOVENOX) injection  40 mg Subcutaneous Q24H  . irbesartan  150 mg Oral Daily  . rosuvastatin  5 mg Oral Daily   Continuous Infusions:   Principal Problem:   Acute CVA (cerebrovascular accident) (HCC) Active Problems:   Hyperlipidemia   Hypertension   CVA (cerebral infarction)   Diabetes (HCC)    Time spent: 40 minutes.    Marcellus Scott, MD, FACP, FHM. [facility] Pager [phone]  If 7PM-7AM, please contact night-coverage [email or web address] Password [facility] 08/04/2015, 12:29 PM    LOS: 1 day

## 2015-08-04 NOTE — Evaluation (Signed)
Physical Therapy Evaluation Patient Details Name: Sherri [name] MRN: [id number] DOB: 1940-08-10 Today's Date: 08/04/2015   History of Present Illness  [place] JEREE DELCID is an 75 y.o. female presenting with new onset dizziness with gait difficulty as well as slurred speech. PMHx- hypertension, diabetes mellitus and hyperlipidemia  MRI + small right temporal infarction. CT angiogram + Extensive chronic infarcts bilateral cerebellum, right frontal and parasagittal left occipital lobes.     Clinical Impression  Pt admitted with above diagnosis. Per husband, pt continues with cognitive, speaking, and balance difficulties compared to her baseline. Pt currently with functional limitations due to the deficits listed below (see PT Problem List).  Pt will benefit from skilled PT to increase their independence and safety with mobility to allow discharge to the venue listed below.       Follow Up Recommendations CIR    Equipment Recommendations  None recommended by PT    Recommendations for Other Services Rehab consult;OT consult;Speech consult     Precautions / Restrictions Precautions Precautions: Fall Precaution Comments: reports 1 fall in 12 mos; near fall in hospital      Mobility  Bed Mobility                  Transfers Overall transfer level: Needs assistance Equipment used: Rolling walker (2 wheeled);None Transfers: Sit to/from Stand Sit to Stand: Min assist;Min guard         General transfer comment: without device, pt braced back of legs against chair to steady herself and still required assist for balance due to anterior>posterior sway; + dizziness  Ambulation/Gait Ambulation/Gait assistance: Mod assist;Min assist Ambulation Distance (Feet): 120 Feet (x2) Assistive device: Rolling walker (2 wheeled);None Gait Pattern/deviations: Step-through pattern;Decreased stride length;Staggering right;Drifts right/left;Narrow base of support   Gait velocity interpretation:  Below normal speed for age/gender General Gait Details: without device, frequent imbalances noted and one significant stagger required assist to recover; with RW min assist for maneuvering  Stairs            Wheelchair Mobility    Modified Rankin (Stroke Patients Only) Modified Rankin (Stroke Patients Only) Pre-Morbid Rankin Score: No significant disability Modified Rankin: Moderately severe disability     Balance Overall balance assessment: Needs assistance;History of Falls Sitting-balance support: No upper extremity supported;Feet supported Sitting balance-Leahy Scale: Fair     Standing balance support: No upper extremity supported Standing balance-Leahy Scale: Poor Standing balance comment: anterior-posterior sway Single Leg Stance - Right Leg: 0 (large LOB) Single Leg Stance - Left Leg: 0     Rhomberg - Eyes Opened: 0     High Level Balance Comments: stand with feet shoulder width and eyes closed x 15 sec with minimal sway Standardized Balance Assessment Standardized Balance Assessment : Berg Balance Test Berg Balance Test Sit to Stand: Needs minimal aid to stand or to stabilize Standing Unsupported: Able to stand 30 seconds unsupported Sitting with Back Unsupported but Feet Supported on Floor or Stool: Able to sit 2 minutes under supervision Stand to Sit: Controls descent by using hands Transfers: Needs one person to assist Standing Unsupported with Eyes Closed: Able to stand 10 seconds with supervision Standing Ubsupported with Feet Together: Needs help to attain position and unable to hold for 15 seconds From Standing, Reach Forward with Outstretched Arm: Reaches forward but needs supervision From Standing Position, Turn to Look Behind Over each Shoulder: Turn sideways only but maintains balance Turn 360 Degrees: Needs close supervision or verbal cueing Standing on One Leg: Unable to  try or needs assist to prevent fall         Pertinent Vitals/Pain Pain  Assessment: No/denies pain    Home Living Family/patient expects to be discharged to:: Private residence Living Arrangements: Spouse/significant other Available Help at Discharge: Family;Available PRN/intermittently (husband very involved town Publishing rights manager, church, Programmer, multimedia) Type of Home: House Home Access: Stairs to enter Entrance Stairs-Rails: None Secretary/administrator of Steps: 2 Home Layout: One level Home Equipment: [employer] - single point      Prior Function Level of Independence: Independent         Comments: husband reports unsteady gait PTA (pt denies); used no device, denies furniture walking     Hand Dominance   Dominant Hand: Right    Extremity/Trunk Assessment   Upper Extremity Assessment: Defer to OT evaluation;Overall WFL for tasks assessed           Lower Extremity Assessment: Generalized weakness      Cervical / Trunk Assessment: Normal  Communication   Communication: No difficulties  Cognition Arousal/Alertness: Awake/alert Behavior During Therapy: WFL for tasks assessed/performed Overall Cognitive Status: Impaired/Different from baseline Area of Impairment: Memory;Following commands;Safety/judgement;Awareness;Problem solving     Memory: Decreased recall of precautions Following Commands: Follows multi-step commands with increased time (2 step) Safety/Judgement: Decreased awareness of safety;Decreased awareness of deficits Awareness: Intellectual Problem Solving: Slow processing;Requires verbal cues;Requires tactile cues General Comments: attempted to get OOB alone earlier with LOB and RN in to catch her     General Comments      Exercises        Assessment/Plan    PT Assessment Patient needs continued PT services  PT Diagnosis Difficulty walking   PT Problem List Decreased strength;Decreased balance;Decreased mobility;Decreased cognition;Decreased knowledge of use of DME;Decreased safety awareness;Decreased knowledge of precautions  PT  Treatment Interventions DME instruction;Gait training;Stair training;Functional mobility training;Therapeutic activities;Balance training;Cognitive remediation;Patient/family education   PT Goals (Current goals can be found in the Care Plan section) Acute Rehab PT Goals Patient Stated Goal: to not fall and break anything PT Goal Formulation: With patient/family Time For Goal Achievement: 08/11/15 Potential to Achieve Goals: Good    Frequency Min 4X/week   Barriers to discharge Decreased caregiver support husband involved in many organizations; may be able to arrange friends to assist when he is gone    Co-evaluation               End of Session Equipment Utilized During Treatment: Gait belt Activity Tolerance: Patient tolerated treatment well Patient left: in chair;with call bell/phone within reach;with chair alarm set;with family/visitor present Nurse Communication: Mobility status;Precautions (use RW)         Time: 1610-9604 PT Time Calculation (min) (ACUTE ONLY): 36 min   Charges:   PT Evaluation $PT Eval High Complexity: 1 Procedure PT Treatments $Gait Training: 8-22 mins   PT G Codes:        Nitisha Civello 08-08-2015, 10:42 AM Pager [phone]

## 2015-08-04 NOTE — Consult Note (Signed)
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 right handed female with history of hypertension, hyperlipidemia and diabetes mellitus and previous history of TIA. Patient lives with spouse independent with single-point cane prior to admission.She does not have 24/7 support at discharge. One level home two steps to entry. Present 08/03/2015 with dizziness, nausea without vomiting, slurred speech. MRI showed small focus of acute infarct right lateral and inferior temporal lobe. CT angiogram head and neck with No evidence for large vessel occlusion. Echocardiogram is pending. Patient did not receive TPA. Maintain on aspirin for CVA prophylaxis. Subcutaneous Lovenox for DVT prophylaxis. Regular diet consistency. Physical therapy evaluation completed 08/04/2015 with recommendations of physical medicine rehabilitation consult.  Review of Systems  Constitutional: Negative for fever and chills.  HENT: Negative for hearing loss.   Eyes: Positive for blurred vision. Negative for double vision.  Respiratory: Negative for cough and shortness of breath.   Cardiovascular: Negative for chest pain, palpitations and leg swelling.  Gastrointestinal: Positive for nausea and constipation. Negative for vomiting.  Genitourinary: Negative for dysuria and hematuria.  Musculoskeletal: Positive for myalgias.  Skin: Negative for rash.  Neurological: Positive for dizziness and speech change. Negative for seizures and headaches.  All other systems reviewed and are negative.  Past Medical History  Diagnosis Date  . Hyperlipidemia   . Hypertension   . Diabetes mellitus without complication (HCC)    History reviewed. No pertinent past surgical history. No family history on file. Social History:  reports that she has never smoked. She has never used smokeless tobacco. She  reports that she does not drink alcohol or use illicit drugs. Allergies:  Allergies  Allergen Reactions  . Hydrogen Peroxide Swelling  . Sulfa Antibiotics    Medications Prior to Admission  Medication Sig Dispense Refill  . amLODipine (NORVASC) 10 MG tablet TAKE 1 TABLET DAILY 90 tablet 1  . Glucosamine-Chondroit-Vit C-Mn (GLUCOSAMINE 1500 COMPLEX) CAPS Take 1 capsule by mouth daily.     . mirtazapine (REMERON) 15 MG tablet Take 1 tablet (15 mg total) by mouth at bedtime. 30 tablet 3  . Omega-3 Fatty Acids (FISH OIL) 1000 MG CAPS Take 1 capsule by mouth daily.      . rosuvastatin (CRESTOR) 5 MG tablet Take 1 tablet (5 mg total) by mouth daily. 90 tablet 3  . valsartan-hydrochlorothiazide (DIOVAN-HCT) 160-12.5 MG tablet Take 1 tablet by mouth daily. 90 tablet 3    Home: Home Living Family/patient expects to be discharged to:: Private residence Living Arrangements: Spouse/significant other Available Help at Discharge: Family, Available PRN/intermittently (husband very involved town Publishing rights manager, church, Programmer, multimedia) Type of Home: [employer] Home Access: Stairs to enter Secretary/administrator of Steps: 2 Entrance Stairs-Rails: None Home Layout: One level Firefighter: Standard Home Equipment: Medical laboratory scientific officer - single point  Functional History: Prior Function Level of Independence: Independent Comments: husband reports unsteady gait PTA (pt denies); used no device, denies furniture walking Functional Status:  Mobility:   Transfers Overall transfer level: Needs assistance Equipment used: Rolling walker (2 wheeled), None Transfers: Sit to/from Stand Sit to Stand: Min assist, Min guard General transfer comment: without device, pt braced back of legs against chair to steady herself and still required assist for balance due to anterior>posterior sway; + dizziness Ambulation/Gait Ambulation/Gait assistance: Mod assist, Min assist Ambulation Distance (Feet): 120 Feet (x2) Assistive device: Rolling walker  (2 wheeled), None Gait  Pattern/deviations: Step-through pattern, Decreased stride length, Staggering right, Drifts right/left, Narrow base of support General Gait Details: without device, frequent imbalances noted and one significant stagger required assist to recover; with RW min assist for maneuvering Gait velocity interpretation: Below normal speed for age/gender    ADL:    Cognition: Cognition Overall Cognitive Status: Impaired/Different from baseline Orientation Level: Oriented X4 Cognition Arousal/Alertness: Awake/alert Behavior During Therapy: WFL for tasks assessed/performed Overall Cognitive Status: Impaired/Different from baseline Area of Impairment: Memory, Following commands, Safety/judgement, Awareness, Problem solving Memory: Decreased recall of precautions Following Commands: Follows multi-step commands with increased time (2 step) Safety/Judgement: Decreased awareness of safety, Decreased awareness of deficits Awareness: Intellectual Problem Solving: Slow processing, Requires verbal cues, Requires tactile cues General Comments: attempted to get OOB alone earlier with LOB and RN in to catch her   Blood pressure 123/69, pulse 66, temperature 98.1 F (36.7 C), temperature source Oral, resp. rate 16, height  (1.676 m), weight 64.411 kg (142 lb), SpO2 100 %. Physical Exam  Vitals reviewed. Constitutional: She appears well-developed.  Frail  HENT:  Head: Normocephalic and atraumatic.  Eyes: Conjunctivae and EOM are normal.  Neck: Normal range of motion. Neck supple. No thyromegaly present.  Cardiovascular: Normal rate and regular rhythm.   Respiratory: Effort normal and breath sounds normal. No respiratory distress.  GI: Soft. Bowel sounds are normal. She exhibits no distension.  Musculoskeletal: She exhibits no edema or tenderness.  Neurological: She is alert.  Speech is mildly dysarthric but fully intelligible.  Good awareness of deficits.  Follows all  commands A&Ox2 Motor: 4+/5 throughout 3+ DTRs LUE/LLE  Skin: Skin is warm and dry.  Psychiatric: Thought content normal.  Mildly dysphoric   Results for orders placed or performed during the hospital encounter of 08/03/15 (from the past 24 hour(s))  Basic metabolic panel     Status: Abnormal   Collection Time: 08/03/15  2:06 PM  Result Value Ref Range   Sodium 147 (H) 135 - 145 mmol/L   Potassium 4.5 3.5 - 5.1 mmol/L   Chloride 112 (H) 101 - 111 mmol/L   CO2 25 22 - 32 mmol/L   Glucose, Bld 78 65 - 99 mg/dL   BUN 14 6 - 20 mg/dL   Creatinine, Ser 1.61 (H) 0.44 - 1.00 mg/dL   Calcium 09.6 8.9 - 04.5 mg/dL   GFR calc non Af Amer 41 (L) >60 mL/min   GFR calc Af Amer 48 (L) >60 mL/min   Anion gap 10 5 - 15  Urinalysis, Routine w reflex microscopic (not at [facility])     Status: Abnormal   Collection Time: 08/03/15  2:16 PM  Result Value Ref Range   Color, Urine YELLOW YELLOW   APPearance CLOUDY (A) CLEAR   Specific Gravity, Urine 1.012 1.005 - 1.030   pH 7.0 5.0 - 8.0   Glucose, UA NEGATIVE NEGATIVE mg/dL   Hgb urine dipstick NEGATIVE NEGATIVE   Bilirubin Urine NEGATIVE NEGATIVE   Ketones, ur NEGATIVE NEGATIVE mg/dL   Protein, ur NEGATIVE NEGATIVE mg/dL   Nitrite NEGATIVE NEGATIVE   Leukocytes, UA LARGE (A) NEGATIVE  Urine microscopic-add on     Status: Abnormal   Collection Time: 08/03/15  2:16 PM  Result Value Ref Range   Squamous Epithelial / LPF 6-30 (A) NONE SEEN   WBC, UA 6-30 0 - 5 WBC/hpf   RBC / HPF 0-5 0 - 5 RBC/hpf   Bacteria, UA FEW (A) NONE SEEN  Urine culture  Status: None   Collection Time: 08/03/15  2:16 PM  Result Value Ref Range   Specimen Description URINE, CATHETERIZED    Special Requests NONE    Culture MULTIPLE SPECIES PRESENT, SUGGEST RECOLLECTION    Report Status 08/04/2015 FINAL   Protime-INR     Status: None   Collection Time: 08/03/15  6:08 PM  Result Value Ref Range   Prothrombin Time 14.1 11.6 - 15.2 seconds   INR 1.07 0.00 - 1.49  CBC      Status: Abnormal   Collection Time: 08/03/15  6:08 PM  Result Value Ref Range   WBC 5.7 4.0 - 10.5 K/uL   RBC 3.90 3.87 - 5.11 MIL/uL   Hemoglobin 11.2 (L) 12.0 - 15.0 g/dL   HCT 16.1 (L) 09.6 - 04.5 %   MCV 91.5 78.0 - 100.0 fL   MCH 28.7 26.0 - 34.0 pg   MCHC 31.4 30.0 - 36.0 g/dL   RDW 40.9 81.1 - 91.4 %   Platelets 191 150 - 400 K/uL  Creatinine, serum     Status: Abnormal   Collection Time: 08/03/15  6:08 PM  Result Value Ref Range   Creatinine, Ser 1.19 (H) 0.44 - 1.00 mg/dL   GFR calc non Af Amer 44 (L) >60 mL/min   GFR calc Af Amer 50 (L) >60 mL/min  Lipid panel     Status: None   Collection Time: 08/04/15  3:44 AM  Result Value Ref Range   Cholesterol 104 0 - 200 mg/dL   Triglycerides 69 <782 mg/dL   HDL 41 >95 mg/dL   Total CHOL/HDL Ratio 2.5 RATIO   VLDL 14 0 - 40 mg/dL   LDL Cholesterol 49 0 - 99 mg/dL  Basic metabolic panel     Status: Abnormal   Collection Time: 08/04/15  7:46 AM  Result Value Ref Range   Sodium 144 135 - 145 mmol/L   Potassium 4.0 3.5 - 5.1 mmol/L   Chloride 105 101 - 111 mmol/L   CO2 27 22 - 32 mmol/L   Glucose, Bld 98 65 - 99 mg/dL   BUN 12 6 - 20 mg/dL   Creatinine, Ser 6.21 (H) 0.44 - 1.00 mg/dL   Calcium 30.8 8.9 - 65.7 mg/dL   GFR calc non Af Amer 44 (L) >60 mL/min   GFR calc Af Amer 51 (L) >60 mL/min   Anion gap 12 5 - 15  CBC     Status: None   Collection Time: 08/04/15  7:46 AM  Result Value Ref Range   WBC 4.8 4.0 - 10.5 K/uL   RBC 4.39 3.87 - 5.11 MIL/uL   Hemoglobin 12.6 12.0 - 15.0 g/dL   HCT 84.6 96.2 - 95.2 %   MCV 91.6 78.0 - 100.0 fL   MCH 28.7 26.0 - 34.0 pg   MCHC 31.3 30.0 - 36.0 g/dL   RDW 84.1 32.4 - 40.1 %   Platelets 197 150 - 400 K/uL   Ct Angio Head W/cm &/or Wo Cm  08/04/2015  CLINICAL DATA:  Stroke. EXAM: CT ANGIOGRAPHY HEAD AND NECK TECHNIQUE: Multidetector CT imaging of the head and neck was performed using the standard protocol during bolus administration of intravenous contrast. Multiplanar CT  image reconstructions and MIPs were obtained to evaluate the vascular anatomy. Carotid stenosis measurements (when applicable) are obtained utilizing NASCET criteria, using the distal internal carotid diameter as the denominator. CONTRAST:  50 cc Omnipaque 350 intravenous COMPARISON:  Brain MRI from yesterday FINDINGS: CT HEAD  Brain: Extensive chronic ischemic injury with numerous small vessel infarcts throughout the atrophic bilateral cerebellum. Chronic small cortical and white matter infarcts in the right frontal and parasagittal left occipital lobes. The acute right temporal cortex infarct is not visible by CT. No hydrocephalus or hemorrhage. Calvarium and skull base: Negative Paranasal sinuses: Clear where seen Orbits: Bilateral cataract resection.  No acute finding CTA NECK Aortic arch: No visualized aneurysm or dissection. Three vessel branching. Right carotid system: Minimal atheromatous change. No stenosis, dissection, or evidence of plaque ulceration. Left carotid system: Minimal atheromatous change. No stenosis, dissection, or plaque ulceration. Vertebral arteries:Codominant. No stenosis. No evidence of vasculopathy Skeleton: No contributory findings. Spondylosis and facet arthropathy arthropathy. Bulky posterior endplate spurring and ossification at C5-6 with probable cord contact. Other neck: Multi nodular goiter. CTA HEAD Anterior circulation: No stenosis, beading, or major vessel occlusion. Negative for aneurysm. Posterior circulation: No stenosis, beading, or major vessel occlusion. Negative for aneurysm. Venous sinuses: Patent Anatomic variants: None significant. Intact and balanced circle-of-Willis. Delayed phase: No parenchymal enhancement IMPRESSION: 1. No acute arterial finding.  No flow limiting stenosis. 2. Minimal atherosclerosis for age and in comparison to the degree of advanced intracranial ischemic injury. Electronically Signed   By: Marnee Spring M.D.   On: 08/04/2015 06:54   Dg  Chest 2 View  08/03/2015  CLINICAL DATA:  Dizziness EXAM: CHEST  2 VIEW COMPARISON:  03/30/2005 FINDINGS: The heart size and mediastinal contours are within normal limits. Both lungs are clear. The visualized skeletal structures are unremarkable. IMPRESSION: No active cardiopulmonary disease. Electronically Signed   By: Alcide Clever M.D.   On: 08/03/2015 20:29   Ct Angio Neck W/cm &/or Wo/cm  08/04/2015  CLINICAL DATA:  Stroke. EXAM: CT ANGIOGRAPHY HEAD AND NECK TECHNIQUE: Multidetector CT imaging of the head and neck was performed using the standard protocol during bolus administration of intravenous contrast. Multiplanar CT image reconstructions and MIPs were obtained to evaluate the vascular anatomy. Carotid stenosis measurements (when applicable) are obtained utilizing NASCET criteria, using the distal internal carotid diameter as the denominator. CONTRAST:  50 cc Omnipaque 350 intravenous COMPARISON:  Brain MRI from yesterday FINDINGS: CT HEAD Brain: Extensive chronic ischemic injury with numerous small vessel infarcts throughout the atrophic bilateral cerebellum. Chronic small cortical and white matter infarcts in the right frontal and parasagittal left occipital lobes. The acute right temporal cortex infarct is not visible by CT. No hydrocephalus or hemorrhage. Calvarium and skull base: Negative Paranasal sinuses: Clear where seen Orbits: Bilateral cataract resection.  No acute finding CTA NECK Aortic arch: No visualized aneurysm or dissection. Three vessel branching. Right carotid system: Minimal atheromatous change. No stenosis, dissection, or evidence of plaque ulceration. Left carotid system: Minimal atheromatous change. No stenosis, dissection, or plaque ulceration. Vertebral arteries:Codominant. No stenosis. No evidence of vasculopathy Skeleton: No contributory findings. Spondylosis and facet arthropathy arthropathy. Bulky posterior endplate spurring and ossification at C5-6 with probable cord  contact. Other neck: Multi nodular goiter. CTA HEAD Anterior circulation: No stenosis, beading, or major vessel occlusion. Negative for aneurysm. Posterior circulation: No stenosis, beading, or major vessel occlusion. Negative for aneurysm. Venous sinuses: Patent Anatomic variants: None significant. Intact and balanced circle-of-Willis. Delayed phase: No parenchymal enhancement IMPRESSION: 1. No acute arterial finding.  No flow limiting stenosis. 2. Minimal atherosclerosis for age and in comparison to the degree of advanced intracranial ischemic injury. Electronically Signed   By: Marnee Spring M.D.   On: 08/04/2015 06:54   Mr Brain Wo Contrast  08/03/2015  CLINICAL DATA:  Increasing lightheadedness. History of hypertension, diabetes, and hyperlipidemia. Initial encounter. EXAM: MRI HEAD WITHOUT CONTRAST TECHNIQUE: Multiplanar, multiecho pulse sequences of the brain and surrounding structures were obtained without intravenous contrast. COMPARISON:  MR head 02/11/2007. FINDINGS: The patient was unable to remain motionless for the exam. Small or subtle lesions could be overlooked. There is a small focus of restricted diffusion along the RIGHT lateral/inferior margin of the temporal lobe. This is nonhemorrhagic. This corresponds to RIGHT MCA vascular territory involvement. No other areas of restricted diffusion are seen. No acute hemorrhage, mass lesion, or extra-axial hematoma. Generalized atrophy. Hydrocephalus ex vacuo. Extensive chronic microvascular ischemic change. Widespread areas of chronic cerebral and cerebellar infarction, including RIGHT frontal, LEFT parieto-occipital, and BILATERAL cerebellar, without significant chronic hemorrhage. BILATERAL subdural hygromas/ prominence of the extracerebral CSF spaces due to atrophy. Flow voids are maintained. No midline abnormality. Extracranial soft tissues unremarkable. IMPRESSION: Small focus of acute infarction affecting the RIGHT lateral and inferior temporal  lobe. This is nonhemorrhagic. Extensive atrophy and chronic microvascular ischemic change with numerous areas of chronic infarction. No evidence for large vessel occlusion. Electronically Signed   By: Elsie Stain M.D.   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  Records reviewed and summated above. Stroke: Continue secondary stroke prophylaxis and Risk Factor Modification listed below:   Antiplatelet therapy:   Blood Pressure Management:  Continue current medication with prn's with permisive HTN per primary team Statin Agent:    1. Does the need for close, 24 hr/day medical supervision in concert with the patient's rehab needs make it unreasonable for this patient to be served in a less intensive setting? Potentially  2. Co-Morbidities requiring supervision/potential complications: HTN (monitor and provide prns in accordance with increased physical exertion and pain), hyperlipidemia (Cont meds), diabetes mellitus (Monitor in accordance with exercise and adjust meds as necessary), previous history of TIA, CKD (avoid nephrotoxic meds) 3. Due to safety, disease management and patient education, does the patient require 24 hr/day rehab nursing? Potentially 4. Does the patient require coordinated care of a physician, rehab nurse, PT (1-2 hrs/day, 5 days/week), OT (1-2 hrs/day, 5 days/week) and SLP (1-2 hrs/day, 5 days/week) to address physical and functional deficits in the context of the above medical diagnosis(es)? Yes Addressing deficits in the following areas: balance, endurance, locomotion, strength, transferring, toileting, cognition and psychosocial support 5. Can the patient actively participate in an intensive therapy program of at least 3 hrs of therapy per day at least 5 days per week? Yes 6. The potential for patient to make measurable gains while on inpatient rehab is good and  fair 7. Anticipated functional outcomes upon discharge from inpatient rehab are supervision and min assist  with PT, supervision and min assist with OT, supervision and min assist with SLP. 8. Estimated rehab length of stay to reach the above functional goals is: 10-13 days. 9. Does the patient have adequate social supports and living environment to accommodate these discharge functional goals? Potentially 10. Anticipated D/C setting: Home 11. Anticipated post D/C treatments: HH therapy and Home excercise program 12. Overall Rehab/Functional Prognosis: good  RECOMMENDATIONS: This patient's condition is appropriate for continued rehabilitative care in the following setting: Unfortunately, pt does not have a medical need to justify admissiont to IRF.  Further, pt's support at discharge is not clear.  If pt does not have adequate support at discharge, would recommend SNF. Echocardiogram is pending  Patient  has agreed to participate in recommended program. Potentially Note that insurance prior authorization may be required for reimbursement for recommended care.  Comment: Rehab Admissions Coordinator to follow up.  Maryla Morrow, MD 08/04/2015

## 2015-08-04 NOTE — Progress Notes (Signed)
VASCULAR LAB PRELIMINARY  PRELIMINARY  PRELIMINARY  PRELIMINARY  Bilateral lower extremity venous duplex  completed.    Preliminary report:  Bilateral:  No evidence of DVT, superficial thrombosis, or Baker's Cyst.    Colleen Donahoe, RVT 08/04/2015, 2:30 PM

## 2015-08-05 LAB — GLUCOSE, CAPILLARY
GLUCOSE-CAPILLARY: 88 mg/dL (ref 65–99)
GLUCOSE-CAPILLARY: 90 mg/dL (ref 65–99)
Glucose-Capillary: 80 mg/dL (ref 65–99)
Glucose-Capillary: 85 mg/dL (ref 65–99)

## 2015-08-05 LAB — HEMOGLOBIN A1C
HEMOGLOBIN A1C: 5.7 % — AB (ref 4.8–5.6)
Mean Plasma Glucose: 117 mg/dL

## 2015-08-05 NOTE — Evaluation (Signed)
Occupational Therapy Evaluation Patient Details Name: Sherri [name] MRN: [id number] DOB: Oct 22, 1940 Today's Date: 08/05/2015    History of Present Illness Sherri [name] is an 75 y.o. female presenting with new onset dizziness with gait difficulty as well as slurred speech. PMHx- hypertension, diabetes mellitus and hyperlipidemia  MRI + small right temporal infarction. CT angiogram + Extensive chronic infarcts bilateral cerebellum, right frontal and parasagittal left occipital lobes.    Clinical Impression   Pt admitted with the above diagnosis and has the deficits listed below. Pt would benefit from cont OT to increase independence in all adls and balance with adls in standing so she can d/c home with husband who is not always there.  Feel pt would benefit from post acute rehab to attempt to reach this mod I level of care so she will not need 24 hour S.  If pt is d/c'd straight home, feel she will need 24 hour support and is a fall risk.  If pt not approved for CIR and has 24 hour assist, rec HHOT.    Follow Up Recommendations  Supervision/Assistance - 24 hour;CIR    Equipment Recommendations  3 in 1 bedside comode;Tub/shower seat    Recommendations for Other Services       Precautions / Restrictions Precautions Precautions: Fall Precaution Comments: reports 1 fall in 12 mos; near fall in hospital Restrictions Weight Bearing Restrictions: No      Mobility Bed Mobility Overal bed mobility: Needs Assistance Bed Mobility: Supine to Sit     Supine to sit: Supervision     General bed mobility comments: not much physical assist needed.  Encouragement to move on her own necessary  Transfers Overall transfer level: Needs assistance Equipment used: Rolling walker (2 wheeled);None Transfers: Sit to/from Stand Sit to Stand: Min guard         General transfer comment: Pt with a posterior lean when first getting up.  Weight was on her heels and pt needed assist to shift if  forward.    Balance Overall balance assessment: Needs assistance Sitting-balance support: Feet supported Sitting balance-Leahy Scale: Fair     Standing balance support: During functional activity Standing balance-Leahy Scale: Poor Standing balance comment: pt with posterior lean.                            ADL Overall ADL's : Needs assistance/impaired Eating/Feeding: Supervision/ safety;Sitting Eating/Feeding Details (indicate cue type and reason): supervision for swallowing and ensuring pt is sitting up at 90 degrees. Grooming: Wash/dry hands;Wash/dry face;Oral care;Min guard;Cueing for sequencing;Standing Grooming Details (indicate cue type and reason): pt at first did not use any water to brush teeth but eventally did. Upper Body Bathing: Minimal assitance;Cueing for sequencing;Sitting Upper Body Bathing Details (indicate cue type and reason): cues to keep going and to be thorough.  Pt would start in one area and perseverate there until cued to move on. Lower Body Bathing: Minimal assistance;Cueing for sequencing;Sit to/from stand Lower Body Bathing Details (indicate cue type and reason): cues to move on with the task. Min assist needed in standing for balance when reaching for back side. Upper Body Dressing : Set up;Sitting   Lower Body Dressing: Minimal assistance;Sit to/from stand Lower Body Dressing Details (indicate cue type and reason): Fewer cues needed to stay on task.  pt could donn and doff socks without assist.  Min assist given when in standing for decreased balance. Toilet Transfer: Minimal assistance;Cueing for safety;Ambulation;Comfort height toilet;Grab  bars Toilet Transfer Details (indicate cue type and reason): cues for safety and balance. Toileting- Architect and Hygiene: Min guard;Sit to/from stand Toileting - Clothing Manipulation Details (indicate cue type and reason): min guard in standing. Pt with posterior lean at times with weight on  heels.     Functional mobility during ADLs: Minimal assistance General ADL Comments: Pt did well with adls in sitting from functional standpoint.  Pt did need cues to continue tasks due to distraction and poor attn. span.  Pt needed increased assist when standing due to poor standing balance.     Vision Vision Assessment?: Vision impaired- to be further tested in functional context Additional Comments: Need to further evaluate vision.   Perception Perception Perception Tested?: Yes   Praxis Praxis Praxis tested?: Within functional limits    Pertinent Vitals/Pain Pain Assessment: No/denies pain     Hand Dominance Right   Extremity/Trunk Assessment Upper Extremity Assessment Upper Extremity Assessment: Overall WFL for tasks assessed   Lower Extremity Assessment Lower Extremity Assessment: Defer to PT evaluation   Cervical / Trunk Assessment Cervical / Trunk Assessment: Normal   Communication Communication Communication: No difficulties   Cognition Arousal/Alertness: Awake/alert Behavior During Therapy: WFL for tasks assessed/performed Overall Cognitive Status: Impaired/Different from baseline Area of Impairment: Memory;Following commands;Safety/judgement;Awareness;Problem solving     Memory: Decreased recall of precautions;Decreased short-term memory Following Commands: Follows multi-step commands with increased time Safety/Judgement: Decreased awareness of safety;Decreased awareness of deficits Awareness: Intellectual Problem Solving: Slow processing;Requires verbal cues;Requires tactile cues General Comments: Pt has had near falls in hospital despite cues to ask for assist.   General Comments       Exercises       Shoulder Instructions      Home Living Family/patient expects to be discharged to:: Private residence Living Arrangements: Spouse/significant other Available Help at Discharge: Family;Available PRN/intermittently Type of Home: House Home Access:  Stairs to enter [employer] of Steps: 2 Entrance Stairs-Rails: None Home Layout: One level     Bathroom Shower/Tub: Tub/shower unit;Curtain Shower/tub characteristics: Engineer, building services: Standard     Home Equipment: Cane - single point          Prior Functioning/Environment Level of Independence: Independent        Comments: pt does not use assistive device.  pt gave up driving last year.    OT Diagnosis: Generalized weakness;Cognitive deficits   OT Problem List: Decreased strength;Decreased cognition;Decreased safety awareness;Decreased knowledge of use of DME or AE   OT Treatment/Interventions: Self-care/ADL training;DME and/or AE instruction;Therapeutic activities    OT Goals(Current goals can be found in the care plan section) Acute Rehab OT Goals Patient Stated Goal: to not fall and break anything OT Goal Formulation: With patient Time For Goal Achievement: 08/19/15 Potential to Achieve Goals: Fair ADL Goals Pt Will Perform Lower Body Bathing: with supervision;sit to/from stand Pt Will Perform Lower Body Dressing: with supervision;sit to/from stand Pt Will Perform Tub/Shower Transfer: Tub transfer;shower seat;with min guard assist;rolling walker Additional ADL Goal #1: Pt will walk to bathroom and toilet on 3:1 over commode with min guard.  OT Frequency: Min 2X/week   Barriers to D/C: Decreased caregiver support  need to make sure pt has strict 24 hour S.  Feel pt is a fall risk.       Co-evaluation              End of Session Equipment Utilized During Treatment: Engineer, water Communication: Mobility status  Activity Tolerance: Patient tolerated treatment well  Patient left: in chair;with call bell/phone within reach;with chair alarm set   Time: 1245-1300 OT Time Calculation (min): 15 min Charges:  OT General Charges $OT Visit: 1 Procedure OT Evaluation $OT Eval Moderate Complexity: 1 Procedure G-Codes:    Hope Budds August 28, 2015, 1:13 PM  [phone]

## 2015-08-05 NOTE — Progress Notes (Signed)
STROKE TEAM PROGRESS NOTE   SUBJECTIVE (INTERVAL HISTORY) Patient up in the chair at the bedside. Lives with her husband. States he just left 15-20 mins ago. More clear than yesterday.   OBJECTIVE Temp:  [97.4 F (36.3 C)-98.5 F (36.9 C)] 97.4 F (36.3 C) (02/28 0934) Pulse Rate:  [66-81] 81 (02/28 0934) Cardiac Rhythm:  [-] Normal sinus rhythm (02/28 0700) Resp:  [16-18] 16 (02/28 0934) BP: (117-139)/(67-79) 117/67 mmHg (02/28 0934) SpO2:  [96 %-100 %] 97 % (02/28 0934)  CBC:   Recent Labs Lab 08/03/15 1808 08/04/15 0746  WBC 5.7 4.8  HGB 11.2* 12.6  HCT 35.7* 40.2  MCV 91.5 91.6  PLT 191 197   Basic Metabolic Panel:   Recent Labs Lab 08/03/15 1406 08/03/15 1808 08/04/15 0746  NA 147*  --  144  K 4.5  --  4.0  CL 112*  --  105  CO2 25  --  27  GLUCOSE 78  --  98  BUN 14  --  12  CREATININE 1.25* 1.19* 1.18*  CALCIUM 10.0  --  10.0    Lipid Panel:     Component Value Date/Time   CHOL 104 08/04/2015 0344   TRIG 69 08/04/2015 0344   HDL 41 08/04/2015 0344   CHOLHDL 2.5 08/04/2015 0344   VLDL 14 08/04/2015 0344   LDLCALC 49 08/04/2015 0344   HgbA1c:  Lab Results  Component Value Date   HGBA1C 5.7* 08/04/2015   Urine Drug Screen:     Component Value Date/Time   LABOPIA NONE DETECTED 10/13/2010 1647   COCAINSCRNUR NONE DETECTED 10/13/2010 1647   LABBENZ NONE DETECTED 10/13/2010 1647   AMPHETMU NONE DETECTED 10/13/2010 1647   THCU NONE DETECTED 10/13/2010 1647   LABBARB  10/13/2010 1647    NONE DETECTED        DRUG SCREEN FOR MEDICAL PURPOSES ONLY.  IF CONFIRMATION IS NEEDED FOR ANY PURPOSE, NOTIFY LAB WITHIN 5 DAYS.        LOWEST DETECTABLE LIMITS FOR URINE DRUG SCREEN Drug Class       Cutoff (ng/mL) Amphetamine      1000 Barbiturate      200 Benzodiazepine   200 Tricyclics       300 Opiates          300 Cocaine          300 THC              50      IMAGING I have personally reviewed the radiological images below and agree with the  radiology interpretations.  Dg Chest 2 View 08/03/2015  No active cardiopulmonary disease.   Ct Angio Head & Neck W/cm &/or Wo/cm 08/04/2015   1. No acute arterial finding.  No flow limiting stenosis. 2. Minimal atherosclerosis for age and in comparison to the degree of advanced intracranial ischemic injury.   Mr Brain Wo Contrast 08/03/2015  Small focus of acute infarction affecting the RIGHT lateral and inferior temporal lobe. This is nonhemorrhagic. Extensive atrophy and chronic microvascular ischemic change with numerous areas of chronic infarction. No evidence for large vessel occlusion.    LE venous doppler - negative  TTE  - Left ventricle: The cavity size was normal. Wall thickness wasnormal. Systolic function was normal. The estimated ejectionfraction was in the range of 55% to 60%. Wall motion was normal;there were no regional wall motion abnormalities. Leftventricular diastolic function parameters were normal. - Aortic valve: There was trivial regurgitation. - Mitral valve: There  was mild to moderate regurgitation.  CTA head and neck 1. No acute arterial finding. No flow limiting stenosis. 2. Minimal atherosclerosis for age and in comparison to the degree of advanced intracranial ischemic injury.  TEE - pending   PHYSICAL EXAM Temp:  [97.4 F (36.3 C)-98.5 F (36.9 C)] 97.4 F (36.3 C) (02/28 0934) Pulse Rate:  [66-81] 81 (02/28 0934) Resp:  [16-18] 16 (02/28 0934) BP: (117-139)/(67-79) 117/67 mmHg (02/28 0934) SpO2:  [96 %-100 %] 97 % (02/28 0934)  General - Well nourished, well developed, in no apparent distress.  Ophthalmologic - Fundi not visualized due to noncooperation.  Cardiovascular - Regular rate and rhythm with no murmur.  Mental Status -  Level of arousal and orientation to time, place, and person were intact. Language including expression, naming, repetition, comprehension was assessed and found intact. Fund of Knowledge was assessed and was  intact.  Cranial Nerves II - XII - II - Visual field intact OU, lower part visual field decreased visual acuity. III, IV, VI - Extraocular movements intact V - Facial sensation intact bilaterally. VII - Facial movement intact bilaterally. VIII - Hearing & vestibular intact bilaterally. X - Palate elevates symmetrically, mild dysarthria. XI - Chin turning & shoulder shrug intact bilaterally. XII - Tongue protrusion intact.  Motor Strength - The patient's strength was normal in all extremities and pronator drift was absent.  Bulk was normal and fasciculations were absent.   Motor Tone - Muscle tone was assessed at the neck and appendages and was normal.  Reflexes - The patient's reflexes were 1+ in all extremities and she had no pathological reflexes.  Sensory - Light touch, temperature/pinprick, vibration and proprioception, and Romberg testing were assessed and were symmetrical.    Coordination - The patient had normal movements in the hands and feet with no ataxia or dysmetria.  Tremor was absent.  Gait and Station - deferred to PT due to safety concerns   ASSESSMENT/PLAN Sherri [name] is a 75 y.o. female with history of hypertension, diabetes, hyperlipidemia and TIA presenting with slurred speech, gait difficulty and dizziness. She did not receive IV t-PA due to delay in arrival.   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 Arranged with [facility] for Wed 3/1. (I have made patient NPO after midnight tonight). If TEE negative, a [facility] electrophysiologist will consult and consider placement of  an implantable loop recorder to evaluate for atrial fibrillation as etiology of stroke. This has been explained to patient/family by Dr. Roda Shutters and she is agreeable.   LDL 49  HgbA1c 5.7  Lovenox 40 mg sq daily for VTE prophylaxis Diet heart healthy/carb modified Room service appropriate?: Yes; Fluid consistency:: Thin Diet NPO time specified  No antithrombotic prior to admission, now on aspirin 325 mg daily.   Patient counseled to be compliant with her antithrombotic medications  Ongoing aggressive stroke risk factor management  Therapy recommendations:  CIR. Consult reports pt without medical need for CIR. 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 After hours, contact General Neurology

## 2015-08-05 NOTE — Progress Notes (Signed)
I contacted pt's spouse by phone to discuss rehab venue options and caregiver support at home. We plan to meet tomorrow at 10 am to further discuss after he speaks with his daughter who is a physician in [place] and pt's brother from [place] who is arriving today. [phone]

## 2015-08-05 NOTE — Progress Notes (Signed)
Physical Therapy Treatment Patient Details Name: Sherri [name] MRN: [id number] DOB: 1940-07-04 Today's Date: 08/05/2015    History of Present Illness Sherri [name] is an 75 y.o. female presenting with new onset dizziness with gait difficulty as well as slurred speech. PMHx- hypertension, diabetes mellitus and hyperlipidemia  MRI + small right temporal infarction. CT angiogram + Extensive chronic infarcts bilateral cerebellum, right frontal and parasagittal left occipital lobes.     PT Comments    Patient improved, however continued to need min assist for losses of balance. Pt would benefit from further PT to increase independence with gait and balance. Hopeful after therapy patient could d/c home with husband who is not always there (leaves for some hours during the day). Feel pt would benefit from post acute rehab to attempt to reach this modified Independent level of care so she will not need 24 hour supervision. If pt is d/c'd straight home, feel she will need 24 hour support and is a fall risk. If pt not approved for CIR, recommend home with HHPT with 24/7 supervision. If pt does not have 24/7 support via family and friends, would recommend SNF for further therapies.   Follow Up Recommendations  CIR     Equipment Recommendations  None recommended by PT    Recommendations for Other Services       Precautions / Restrictions Precautions Precautions: Fall Precaution Comments: reports 1 fall in 12 mos; near fall in hospital    Mobility  Bed Mobility                  Transfers Overall transfer level: Needs assistance Equipment used: Rolling walker (2 wheeled);None Transfers: Sit to/from Stand Sit to Stand: Min assist         General transfer comment: continued posterior bias as comes to stand (although did not use back of legs against chair to brace herself); assist for anterior weight shift  Ambulation/Gait Ambulation/Gait assistance: Min assist Ambulation  Distance (Feet): 150 Feet Assistive device: Rolling walker (2 wheeled) Gait Pattern/deviations: Step-through pattern;Decreased stride length;Drifts right/left;Trunk flexed   Gait velocity interpretation: Below normal speed for age/gender General Gait Details: patient did not recall that she was to use RW when up walking; required initially max cues (progressed to questioning cues and non-verbal/suggestive cues) to use RW properly (closer proximity, especially with turns)   Information systems manager Rankin (Stroke Patients Only) Modified Rankin (Stroke Patients Only) Pre-Morbid Rankin Score: No significant disability Modified Rankin: Moderately severe disability     Balance Overall balance assessment: Needs assistance         Standing balance support: No upper extremity supported Standing balance-Leahy Scale: Poor Standing balance comment: incr anterior-posterior sway in static standing         Rhomberg - Eyes Opened: 10 (close guarding) Rhomberg - Eyes Closed: 5 (opened eyes as she began to feel off balance) High level balance activites: Backward walking;Direction changes;Turns;Head turns High Level Balance Comments: drifts rt and lt when walking and performing head turns; 360 degree turn required 22 steps!! (pt VERY slow and guarded, although no LOB)    Cognition Arousal/Alertness: Awake/alert Behavior During Therapy: WFL for tasks assessed/performed Overall Cognitive Status: No family/caregiver present to determine baseline cognitive functioning Area of Impairment: Memory;Safety/judgement;Awareness;Problem solving     Memory: Decreased recall of precautions Following Commands: Follows multi-step commands with increased time (2 step) Safety/Judgement: Decreased awareness of safety;Decreased awareness of  deficits Awareness: Intellectual Problem Solving: Slow processing;Requires verbal cues;Requires tactile cues General Comments: States she  feels she is safe to walk by herself, however agrees to call for assistance    Exercises General Exercises - Lower Extremity Hip Flexion/Marching: AAROM;Both;10 reps;Standing (no UE support; close guarding with assist for minor LOB)    General Comments        Pertinent Vitals/Pain Pain Assessment: No/denies pain    Home Living                      Prior Function            PT Goals (current goals can now be found in the care plan section) Acute Rehab PT Goals Patient Stated Goal: to not fall and break anything Time For Goal Achievement: 08/11/15 Progress towards PT goals: Progressing toward goals    Frequency  Min 4X/week    PT Plan Current plan remains appropriate    Co-evaluation             End of Session Equipment Utilized During Treatment: Gait belt Activity Tolerance: Patient tolerated treatment well Patient left: in chair;with call bell/phone within reach;with chair alarm set     Time: 9604-5409 PT Time Calculation (min) (ACUTE ONLY): 20 min  Charges:  $Gait Training: 8-22 mins                    G Codes:      Sherri [name] 2015/08/09, 4:16 PM Pager [phone]

## 2015-08-05 NOTE — Evaluation (Addendum)
Speech Language Pathology Evaluation Patient Details Name: Sherri [name] MRN: [id number] DOB: [birth year] Today's Date: 08/05/2015 Time: 4098-1191 SLP Time Calculation (min) (ACUTE ONLY): 19 min  Problem List:  Patient Active Problem List   Diagnosis Date Noted  . History of TIA (transient ischemic attack)   . CKD (chronic kidney disease)   . Acute CVA (cerebrovascular accident) (HCC) 08/03/2015  . CVA (cerebral infarction) 08/03/2015  . Diabetes (HCC) 08/03/2015  . Dizziness   . Encounter for medication counseling 07/03/2015  . Medicare annual wellness visit, subsequent 03/24/2015  . Stress at home 09/16/2014  . Adjustment disorder with mixed anxiety and depressed mood 09/16/2014  . Loss of weight 08/20/2014  . Hyperlipidemia   . Hypertension    Past Medical History:  Past Medical History  Diagnosis Date  . Hyperlipidemia   . Hypertension   . Diabetes mellitus without complication [facility])    Past Surgical History: History reviewed. No pertinent past surgical history. HPI:  Sherri [name] is an 75 y.o. female presenting with new onset dizziness with gait difficulty as well as slurred speech. PMHx- hypertension, diabetes mellitus and hyperlipidemia MRI + small right temporal infarction. CT angiogram + Extensive chronic infarcts bilateral cerebellum, right frontal and parasagittal left occipital lobes.    Assessment / Plan / Recommendation Clinical Impression  Cognitive-linguistic evaluation complete.  Speech has low vocal intensity and appeared labored at times but was overall intelligible and functional.  Patient demonstrates skills consistent with a score of 13/30 on the [facility] Cognitive Assessment, 26 or greater are judged to be WNL.  While baseline information is not available from spouse, patient reports that she managed her own finances and medications PTA.  Impairments in inhibition, storage and retreival of new information, orientation to time and date as well as basic  calculations result in the need for skilled SLP services in this level of care as well as the next level of care.  Agree with PT recommendations; if patient returns home at this point she will require 24/7 supervision and if she goes to inpatient rehab she will likely achieve an intermittent supervision level with cognitive tasks.       SLP Assessment  Patient needs continued Speech Lanaguage Pathology Services    Follow Up Recommendations  Inpatient Rehab    Frequency and Duration min 2x/week  1 week      SLP Evaluation Prior Functioning  Cognitive/Linguistic Baseline: Information not available (pt reports that she did not drive PTA) Type of Home: House  Lives With: Spouse Available Help at Discharge: Family;Available PRN/intermittently   Cognition  Overall Cognitive Status: No family/caregiver present to determine baseline cognitive functioning Arousal/Alertness: Awake/alert Orientation Level: Oriented to person;Oriented to place;Oriented to situation;Disoriented to time Attention: Sustained Sustained Attention: Appears intact Memory: Impaired Memory Impairment: Storage deficit;Retrieval deficit;Decreased recall of new information Awareness: Impaired Awareness Impairment: Intellectual impairment Problem Solving: Impaired Problem Solving Impairment: Functional basic Executive Function: Decision Making;Self Monitoring;Self Correcting Decision Making: Impaired Decision Making Impairment: Functional basic Self Monitoring: Impaired Self Monitoring Impairment: Functional basic Self Correcting: Impaired Self Correcting Impairment: Functional basic Safety/Judgment: Impaired Comments: patient with decreased inhibition during structured tasks and laughter throughout eval.  Patient acknowledged her thinking is slower now at end of eval.     Comprehension  Auditory Comprehension Overall Auditory Comprehension: Appears within functional limits for tasks assessed Visual  Recognition/Discrimination Discrimination: Not tested Reading Comprehension Reading Status: Not tested    Expression Expression Primary Mode of Expression: Verbal Verbal Expression Overall Verbal Expression: Appears  within functional limits for tasks assessed Other Verbal Expression Comments: slow labored speech with low volume but intelligible  Written Expression Dominant Hand: Right   Oral / Motor  Oral Motor/Sensory Function Overall Oral Motor/Sensory Function: Mild impairment Motor Speech Overall Motor Speech: Impaired Respiration: Within functional limits Phonation: Low vocal intensity Resonance: Within functional limits Articulation: Within functional limitis Intelligibility: Intelligible Motor Planning: Witnin functional limits Motor Speech Errors: Not applicable Effective Techniques: Increased vocal intensity   GO                   Charlane Ferretti., CCC-SLP [phone]  Hartley Urton 08/05/2015, 4:59 PM

## 2015-08-05 NOTE — Progress Notes (Signed)
PROGRESS NOTE    Sherri [name]  UJW:[id number]  DOB: 05-11-1941  DOA: 08/03/2015 PCP: Ruthe Mannan, MD Outpatient Specialists:   Hospital course: 74 year old female with history of DM, HTN, HLD, prior strokes without significant deficit, possible dementia, presented to [facility] ED on 08/03/15 with complaints of dizziness, gait instability and slurred speech which were present when she woke up on 08/03/15 morning. MRI brain showed a small right inferior and posterior temporal ischemic infarction, multiple old strokes. Neurology consulted and admitted for stroke evaluation.   Assessment & Plan:   Acute CVA - Resultant dizziness, gait instability and slurred speech. Speech normalized. Instability and fall risk persists. - MRI brain: Small focus of acute infarction affecting the right lateral and inferior temporal lobe. Numerous areas of chronic infarction. As per neurology, right acute CVA territory infarcts, embolic secondary to unknown source. -  CTA head and neck: No acute arterial finding. No flow-limiting stenosis. - 2-D echo: Unremarkable. Results as below. - Lower extremity venous Dopplers: Negative for DVT. As per neurology, TEE to look for embolic source and arranged for 08/06/15.  - LDL: 49 - Hemoglobin A1c; 5.7. - Noncompliant with aspirin prior to admission. Now on aspirin 325 MG daily for secondary stroke prophylaxis. - PT recommends CIR consul. Rehabilitation declined and recommends SNF. - Neurology consultation appreciated.  Essential hypertension - Allow for permissive hypertension. Hold irbesartan and Diovan for now.  Type II DM - Hemoglobin A1: 5.7. - Patient apparently noncompliant with home oral medications. - SSI.  Pyuria - Urine microscopy shows few bacteria, large leukocytes but negative nitrites. No reported dysuria or urinary frequency or fevers. No antibiotics for now.  Hyperlipidemia - LDL 49, goal <70  - Continue statins.  Possible dementia - Possibly  slightly confused and may be baseline.  DVT prophylaxis: Lovenox  Code Status: Full  Family Communication: None at bedside  Disposition Plan: to be determined . SNF versus home with spouse and therapies. Possible discharge 3/1 pending completion of stroke workup   Consultants:  Neurology  Rehabilitation M.D.  Procedures:  2-D echo 08/04/15: Study Conclusions  - Left ventricle: The cavity size was normal. Wall thickness was normal. Systolic function was normal. The estimated ejection fraction was in the range of 55% to 60%. Wall motion was normal; there were no regional wall motion abnormalities. Left ventricular diastolic function parameters were normal. - Aortic valve: There was trivial regurgitation. - Mitral valve: There was mild to moderate regurgitation.  Bilateral lower extremity venous Dopplers 08/04/15: Summary:  - No evidence of deep vein thrombosis involving the right lower  extremity. - No evidence of deep vein thrombosis involving the left lower  extremity.  Antimicrobials:  None   Subjective:  slightly confused. Wants to know when she can go home. As per RN, no acute issues.   Objective: Filed Vitals:   08/04/15 2207 08/05/15 0153 08/05/15 0600 08/05/15 0934  BP: 121/79 121/67 130/71 117/67  Pulse: 68 66 75 81  Temp: 98.5 F (36.9 C) 98.4 F (36.9 C) 97.9 F (36.6 C) 97.4 F (36.3 C)  TempSrc: Oral Oral Oral Oral  Resp: 18 18 18 16   Height:      Weight:      SpO2: 97% 96% 97% 97%    Intake/Output Summary (Last 24 hours) at 08/05/15 1209 Last data filed at 08/05/15 0934  Gross per 24 hour  Intake    241 ml  Output      0 ml  Net    241  ml   Filed Weights   08/03/15 1125  Weight: 64.411 kg (142 lb)    Exam:  General exam: Moderately built and frail elderly female sitting comfortably in chair this morning.  Respiratory system: Clear. No increased work of breathing. Cardiovascular system: S1 & S2 heard, RRR. No JVD, murmurs,  gallops, clicks or pedal edema.Telemetry: Sinus rhythm.  Gastrointestinal system: Abdomen is nondistended, soft and nontender. Normal bowel sounds heard. Central nervous system: Alert and oriented to person and place. No focal neurological deficits. Extremities: Symmetric 5 x 5 power.   Data Reviewed: Basic Metabolic Panel:  Recent Labs Lab 08/03/15 1406 08/03/15 1808 08/04/15 0746  NA 147*  --  144  K 4.5  --  4.0  CL 112*  --  105  CO2 25  --  27  GLUCOSE 78  --  98  BUN 14  --  12  CREATININE 1.25* 1.19* 1.18*  CALCIUM 10.0  --  10.0   Liver Function Tests: No results for input(s): AST, ALT, ALKPHOS, BILITOT, PROT, ALBUMIN in the last 168 hours. No results for input(s): LIPASE, AMYLASE in the last 168 hours. No results for input(s): AMMONIA in the last 168 hours. CBC:  Recent Labs Lab 08/03/15 1202 08/03/15 1808 08/04/15 0746  WBC 6.6 5.7 4.8  HGB 12.9 11.2* 12.6  HCT 40.9 35.7* 40.2  MCV 93.4 91.5 91.6  PLT 188 191 197   Cardiac Enzymes: No results for input(s): CKTOTAL, CKMB, CKMBINDEX, TROPONINI in the last 168 hours. BNP (last 3 results) No results for input(s): PROBNP in the last 8760 hours. CBG:  Recent Labs Lab 08/03/15 1131 08/04/15 1642 08/05/15 0633 08/05/15 1139  GLUCAP 95 93 80 88    Recent Results (from the past 240 hour(s))  Urine culture     Status: None   Collection Time: 08/03/15  2:16 PM  Result Value Ref Range Status   Specimen Description URINE, CATHETERIZED  Final   Special Requests NONE  Final   Culture MULTIPLE SPECIES PRESENT, SUGGEST RECOLLECTION  Final   Report Status 08/04/2015 FINAL  Final         Studies: Ct Angio Head W/cm &/or Wo Cm  08/04/2015  CLINICAL DATA:  Stroke. EXAM: CT ANGIOGRAPHY HEAD AND NECK TECHNIQUE: Multidetector CT imaging of the head and neck was performed using the standard protocol during bolus administration of intravenous contrast. Multiplanar CT image reconstructions and MIPs were obtained  to evaluate the vascular anatomy. Carotid stenosis measurements (when applicable) are obtained utilizing NASCET criteria, using the distal internal carotid diameter as the denominator. CONTRAST:  50 cc Omnipaque 350 intravenous COMPARISON:  Brain MRI from yesterday FINDINGS: CT HEAD Brain: Extensive chronic ischemic injury with numerous small vessel infarcts throughout the atrophic bilateral cerebellum. Chronic small cortical and white matter infarcts in the right frontal and parasagittal left occipital lobes. The acute right temporal cortex infarct is not visible by CT. No hydrocephalus or hemorrhage. Calvarium and skull base: Negative Paranasal sinuses: Clear where seen Orbits: Bilateral cataract resection.  No acute finding CTA NECK Aortic arch: No visualized aneurysm or dissection. Three vessel branching. Right carotid system: Minimal atheromatous change. No stenosis, dissection, or evidence of plaque ulceration. Left carotid system: Minimal atheromatous change. No stenosis, dissection, or plaque ulceration. Vertebral arteries:Codominant. No stenosis. No evidence of vasculopathy Skeleton: No contributory findings. Spondylosis and facet arthropathy arthropathy. Bulky posterior endplate spurring and ossification at C5-6 with probable cord contact. Other neck: Multi nodular goiter. CTA HEAD Anterior circulation: No stenosis, beading,  or major vessel occlusion. Negative for aneurysm. Posterior circulation: No stenosis, beading, or major vessel occlusion. Negative for aneurysm. Venous sinuses: Patent Anatomic variants: None significant. Intact and balanced circle-of-Willis. Delayed phase: No parenchymal enhancement IMPRESSION: 1. No acute arterial finding.  No flow limiting stenosis. 2. Minimal atherosclerosis for age and in comparison to the degree of advanced intracranial ischemic injury. Electronically Signed   By: Marnee Spring M.D.   On: 08/04/2015 06:54   Dg Chest 2 View  08/03/2015  CLINICAL DATA:   Dizziness EXAM: CHEST  2 VIEW COMPARISON:  03/30/2005 FINDINGS: The heart size and mediastinal contours are within normal limits. Both lungs are clear. The visualized skeletal structures are unremarkable. IMPRESSION: No active cardiopulmonary disease. Electronically Signed   By: Alcide Clever M.D.   On: 08/03/2015 20:29   Ct Angio Neck W/cm &/or Wo/cm  08/04/2015  CLINICAL DATA:  Stroke. EXAM: CT ANGIOGRAPHY HEAD AND NECK TECHNIQUE: Multidetector CT imaging of the head and neck was performed using the standard protocol during bolus administration of intravenous contrast. Multiplanar CT image reconstructions and MIPs were obtained to evaluate the vascular anatomy. Carotid stenosis measurements (when applicable) are obtained utilizing NASCET criteria, using the distal internal carotid diameter as the denominator. CONTRAST:  50 cc Omnipaque 350 intravenous COMPARISON:  Brain MRI from yesterday FINDINGS: CT HEAD Brain: Extensive chronic ischemic injury with numerous small vessel infarcts throughout the atrophic bilateral cerebellum. Chronic small cortical and white matter infarcts in the right frontal and parasagittal left occipital lobes. The acute right temporal cortex infarct is not visible by CT. No hydrocephalus or hemorrhage. Calvarium and skull base: Negative Paranasal sinuses: Clear where seen Orbits: Bilateral cataract resection.  No acute finding CTA NECK Aortic arch: No visualized aneurysm or dissection. Three vessel branching. Right carotid system: Minimal atheromatous change. No stenosis, dissection, or evidence of plaque ulceration. Left carotid system: Minimal atheromatous change. No stenosis, dissection, or plaque ulceration. Vertebral arteries:Codominant. No stenosis. No evidence of vasculopathy Skeleton: No contributory findings. Spondylosis and facet arthropathy arthropathy. Bulky posterior endplate spurring and ossification at C5-6 with probable cord contact. Other neck: Multi nodular goiter. CTA  HEAD Anterior circulation: No stenosis, beading, or major vessel occlusion. Negative for aneurysm. Posterior circulation: No stenosis, beading, or major vessel occlusion. Negative for aneurysm. Venous sinuses: Patent Anatomic variants: None significant. Intact and balanced circle-of-Willis. Delayed phase: No parenchymal enhancement IMPRESSION: 1. No acute arterial finding.  No flow limiting stenosis. 2. Minimal atherosclerosis for age and in comparison to the degree of advanced intracranial ischemic injury. Electronically Signed   By: Marnee Spring M.D.   On: 08/04/2015 06:54   Mr Brain Wo Contrast  08/03/2015  CLINICAL DATA:  Increasing lightheadedness. History of hypertension, diabetes, and hyperlipidemia. Initial encounter. EXAM: MRI HEAD WITHOUT CONTRAST TECHNIQUE: Multiplanar, multiecho pulse sequences of the brain and surrounding structures were obtained without intravenous contrast. COMPARISON:  MR head 02/11/2007. FINDINGS: The patient was unable to remain motionless for the exam. Small or subtle lesions could be overlooked. There is a small focus of restricted diffusion along the RIGHT lateral/inferior margin of the temporal lobe. This is nonhemorrhagic. This corresponds to RIGHT MCA vascular territory involvement. No other areas of restricted diffusion are seen. No acute hemorrhage, mass lesion, or extra-axial hematoma. Generalized atrophy. Hydrocephalus ex vacuo. Extensive chronic microvascular ischemic change. Widespread areas of chronic cerebral and cerebellar infarction, including RIGHT frontal, LEFT parieto-occipital, and BILATERAL cerebellar, without significant chronic hemorrhage. BILATERAL subdural hygromas/ prominence of the extracerebral CSF spaces due to atrophy.  Flow voids are maintained. No midline abnormality. Extracranial soft tissues unremarkable. IMPRESSION: Small focus of acute infarction affecting the RIGHT lateral and inferior temporal lobe. This is nonhemorrhagic. Extensive  atrophy and chronic microvascular ischemic change with numerous areas of chronic infarction. No evidence for large vessel occlusion. Electronically Signed   By: Elsie Stain M.D.   On: 08/03/2015 14:12        Scheduled Meds: . aspirin EC  325 mg Oral Daily  . enoxaparin (LOVENOX) injection  40 mg Subcutaneous Q24H  . insulin aspart  0-9 Units Subcutaneous TID WC  . mirtazapine  15 mg Oral QHS  . rosuvastatin  5 mg Oral Daily   Continuous Infusions:   Principal Problem:   Acute CVA (cerebrovascular accident) (HCC) Active Problems:   Hyperlipidemia   Hypertension   CVA (cerebral infarction)   Diabetes (HCC)   History of TIA (transient ischemic attack)   CKD (chronic kidney disease)    Time spent: 20 minutes.    Marcellus Scott, MD, FACP, FHM. [facility] Pager [phone]  If 7PM-7AM, please contact night-coverage [email or web address] Password [email or web address] 08/05/2015, 12:09 PM    LOS: 2 days

## 2015-08-06 ENCOUNTER — Encounter (HOSPITAL_COMMUNITY): Payer: Self-pay

## 2015-08-06 ENCOUNTER — Encounter (HOSPITAL_COMMUNITY)
Admission: EM | Disposition: A | Discharge status: Home / Self Care | Payer: Self-pay | Source: Home / Self Care | Attending: Internal Medicine

## 2015-08-06 ENCOUNTER — Inpatient Hospital Stay (HOSPITAL_COMMUNITY): Payer: Medicare Other

## 2015-08-06 DIAGNOSIS — I340 Nonrheumatic mitral (valve) insufficiency: Secondary | ICD-10-CM

## 2015-08-06 DIAGNOSIS — I639 Cerebral infarction, unspecified: Secondary | ICD-10-CM

## 2015-08-06 HISTORY — PX: TEE WITHOUT CARDIOVERSION: SHX5443

## 2015-08-06 HISTORY — PX: EP IMPLANTABLE DEVICE: SHX172B

## 2015-08-06 LAB — GLUCOSE, CAPILLARY
GLUCOSE-CAPILLARY: 85 mg/dL (ref 65–99)
GLUCOSE-CAPILLARY: 77 mg/dL (ref 65–99)
GLUCOSE-CAPILLARY: 70 mg/dL (ref 65–99)
Glucose-Capillary: 49 mg/dL — ABNORMAL LOW (ref 65–99)
Glucose-Capillary: 82 mg/dL (ref 65–99)

## 2015-08-06 SURGERY — LOOP RECORDER INSERTION

## 2015-08-06 SURGERY — ECHOCARDIOGRAM, TRANSESOPHAGEAL
Anesthesia: Moderate Sedation

## 2015-08-06 MED ORDER — LIDOCAINE-EPINEPHRINE 1 %-1:100000 IJ SOLN
INTRAMUSCULAR | Status: DC | PRN
  Administered 2015-08-06: 15 mL

## 2015-08-06 MED ORDER — MIDAZOLAM HCL 5 MG/ML IJ SOLN
INTRAMUSCULAR | Status: AC
  Filled 2015-08-06: qty 2

## 2015-08-06 MED ORDER — FENTANYL CITRATE (PF) 100 MCG/2ML IJ SOLN
INTRAMUSCULAR | Status: DC | PRN
  Administered 2015-08-06 (×3): 25 ug via INTRAVENOUS

## 2015-08-06 MED ORDER — MIDAZOLAM HCL 10 MG/2ML IJ SOLN
INTRAMUSCULAR | Status: DC | PRN
  Administered 2015-08-06 (×3): 1 mg via INTRAVENOUS
  Administered 2015-08-06: 2 mg via INTRAVENOUS

## 2015-08-06 MED ORDER — BUTAMBEN-TETRACAINE-BENZOCAINE 2-2-14 % EX AERO
INHALATION_SPRAY | CUTANEOUS | Status: DC | PRN
  Administered 2015-08-06: 1 via TOPICAL
  Administered 2015-08-06: 2 via TOPICAL

## 2015-08-06 MED ORDER — LIDOCAINE VISCOUS 2 % MT SOLN
OROMUCOSAL | Status: AC
  Filled 2015-08-06: qty 15

## 2015-08-06 MED ORDER — FENTANYL CITRATE (PF) 100 MCG/2ML IJ SOLN
INTRAMUSCULAR | Status: AC
  Filled 2015-08-06: qty 2

## 2015-08-06 MED ORDER — LIDOCAINE-EPINEPHRINE 1 %-1:100000 IJ SOLN
INTRAMUSCULAR | Status: AC
  Filled 2015-08-06: qty 1

## 2015-08-06 SURGICAL SUPPLY — 2 items
LOOP REVEAL LINQSYS (Prosthesis & Implant Heart) ×2 IMPLANT
PACK LOOP INSERTION (CUSTOM PROCEDURE TRAY) ×3 IMPLANT

## 2015-08-06 NOTE — Progress Notes (Signed)
Physical Therapy Treatment Patient Details Name: Sherri [name] MRN: [id number] DOB: 1941-01-17 Today's Date: 08/06/2015    History of Present Illness Sherri [name] is an 75 y.o. female presenting with new onset dizziness with gait difficulty as well as slurred speech. PMHx- hypertension, diabetes mellitus and hyperlipidemia  MRI + small right temporal infarction. CT angiogram + Extensive chronic infarcts bilateral cerebellum, right frontal and parasagittal left occipital lobes.     PT Comments    Patient beginning to show carryover of correct way to use RW (when not distracted, she required cues <30% of time). With increased distractions in hall and incr tasks, she lost ability to maintain proper use (and recognized this fact!). Focused on proper use of RW in anticipation that she will need to use this when she goes home.   Follow Up Recommendations  CIR     Equipment Recommendations  None recommended by PT    Recommendations for Other Services       Precautions / Restrictions Precautions Precautions: Fall Precaution Comments: reports 1 fall in 12 mos; near fall in hospital    Mobility  Bed Mobility                  Transfers Overall transfer level: Needs assistance Equipment used: Rolling walker (2 wheeled)   Sit to Stand: Min guard         General transfer comment: pt recalled to scoot to edge of chair; subtle cue to place hands on armrests (not RW); close guard as she stood with no posterior lean  Ambulation/Gait Ambulation/Gait assistance: Min assist Ambulation Distance (Feet): 100 Feet (x2) Assistive device: Rolling walker (2 wheeled) Gait Pattern/deviations: Step-through pattern;Decreased stride length;Shuffle;Trunk flexed   Gait velocity interpretation: Below normal speed for age/gender General Gait Details: pt able to self-correct proximity to RW ~30% of time and only required subtle cues other times; when asked to search for things/read signs she  begins to shuffle her feet, stops, and then is able to look; at one point she was able to voice that she was having to "think about too many things" as RW became too far away. Returned to walking only with improvement. Assist to plan and execute how to turn in tight space with RW   Stairs            Wheelchair Mobility    Modified Rankin (Stroke Patients Only) Modified Rankin (Stroke Patients Only) Pre-Morbid Rankin Score: No significant disability Modified Rankin: Moderately severe disability     Balance           Standing balance support: No upper extremity supported Standing balance-Leahy Scale: Fair Standing balance comment: no imbalance with standing at sink to fix her hair; required steady assist when reaching down into closet and trying to look through her bag; standing reaching activities x 8 min               High Level Balance Comments: drifts rt and lt when walking and performing head turns    Cognition Arousal/Alertness: Awake/alert Behavior During Therapy: WFL for tasks assessed/performed Overall Cognitive Status: No family/caregiver present to determine baseline cognitive functioning Area of Impairment: Memory;Safety/judgement;Awareness;Problem solving;Orientation;Attention;Following commands Orientation Level: Place (initially commented as if at her home--with cues corrected) Current Attention Level: Sustained Memory: Decreased recall of precautions;Decreased short-term memory Following Commands: Follows multi-step commands inconsistently (2 step; due to decr attention and memory) Safety/Judgement: Decreased awareness of safety;Decreased awareness of deficits Awareness: Intellectual Problem Solving: Slow processing;Requires verbal cues;Requires tactile cues;Difficulty  sequencing General Comments: Patient awakened on arrival which may have contributed to her initial feeling she was at home? Required vc for removing items from her bag as she was looking  for hair pins (vs her continued trying to "dig")    Exercises      General Comments        Pertinent Vitals/Pain Pain Assessment: No/denies pain    Home Living                      Prior Function            PT Goals (current goals can now be found in the care plan section) Acute Rehab PT Goals Patient Stated Goal: to not fall and break anything Time For Goal Achievement: 08/11/15 Progress towards PT goals: Progressing toward goals (beginning to see more carryover of proper use of RW)    Frequency  Min 4X/week    PT Plan Current plan remains appropriate    Co-evaluation             End of Session Equipment Utilized During Treatment: Gait belt Activity Tolerance: Patient tolerated treatment well Patient left: in chair;with call bell/phone within reach;with chair alarm set     Time: 0812-0836 PT Time Calculation (min) (ACUTE ONLY): 24 min  Charges:  $Gait Training: 23-37 mins                    G Codes:      Jacaden Forbush 2015-08-29, 8:56 AM Pager [phone]

## 2015-08-06 NOTE — Progress Notes (Signed)
I met with pt and her spouse at bedside as well as spoke via conference call with her daughter who is a physician in [place]. We discussed that PT, OT, and SLP all feel she has progressed well functionally but would need 24/7 supervision at home. Spouse is very involved in the community and at times will not be at home to provide supervision. Family is in agreement to home health follow up of PT, OT, and SLP as well as to arrange private duty support at home to provide supervision when spouse is not at home. Spouse prefers home health rather than outpatient therapy at this time. Spouse and daughter do not want to pursue SNF. They are aware that I do not have an inpt rehab bed available for this pt today. I have discussed with RN CM. [phone]

## 2015-08-06 NOTE — Progress Notes (Signed)
PROGRESS NOTE    KAREM FARHA  JYN:[id number]  DOB: 1940/09/16  DOA: 08/03/2015 PCP: Ruthe Mannan, MD Outpatient Specialists:   Hospital course: 75 year old female with history of DM, HTN, HLD, prior strokes without significant deficit, possible dementia, presented to [facility] ED on 08/03/15 with complaints of dizziness, gait instability and slurred speech which were present when she woke up on 08/03/15 morning. MRI brain showed a small right inferior and posterior temporal ischemic infarction, multiple old strokes. Neurology consulted and admitted for stroke evaluation.   Assessment & Plan:   Acute CVA - Resultant dizziness, gait instability and slurred speech. Speech normalized. Instability and fall risk persists. - MRI brain: Small focus of acute infarction affecting the right lateral and inferior temporal lobe. Numerous areas of chronic infarction. As per neurology, right acute CVA territory infarcts, embolic secondary to unknown source. -  CTA head and neck: No acute arterial finding. No flow-limiting stenosis. - 2-D echo: Unremarkable. Results as below. - Lower extremity venous Dopplers: Negative for DVT. Patient is status post TEE to look for embolic source and arranged for 08/06/15.  - LDL: 49 - Hemoglobin A1c; 5.7. - Noncompliant with aspirin prior to admission. Now on aspirin 325 MG daily for secondary stroke prophylaxis. - PT recommends CIR consul. Rehabilitation declined and recommends SNF. - Neurology consultation appreciated.  Essential hypertension - Allow for permissive hypertension. Hold irbesartan and Diovan for now.  Type II DM - Hemoglobin A1: 5.7. - Patient apparently noncompliant with home oral medications. - SSI.  Pyuria - Urine microscopy shows few bacteria, large leukocytes but negative nitrites. No reported dysuria or urinary frequency or fevers. No antibiotics for now.  Hyperlipidemia - LDL 49, goal <70  - Continue statins.  Possible dementia -  Possibly slightly confused and may be baseline.  DVT prophylaxis: Lovenox  Code Status: Full  Family Communication: Discussed with patient and spouse  Disposition Plan: to be determined . SNF versus home with spouse and therapies. Possible discharge in the next day or 2 pending final recommendations  Consultants:  Neurology  Rehabilitation M.D.  Procedures:  2-D echo 08/04/15: Study Conclusions  - Left ventricle: The cavity size was normal. Wall thickness was normal. Systolic function was normal. The estimated ejection fraction was in the range of 55% to 60%. Wall motion was normal; there were no regional wall motion abnormalities. Left ventricular diastolic function parameters were normal. - Aortic valve: There was trivial regurgitation. - Mitral valve: There was mild to moderate regurgitation.  Bilateral lower extremity venous Dopplers 08/04/15: Summary:  - No evidence of deep vein thrombosis involving the right lower  extremity. - No evidence of deep vein thrombosis involving the left lower  extremity.  Antimicrobials:  None   Subjective:  Patient has no new complaints today  Objective: Filed Vitals:   08/06/15 1440 08/06/15 1450 08/06/15 1500 08/06/15 1510  BP: 122/71 118/68 117/71 113/81  Pulse: 73 70 61 67  Temp:      TempSrc:      Resp: Height:      Weight:      SpO2: 98% 100% 100% 98%   No intake or output data in the 24 hours ending 08/06/15 1548 Filed Weights   08/03/15 1125  Weight: 64.411 kg (142 lb)    Exam:  General exam: Patient in no acute distress alert and awake Respiratory system: Clear. No increased work of breathing. No wheezes Cardiovascular system: S1 & S2 heard, RRR. No JVD, murmurs, gallops,  clicks or pedal edema. Gastrointestinal system: Abdomen is nondistended, soft and nontender. Normal bowel sounds heard. Central nervous system: Alert and oriented to person and place.   Data Reviewed: Basic  Metabolic Panel:  Recent Labs Lab 08/03/15 1406 08/03/15 1808 08/04/15 0746  NA 147*  --  144  K 4.5  --  4.0  CL 112*  --  105  CO2 25  --  27  GLUCOSE 78  --  98  BUN 14  --  12  CREATININE 1.25* 1.19* 1.18*  CALCIUM 10.0  --  10.0   Liver Function Tests: No results for input(s): AST, ALT, ALKPHOS, BILITOT, PROT, ALBUMIN in the last 168 hours. No results for input(s): LIPASE, AMYLASE in the last 168 hours. No results for input(s): AMMONIA in the last 168 hours. CBC:  Recent Labs Lab 08/03/15 1202 08/03/15 1808 08/04/15 0746  WBC 6.6 5.7 4.8  HGB 12.9 11.2* 12.6  HCT 40.9 35.7* 40.2  MCV 93.4 91.5 91.6  PLT 188 191 197   Cardiac Enzymes: No results for input(s): CKTOTAL, CKMB, CKMBINDEX, TROPONINI in the last 168 hours. BNP (last 3 results) No results for input(s): PROBNP in the last 8760 hours. CBG:  Recent Labs Lab 08/05/15 1139 08/05/15 1623 08/05/15 2145 08/06/15 0630 08/06/15 1118  GLUCAP 88 90 85 85 77    Recent Results (from the past 240 hour(s))  Urine culture     Status: None   Collection Time: 08/03/15  2:16 PM  Result Value Ref Range Status   Specimen Description URINE, CATHETERIZED  Final   Special Requests NONE  Final   Culture MULTIPLE SPECIES PRESENT, SUGGEST RECOLLECTION  Final   Report Status 08/04/2015 FINAL  Final         Studies: No results found.      Scheduled Meds: . [MAR Hold] aspirin EC  325 mg Oral Daily  . [MAR Hold] enoxaparin (LOVENOX) injection  40 mg Subcutaneous Q24H  . [MAR Hold] insulin aspart  0-9 Units Subcutaneous TID WC  . [MAR Hold] mirtazapine  15 mg Oral QHS  . [MAR Hold] rosuvastatin  5 mg Oral Daily   Continuous Infusions:   Principal Problem:   Acute CVA (cerebrovascular accident) (HCC) Active Problems:   Hyperlipidemia   Hypertension   CVA (cerebral infarction)   Diabetes (HCC)   History of TIA (transient ischemic attack)   CKD (chronic kidney disease)    Time spent: 20  minutes.    Penny Pia, MD, FACP, [facility]. [facility] Pager [phone]  If 7PM-7AM, please contact night-coverage [email or web address] Password [email or web address] 08/06/2015, 3:48 PM    LOS: 3 days

## 2015-08-06 NOTE — Care Management Important Message (Signed)
Important Message  Patient Details  Name: Sherri [name] MRN: [id number] Date of Birth: [birth year]   Medicare Important Message Given:  Yes    [name] 08/06/2015, 3:47 PM

## 2015-08-06 NOTE — CV Procedure (Signed)
   Transesophageal Echocardiogram Note  Sherri [name] [id number] [birth year]  Procedure: Transesophageal Echocardiogram Indications: CVA  Procedure Details Consent: Obtained Time Out: Verified patient identification, verified procedure, site/side was marked, verified correct patient position, special equipment/implants available, Radiology Safety Procedures followed,  medications/allergies/relevent history reviewed, required imaging and test results available.  Performed  Medications: Fentanyl: 75 mcg Versed: 5 mg  No intracardiac source of embolism was identified.  Complications: No apparent complications Patient did tolerate procedure well.  Lars Masson, MD, [facility] 08/06/2015, 1:45 PM

## 2015-08-06 NOTE — H&P (View-Only) (Signed)
ELECTROPHYSIOLOGY CONSULT NOTE  Patient ID: Sherri [name] MRN: [id number], DOB/AGE: 1941/01/03   Admit date: 08/03/2015 Date of Consult: 08/06/2015  Primary Physician: Ruthe Mannan, MD Primary Cardiologist: none Reason for Consultation: Cryptogenic stroke ; recommendations regarding Implantable Loop Recorder  History of Present Illness Sherri [name] was admitted on 08/03/2015 with acute CVA.  They first developed symptoms of slurred speech and dizziness while upon waking in the morning at home.  Her PMHx includes, HTN, HLD, DM, TIA Imaging demonstratedright acute MCA lateral and inferior small temporal lobe infarcts, embolic secondary to unknown source. 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  she has undergone workup for stroke including echocardiogram and carotid angio.  The patient has been monitored on telemetry which has demonstrated sinus rhythm with no arrhythmias.  Inpatient stroke work-up is to be completed with a TEE.  She fels like her speech is improving, not back to her baseline/normal yet.  She denies any other symptoms, no CP, palpitations or SOB  Echocardiogram this admission demonstrated  Study Conclusions - Left ventricle: The cavity size was normal. Wall thickness was normal. Systolic function was normal. The estimated ejection fraction was in the range of 55% to 60%. Wall motion was normal; there were no regional wall motion abnormalities. Left ventricular diastolic function parameters were normal. - Aortic valve: There was trivial regurgitation. - Mitral valve: There was mild to moderate regurgitation.  Lab work is reviewed.  Prior to admission, the patient denies chest pain, shortness of breath, dizziness, palpitations, or syncope.  They are recovering from their stroke with plans likely to home at discharge.  EP has been asked to evaluate for placement of an implantable loop recorder to monitor for atrial fibrillation.   Past Medical  History  Diagnosis Date  . Hyperlipidemia   . Hypertension   . Diabetes mellitus without complication [facility])      Surgical History: History reviewed. No pertinent past surgical history.   Prescriptions prior to admission  Medication Sig Dispense Refill Last Dose  . amLODipine (NORVASC) 10 MG tablet TAKE 1 TABLET DAILY 90 tablet 1 Past Week at Unknown time  . Glucosamine-Chondroit-Vit C-Mn (GLUCOSAMINE 1500 COMPLEX) CAPS Take 1 capsule by mouth daily.    08/02/2015 at Unknown time  . mirtazapine (REMERON) 15 MG tablet Take 1 tablet (15 mg total) by mouth at bedtime. 30 tablet 3 08/02/2015 at Unknown time  . Omega-3 Fatty Acids (FISH OIL) 1000 MG CAPS Take 1 capsule by mouth daily.     08/02/2015 at Unknown time  . rosuvastatin (CRESTOR) 5 MG tablet Take 1 tablet (5 mg total) by mouth daily. 90 tablet 3 08/02/2015 at Unknown time  . valsartan-hydrochlorothiazide (DIOVAN-HCT) 160-12.5 MG tablet Take 1 tablet by mouth daily. 90 tablet 3 Past Week at Unknown time    Inpatient Medications:  . aspirin EC  325 mg Oral Daily  . enoxaparin (LOVENOX) injection  40 mg Subcutaneous Q24H  . insulin aspart  0-9 Units Subcutaneous TID WC  . mirtazapine  15 mg Oral QHS  . rosuvastatin  5 mg Oral Daily    Allergies:  Allergies  Allergen Reactions  . Hydrogen Peroxide Swelling  . Sulfa Antibiotics     Social History   Social History  . Marital Status: Married    Spouse Name: N/A  . Number of Children: N/A  . Years of Education: N/A   Occupational History  . Not on file.   Social History Main Topics  .  Smoking status: Never Smoker   . Smokeless tobacco: Never Used  . Alcohol Use: No  . Drug Use: No  . Sexual Activity: Not on file   Other Topics Concern  . Not on file   Social History Narrative   Desires CPR.   Would not want prolonged life support if futile     Family History: No known significant family medical history, her brother has mild DM that is managed with diet only at  this time.   Review of Systems: All other systems reviewed and are otherwise negative except as noted above.  Physical Exam: Filed Vitals:   08/05/15 1656 08/05/15 1949 08/06/15 0018 08/06/15 0411  BP: 122/75 133/69 112/76 116/63  Pulse: 71 67 72 67  Temp: 98.2 F (36.8 C) 98.6 F (37 C) 98.1 F (36.7 C) 98.2 F (36.8 C)  TempSrc: Oral Oral Oral Oral  Resp: Height:      Weight:      SpO2: 99% 98% 96% 93%    GEN- The patient is well appearing, alert and oriented x 3 today.   Head- normocephalic, atraumatic Eyes-  Sclera clear, conjunctiva pink Ears- hearing intact Oropharynx- clear Neck- supple Lungs- Clear to ausculation bilaterally, normal work of breathing Heart- Regular rate and rhythm, no murmurs, rubs or gallops  GI- soft, NT, ND Extremities- no clubbing, cyanosis, or edema MS- no significant deformity or atrophy Skin- no rash or lesion Psych- euthymic mood, full affect   Labs:   Lab Results  Component Value Date   WBC 4.8 08/04/2015   HGB 12.6 08/04/2015   HCT 40.2 08/04/2015   MCV 91.6 08/04/2015   PLT 197 08/04/2015    Recent Labs Lab 08/04/15 0746  NA 144  K 4.0  CL 105  CO2 27  BUN 12  CREATININE 1.18*  CALCIUM 10.0  GLUCOSE 98   Lab Results  Component Value Date   CKTOTAL 261* 02/10/2007   CKMB 2.9 02/10/2007   TROPONINI 0.03        NO INDICATION OF MYOCARDIAL INJURY. 02/10/2007   Lab Results  Component Value Date   CHOL 104 08/04/2015   CHOL 138 03/24/2015   CHOL 134 03/14/2014   Lab Results  Component Value Date   HDL 41 08/04/2015   HDL 62.60 03/24/2015   HDL 49.30 03/14/2014   Lab Results  Component Value Date   LDLCALC 49 08/04/2015   LDLCALC 60 03/24/2015   LDLCALC 59 03/14/2014   Lab Results  Component Value Date   TRIG 69 08/04/2015   TRIG 79.0 03/24/2015   TRIG 129.0 03/14/2014   Lab Results  Component Value Date   CHOLHDL 2.5 08/04/2015   CHOLHDL 2 03/24/2015   CHOLHDL 3 03/14/2014   No  results found for: LDLDIRECT  No results found for: DDIMER   Radiology/Studies:  Ct Angio Head W/cm &/or Wo Cm 08/04/2015  CLINICAL DATA:  Stroke. EXAM: CT ANGIOGRAPHY HEAD AND NECK TECHNIQUE: Multidetector CT imaging of the head and neck was performed using the standard protocol during bolus administration of intravenous contrast. Multiplanar CT image reconstructions and MIPs were obtained to evaluate the vascular anatomy. Carotid stenosis measurements (when applicable) are obtained utilizing NASCET criteria, using the distal internal carotid diameter as the denominator. CONTRAST:  50 cc Omnipaque 350 intravenous COMPARISON:  Brain MRI from yesterday FINDINGS: CT HEAD Brain: Extensive chronic ischemic injury with numerous small vessel infarcts throughout the atrophic bilateral cerebellum. Chronic small cortical and white matter  infarcts in the right frontal and parasagittal left occipital lobes. The acute right temporal cortex infarct is not visible by CT. No hydrocephalus or hemorrhage. Calvarium and skull base: Negative Paranasal sinuses: Clear where seen Orbits: Bilateral cataract resection.  No acute finding CTA NECK Aortic arch: No visualized aneurysm or dissection. Three vessel branching. Right carotid system: Minimal atheromatous change. No stenosis, dissection, or evidence of plaque ulceration. Left carotid system: Minimal atheromatous change. No stenosis, dissection, or plaque ulceration. Vertebral arteries:Codominant. No stenosis. No evidence of vasculopathy Skeleton: No contributory findings. Spondylosis and facet arthropathy arthropathy. Bulky posterior endplate spurring and ossification at C5-6 with probable cord contact. Other neck: Multi nodular goiter. CTA HEAD Anterior circulation: No stenosis, beading, or major vessel occlusion. Negative for aneurysm. Posterior circulation: No stenosis, beading, or major vessel occlusion. Negative for aneurysm. Venous sinuses: Patent Anatomic variants: None  significant. Intact and balanced circle-of-Willis. Delayed phase: No parenchymal enhancement IMPRESSION: 1. No acute arterial finding.  No flow limiting stenosis. 2. Minimal atherosclerosis for age and in comparison to the degree of advanced intracranial ischemic injury. Electronically Signed   By: Marnee Spring M.D.   On: 08/04/2015 06:54   Dg Chest 2 View 08/03/2015  CLINICAL DATA:  Dizziness EXAM: CHEST  2 VIEW COMPARISON:  03/30/2005 FINDINGS: The heart size and mediastinal contours are within normal limits. Both lungs are clear. The visualized skeletal structures are unremarkable. IMPRESSION: No active cardiopulmonary disease. Electronically Signed   By: Alcide Clever M.D.   On: 08/03/2015 20:29   Mr Brain Wo Contrast 08/03/2015  CLINICAL DATA:  Increasing lightheadedness. History of hypertension, diabetes, and hyperlipidemia. Initial encounter. EXAM: MRI HEAD WITHOUT CONTRAST TECHNIQUE: Multiplanar, multiecho pulse sequences of the brain and surrounding structures were obtained without intravenous contrast. COMPARISON:  MR head 02/11/2007. FINDINGS: The patient was unable to remain motionless for the exam. Small or subtle lesions could be overlooked. There is a small focus of restricted diffusion along the RIGHT lateral/inferior margin of the temporal lobe. This is nonhemorrhagic. This corresponds to RIGHT MCA vascular territory involvement. No other areas of restricted diffusion are seen. No acute hemorrhage, mass lesion, or extra-axial hematoma. Generalized atrophy. Hydrocephalus ex vacuo. Extensive chronic microvascular ischemic change. Widespread areas of chronic cerebral and cerebellar infarction, including RIGHT frontal, LEFT parieto-occipital, and BILATERAL cerebellar, without significant chronic hemorrhage. BILATERAL subdural hygromas/ prominence of the extracerebral CSF spaces due to atrophy. Flow voids are maintained. No midline abnormality. Extracranial soft tissues unremarkable. IMPRESSION:  Small focus of acute infarction affecting the RIGHT lateral and inferior temporal lobe. This is nonhemorrhagic. Extensive atrophy and chronic microvascular ischemic change with numerous areas of chronic infarction. No evidence for large vessel occlusion. Electronically Signed   By: Elsie Stain M.D.   On: 08/03/2015 14:12    12-lead ECG SR All prior EKG's in EPIC reviewed with no documented atrial fibrillation  Telemetry SR only  Assessment and Plan:  1. Cryptogenic stroke The patient presents with cryptogenic stroke.  The patient has a TEE planned for this AM.  I spoke at length with the patient about monitoring for afib with either a 30 day event monitor or an implantable loop recorder.  Risks, benefits, and alteratives to implantable loop recorder were discussed with the patient today.   At this time, the patient is undecidedand would like to discuss with her husband and brother prior to a final decision regarding ILR or EM.    I was able to discuss with the patient's husband at bedside and her daughter  Trina via telephone at the patient's request,  EM vs ILR, risks, benefits of the ILR procedure, they are agreeable to proceed with the loop implant, the patient at this time after discussion with family and with their support is agreeable to proceed.  Wound care instructions were discussed, wound check visit is scheduled 08/15/15 @ 2:30PM   Sheilah Pigeon, PA-C 08/06/2015   I have seen, examined the patient, and reviewed the above assessment and plan.  On exam, RRR.  Changes to above are made where necessary.  Risks and benefits of implantable loop recorder were discussed at length with patient and her husband.  They are clear that they would like to proceed at this time.   Co Sign: Hillis Range, MD 08/06/2015 1:17 PM

## 2015-08-06 NOTE — Consult Note (Signed)
  ELECTROPHYSIOLOGY CONSULT NOTE  Patient ID: Sherri [name] MRN: [id number], DOB/AGE: 09/02/1940   Admit date: 08/03/2015 Date of Consult: 08/06/2015  Primary Physician: Talia Aron, MD Primary Cardiologist: none Reason for Consultation: Cryptogenic stroke ; recommendations regarding Implantable Loop Recorder  History of Present Illness Ivonne S Wenzlick was admitted on 08/03/2015 with acute CVA.  They first developed symptoms of slurred speech and dizziness while upon waking in the morning at home.  Her PMHx includes, HTN, HLD, DM, TIA Imaging demonstratedright acute MCA lateral and inferior small temporal lobe infarcts, embolic secondary to unknown source. 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  she has undergone workup for stroke including echocardiogram and carotid angio.  The patient has been monitored on telemetry which has demonstrated sinus rhythm with no arrhythmias.  Inpatient stroke work-up is to be completed with a TEE.  She fels like her speech is improving, not back to her baseline/normal yet.  She denies any other symptoms, no CP, palpitations or SOB  Echocardiogram this admission demonstrated  Study Conclusions - Left ventricle: The cavity size was normal. Wall thickness was normal. Systolic function was normal. The estimated ejection fraction was in the range of 55% to 60%. Wall motion was normal; there were no regional wall motion abnormalities. Left ventricular diastolic function parameters were normal. - Aortic valve: There was trivial regurgitation. - Mitral valve: There was mild to moderate regurgitation.  Lab work is reviewed.  Prior to admission, the patient denies chest pain, shortness of breath, dizziness, palpitations, or syncope.  They are recovering from their stroke with plans likely to home at discharge.  EP has been asked to evaluate for placement of an implantable loop recorder to monitor for atrial fibrillation.   Past Medical  History  Diagnosis Date  . Hyperlipidemia   . Hypertension   . Diabetes mellitus without complication (HCC)      Surgical History: History reviewed. No pertinent past surgical history.   Prescriptions prior to admission  Medication Sig Dispense Refill Last Dose  . amLODipine (NORVASC) 10 MG tablet TAKE 1 TABLET DAILY 90 tablet 1 Past Week at Unknown time  . Glucosamine-Chondroit-Vit C-Mn (GLUCOSAMINE 1500 COMPLEX) CAPS Take 1 capsule by mouth daily.    08/02/2015 at Unknown time  . mirtazapine (REMERON) 15 MG tablet Take 1 tablet (15 mg total) by mouth at bedtime. 30 tablet 3 08/02/2015 at Unknown time  . Omega-3 Fatty Acids (FISH OIL) 1000 MG CAPS Take 1 capsule by mouth daily.     08/02/2015 at Unknown time  . rosuvastatin (CRESTOR) 5 MG tablet Take 1 tablet (5 mg total) by mouth daily. 90 tablet 3 08/02/2015 at Unknown time  . valsartan-hydrochlorothiazide (DIOVAN-HCT) 160-12.5 MG tablet Take 1 tablet by mouth daily. 90 tablet 3 Past Week at Unknown time    Inpatient Medications:  . aspirin EC  325 mg Oral Daily  . enoxaparin (LOVENOX) injection  40 mg Subcutaneous Q24H  . insulin aspart  0-9 Units Subcutaneous TID WC  . mirtazapine  15 mg Oral QHS  . rosuvastatin  5 mg Oral Daily    Allergies:  Allergies  Allergen Reactions  . Hydrogen Peroxide Swelling  . Sulfa Antibiotics     Social History   Social History  . Marital Status: Married    Spouse Name: N/A  . Number of Children: N/A  . Years of Education: N/A   Occupational History  . Not on file.   Social History Main Topics  .   Smoking status: Never Smoker   . Smokeless tobacco: Never Used  . Alcohol Use: No  . Drug Use: No  . Sexual Activity: Not on file   Other Topics Concern  . Not on file   Social History Narrative   Desires CPR.   Would not want prolonged life support if futile     Family History: No known significant family medical history, her brother has mild DM that is managed with diet only at  this time.   Review of Systems: All other systems reviewed and are otherwise negative except as noted above.  Physical Exam: Filed Vitals:   08/05/15 1656 08/05/15 1949 08/06/15 0018 08/06/15 0411  BP: 122/75 133/69 112/76 116/63  Pulse: 71 67 72 67  Temp: 98.2 F (36.8 C) 98.6 F (37 C) 98.1 F (36.7 C) 98.2 F (36.8 C)  TempSrc: Oral Oral Oral Oral  Resp: 16 18 18 18  Height:      Weight:      SpO2: 99% 98% 96% 93%    GEN- The patient is well appearing, alert and oriented x 3 today.   Head- normocephalic, atraumatic Eyes-  Sclera clear, conjunctiva pink Ears- hearing intact Oropharynx- clear Neck- supple Lungs- Clear to ausculation bilaterally, normal work of breathing Heart- Regular rate and rhythm, no murmurs, rubs or gallops  GI- soft, NT, ND Extremities- no clubbing, cyanosis, or edema MS- no significant deformity or atrophy Skin- no rash or lesion Psych- euthymic mood, full affect   Labs:   Lab Results  Component Value Date   WBC 4.8 08/04/2015   HGB 12.6 08/04/2015   HCT 40.2 08/04/2015   MCV 91.6 08/04/2015   PLT 197 08/04/2015    Recent Labs Lab 08/04/15 0746  NA 144  K 4.0  CL 105  CO2 27  BUN 12  CREATININE 1.18*  CALCIUM 10.0  GLUCOSE 98   Lab Results  Component Value Date   CKTOTAL 261* 02/10/2007   CKMB 2.9 02/10/2007   TROPONINI 0.03        NO INDICATION OF MYOCARDIAL INJURY. 02/10/2007   Lab Results  Component Value Date   CHOL 104 08/04/2015   CHOL 138 03/24/2015   CHOL 134 03/14/2014   Lab Results  Component Value Date   HDL 41 08/04/2015   HDL 62.60 03/24/2015   HDL 49.30 03/14/2014   Lab Results  Component Value Date   LDLCALC 49 08/04/2015   LDLCALC 60 03/24/2015   LDLCALC 59 03/14/2014   Lab Results  Component Value Date   TRIG 69 08/04/2015   TRIG 79.0 03/24/2015   TRIG 129.0 03/14/2014   Lab Results  Component Value Date   CHOLHDL 2.5 08/04/2015   CHOLHDL 2 03/24/2015   CHOLHDL 3 03/14/2014   No  results found for: LDLDIRECT  No results found for: DDIMER   Radiology/Studies:  Ct Angio Head W/cm &/or Wo Cm 08/04/2015  CLINICAL DATA:  Stroke. EXAM: CT ANGIOGRAPHY HEAD AND NECK TECHNIQUE: Multidetector CT imaging of the head and neck was performed using the standard protocol during bolus administration of intravenous contrast. Multiplanar CT image reconstructions and MIPs were obtained to evaluate the vascular anatomy. Carotid stenosis measurements (when applicable) are obtained utilizing NASCET criteria, using the distal internal carotid diameter as the denominator. CONTRAST:  50 cc Omnipaque 350 intravenous COMPARISON:  Brain MRI from yesterday FINDINGS: CT HEAD Brain: Extensive chronic ischemic injury with numerous small vessel infarcts throughout the atrophic bilateral cerebellum. Chronic small cortical and white matter   infarcts in the right frontal and parasagittal left occipital lobes. The acute right temporal cortex infarct is not visible by CT. No hydrocephalus or hemorrhage. Calvarium and skull base: Negative Paranasal sinuses: Clear where seen Orbits: Bilateral cataract resection.  No acute finding CTA NECK Aortic arch: No visualized aneurysm or dissection. Three vessel branching. Right carotid system: Minimal atheromatous change. No stenosis, dissection, or evidence of plaque ulceration. Left carotid system: Minimal atheromatous change. No stenosis, dissection, or plaque ulceration. Vertebral arteries:Codominant. No stenosis. No evidence of vasculopathy Skeleton: No contributory findings. Spondylosis and facet arthropathy arthropathy. Bulky posterior endplate spurring and ossification at C5-6 with probable cord contact. Other neck: Multi nodular goiter. CTA HEAD Anterior circulation: No stenosis, beading, or major vessel occlusion. Negative for aneurysm. Posterior circulation: No stenosis, beading, or major vessel occlusion. Negative for aneurysm. Venous sinuses: Patent Anatomic variants: None  significant. Intact and balanced circle-of-Willis. Delayed phase: No parenchymal enhancement IMPRESSION: 1. No acute arterial finding.  No flow limiting stenosis. 2. Minimal atherosclerosis for age and in comparison to the degree of advanced intracranial ischemic injury. Electronically Signed   By: Jonathon  Watts M.D.   On: 08/04/2015 06:54   Dg Chest 2 View 08/03/2015  CLINICAL DATA:  Dizziness EXAM: CHEST  2 VIEW COMPARISON:  03/30/2005 FINDINGS: The heart size and mediastinal contours are within normal limits. Both lungs are clear. The visualized skeletal structures are unremarkable. IMPRESSION: No active cardiopulmonary disease. Electronically Signed   By: Mark  Lukens M.D.   On: 08/03/2015 20:29   Mr Brain Wo Contrast 08/03/2015  CLINICAL DATA:  Increasing lightheadedness. History of hypertension, diabetes, and hyperlipidemia. Initial encounter. EXAM: MRI HEAD WITHOUT CONTRAST TECHNIQUE: Multiplanar, multiecho pulse sequences of the brain and surrounding structures were obtained without intravenous contrast. COMPARISON:  MR head 02/11/2007. FINDINGS: The patient was unable to remain motionless for the exam. Small or subtle lesions could be overlooked. There is a small focus of restricted diffusion along the RIGHT lateral/inferior margin of the temporal lobe. This is nonhemorrhagic. This corresponds to RIGHT MCA vascular territory involvement. No other areas of restricted diffusion are seen. No acute hemorrhage, mass lesion, or extra-axial hematoma. Generalized atrophy. Hydrocephalus ex vacuo. Extensive chronic microvascular ischemic change. Widespread areas of chronic cerebral and cerebellar infarction, including RIGHT frontal, LEFT parieto-occipital, and BILATERAL cerebellar, without significant chronic hemorrhage. BILATERAL subdural hygromas/ prominence of the extracerebral CSF spaces due to atrophy. Flow voids are maintained. No midline abnormality. Extracranial soft tissues unremarkable. IMPRESSION:  Small focus of acute infarction affecting the RIGHT lateral and inferior temporal lobe. This is nonhemorrhagic. Extensive atrophy and chronic microvascular ischemic change with numerous areas of chronic infarction. No evidence for large vessel occlusion. Electronically Signed   By: John T Curnes M.D.   On: 08/03/2015 14:12    12-lead ECG SR All prior EKG's in EPIC reviewed with no documented atrial fibrillation  Telemetry SR only  Assessment and Plan:  1. Cryptogenic stroke The patient presents with cryptogenic stroke.  The patient has a TEE planned for this AM.  I spoke at length with the patient about monitoring for afib with either a 30 day event monitor or an implantable loop recorder.  Risks, benefits, and alteratives to implantable loop recorder were discussed with the patient today.   At this time, the patient is undecidedand would like to discuss with her husband and brother prior to a final decision regarding ILR or EM.    I was able to discuss with the patient's husband at bedside and her daughter   Trina via telephone at the patient's request,  EM vs ILR, risks, benefits of the ILR procedure, they are agreeable to proceed with the loop implant, the patient at this time after discussion with family and with their support is agreeable to proceed.  Wound care instructions were discussed, wound check visit is scheduled 08/15/15 @ 2:30PM   Renee Lynn Ursuy, PA-C 08/06/2015   I have seen, examined the patient, and reviewed the above assessment and plan.  On exam, RRR.  Changes to above are made where necessary.  Risks and benefits of implantable loop recorder were discussed at length with patient and her husband.  They are clear that they would like to proceed at this time.   Co Sign: Wynne Jury, MD 08/06/2015 1:17 PM   

## 2015-08-06 NOTE — Progress Notes (Signed)
STROKE TEAM PROGRESS NOTE   SUBJECTIVE (INTERVAL HISTORY) Husband at bedside. Pt just came back from TEE and loop recorder placement. No acute issue. Pending CIR.    OBJECTIVE Temp:  [98.1 F (36.7 C)-98.6 F (37 C)] 98.2 F (36.8 C) (03/01 0915) Pulse Rate:  [67-80] 74 (03/01 0915) Cardiac Rhythm:  [-] Normal sinus rhythm (03/01 0825) Resp:  [16-18] 16 (03/01 0915) BP: (107-133)/(63-76) 107/63 mmHg (03/01 0915) SpO2:  [93 %-99 %] 97 % (03/01 0915)  CBC:   Recent Labs Lab 08/03/15 1808 08/04/15 0746  WBC 5.7 4.8  HGB 11.2* 12.6  HCT 35.7* 40.2  MCV 91.5 91.6  PLT 191 197   Basic Metabolic Panel:   Recent Labs Lab 08/03/15 1406 08/03/15 1808 08/04/15 0746  NA 147*  --  144  K 4.5  --  4.0  CL 112*  --  105  CO2 25  --  27  GLUCOSE 78  --  98  BUN 14  --  12  CREATININE 1.25* 1.19* 1.18*  CALCIUM 10.0  --  10.0    Lipid Panel:     Component Value Date/Time   CHOL 104 08/04/2015 0344   TRIG 69 08/04/2015 0344   HDL 41 08/04/2015 0344   CHOLHDL 2.5 08/04/2015 0344   VLDL 14 08/04/2015 0344   LDLCALC 49 08/04/2015 0344   HgbA1c:  Lab Results  Component Value Date   HGBA1C 5.7* 08/04/2015   Urine Drug Screen:     Component Value Date/Time   LABOPIA NONE DETECTED 10/13/2010 1647   COCAINSCRNUR NONE DETECTED 10/13/2010 1647   LABBENZ NONE DETECTED 10/13/2010 1647   AMPHETMU NONE DETECTED 10/13/2010 1647   THCU NONE DETECTED 10/13/2010 1647   LABBARB  10/13/2010 1647    NONE DETECTED        DRUG SCREEN FOR MEDICAL PURPOSES ONLY.  IF CONFIRMATION IS NEEDED FOR ANY PURPOSE, NOTIFY LAB WITHIN 5 DAYS.        LOWEST DETECTABLE LIMITS FOR URINE DRUG SCREEN Drug Class       Cutoff (ng/mL) Amphetamine      1000 Barbiturate      200 Benzodiazepine   200 Tricyclics       300 Opiates          300 Cocaine          300 THC              50      IMAGING I have personally reviewed the radiological images below and agree with the radiology  interpretations.  Dg Chest 2 View 08/03/2015  No active cardiopulmonary disease.   Ct Angio Head & Neck W/cm &/or Wo/cm 08/04/2015   1. No acute arterial finding.  No flow limiting stenosis. 2. Minimal atherosclerosis for age and in comparison to the degree of advanced intracranial ischemic injury.   Mr Brain Wo Contrast 08/03/2015  Small focus of acute infarction affecting the RIGHT lateral and inferior temporal lobe. This is nonhemorrhagic. Extensive atrophy and chronic microvascular ischemic change with numerous areas of chronic infarction. No evidence for large vessel occlusion.    LE venous doppler - negative  TTE  - Left ventricle: The cavity size was normal. Wall thickness wasnormal. Systolic function was normal. The estimated ejectionfraction was in the range of 55% to 60%. Wall motion was normal;there were no regional wall motion abnormalities. Leftventricular diastolic function parameters were normal. - Aortic valve: There was trivial regurgitation. - Mitral valve: There was mild to moderate  regurgitation.  CTA head and neck 1. No acute arterial finding. No flow limiting stenosis. 2. Minimal atherosclerosis for age and in comparison to the degree of advanced intracranial ischemic injury.  TEE No intracardiac source of embolism was identified.   PHYSICAL EXAM Temp:  [98.1 F (36.7 C)-98.6 F (37 C)] 98.2 F (36.8 C) (03/01 0915) Pulse Rate:  [67-80] 74 (03/01 0915) Resp:  [16-18] 16 (03/01 0915) BP: (107-133)/(63-76) 107/63 mmHg (03/01 0915) SpO2:  [93 %-99 %] 97 % (03/01 0915)  General - Well nourished, well developed, in no apparent distress.  Ophthalmologic - Fundi not visualized due to noncooperation.  Cardiovascular - Regular rate and rhythm with no murmur.  Mental Status -  Level of arousal and orientation to time, place, and person were intact. Language including expression, naming, repetition, comprehension was assessed and found intact. Fund of  Knowledge was assessed and was intact.  Cranial Nerves II - XII - II - Visual field intact OU, lower part visual field decreased visual acuity. III, IV, VI - Extraocular movements intact V - Facial sensation intact bilaterally. VII - Facial movement intact bilaterally. VIII - Hearing & vestibular intact bilaterally. X - Palate elevates symmetrically, mild dysarthria. XI - Chin turning & shoulder shrug intact bilaterally. XII - Tongue protrusion intact.  Motor Strength - The patient's strength was normal in all extremities and pronator drift was absent.  Bulk was normal and fasciculations were absent.   Motor Tone - Muscle tone was assessed at the neck and appendages and was normal.  Reflexes - The patient's reflexes were 1+ in all extremities and she had no pathological reflexes.  Sensory - Light touch, temperature/pinprick, vibration and proprioception, and Romberg testing were assessed and were symmetrical.    Coordination - The patient had normal movements in the hands and feet with no ataxia or dysmetria.  Tremor was absent.  Gait and Station - deferred to PT due to safety concerns   ASSESSMENT/PLAN [name] is a 75 y.o. female with history of hypertension, diabetes, hyperlipidemia and TIA presenting with slurred speech, gait difficulty and dizziness. She did not receive IV t-PA due to delay in arrival.   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 Pt also has old right frontal and left parietal infarcts  MRI  Acute right lateral and inferior temporal lobe  CTA Head and neck no flow-limiting stenosis  2D Echo  unremarkable  Lower extremity venous Doppler negative for DVT  TEE negative for embolic source.   Loop recorder placed to evaluate for atrial fibrillation as etiology of stroke 08/06/2015  LDL 49  HgbA1c 5.7  Lovenox 40 mg sq daily for VTE prophylaxis Diet NPO time specified  No  antithrombotic prior to admission, now on aspirin 325 mg daily.   Patient counseled to be compliant with her antithrombotic medications  Ongoing aggressive stroke risk factor management  Therapy recommendations:  CIR  Disposition:  Pending   Hypertension  Stable  BP goal normotensive  Hyperlipidemia  Home meds:  Crestor 5 and fish oil, Crestor resumed in hospital  LDL 49, goal < 70  Continue statin at discharge  Diabetes type 2  HgbA1c 5.7, goal < 7.0  Reported non-compliance with home meds  Other Stroke Risk Factors  Advanced age  Hx TIA/stroke  10/2010 - R sided weakness  Other Active Problems  Pyuria, culture with multiple species, no tx  Hospital day # 3  Neurology will  sign off. Please call with questions. Pt will follow up with Dr. Roda Shutters at [facility] in about 2 months. Thanks for the consult.  Marvel Plan, MD PhD Stroke Neurology 08/06/2015 6:20 PM     To contact Stroke Continuity provider, please refer to [email or web address]. After hours, contact General Neurology

## 2015-08-06 NOTE — Interval H&P Note (Signed)
History and Physical Interval Note:  08/06/2015 1:18 PM  Sherri [name]  has presented today for surgery, with the diagnosis of syncope  The various methods of treatment have been discussed with the patient and family. After consideration of risks, benefits and other options for treatment, the patient has consented to  Procedure(s): Loop Recorder Insertion (N/A) as a surgical intervention .  The patient's history has been reviewed, patient examined, no change in status, stable for surgery.  I have reviewed the patient's chart and labs.  Questions were answered to the patient's satisfaction.     Hillis Range

## 2015-08-06 NOTE — Discharge Instructions (Signed)
° ° °  WOUND CARE - Keep the wound area clean and dry.  Do not get this area wet for 3 days. No showers for 3 days; you may shower on  08/10/15  . - The tape/steri-strips on your wound will fall off; do not pull them off.  No bandage is needed on the site.  DO  NOT apply any creams, oils, or ointments to the wound area. - If you notice any drainage or discharge from the wound, any swelling or bruising at the site, or you develop a fever > 101? F after you are discharged home, call the office at once.

## 2015-08-06 NOTE — Interval H&P Note (Signed)
History and Physical Interval Note:  08/06/2015 12:43 PM  Sherri [name]  has presented today for surgery, with the diagnosis of STROKE  The various methods of treatment have been discussed with the patient and family. After consideration of risks, benefits and other options for treatment, the patient has consented to  Procedure(s): TRANSESOPHAGEAL ECHOCARDIOGRAM (TEE) (N/A) as a surgical intervention .  The patient's history has been reviewed, patient examined, no change in status, stable for surgery.  I have reviewed the patient's chart and labs.  Questions were answered to the patient's satisfaction.     Lars Masson

## 2015-08-06 NOTE — H&P (View-Only) (Signed)
PROGRESS NOTE    Sherri [name]  UJW:[id number]  DOB: 05-11-1941  DOA: 08/03/2015 PCP: Ruthe Mannan, MD Outpatient Specialists:   Hospital course: 74 year old female with history of DM, HTN, HLD, prior strokes without significant deficit, possible dementia, presented to [facility] ED on 08/03/15 with complaints of dizziness, gait instability and slurred speech which were present when she woke up on 08/03/15 morning. MRI brain showed a small right inferior and posterior temporal ischemic infarction, multiple old strokes. Neurology consulted and admitted for stroke evaluation.   Assessment & Plan:   Acute CVA - Resultant dizziness, gait instability and slurred speech. Speech normalized. Instability and fall risk persists. - MRI brain: Small focus of acute infarction affecting the right lateral and inferior temporal lobe. Numerous areas of chronic infarction. As per neurology, right acute CVA territory infarcts, embolic secondary to unknown source. -  CTA head and neck: No acute arterial finding. No flow-limiting stenosis. - 2-D echo: Unremarkable. Results as below. - Lower extremity venous Dopplers: Negative for DVT. As per neurology, TEE to look for embolic source and arranged for 08/06/15.  - LDL: 49 - Hemoglobin A1c; 5.7. - Noncompliant with aspirin prior to admission. Now on aspirin 325 MG daily for secondary stroke prophylaxis. - PT recommends CIR consul. Rehabilitation declined and recommends SNF. - Neurology consultation appreciated.  Essential hypertension - Allow for permissive hypertension. Hold irbesartan and Diovan for now.  Type II DM - Hemoglobin A1: 5.7. - Patient apparently noncompliant with home oral medications. - SSI.  Pyuria - Urine microscopy shows few bacteria, large leukocytes but negative nitrites. No reported dysuria or urinary frequency or fevers. No antibiotics for now.  Hyperlipidemia - LDL 49, goal <70  - Continue statins.  Possible dementia - Possibly  slightly confused and may be baseline.  DVT prophylaxis: Lovenox  Code Status: Full  Family Communication: None at bedside  Disposition Plan: to be determined . SNF versus home with spouse and therapies. Possible discharge 3/1 pending completion of stroke workup   Consultants:  Neurology  Rehabilitation M.D.  Procedures:  2-D echo 08/04/15: Study Conclusions  - Left ventricle: The cavity size was normal. Wall thickness was normal. Systolic function was normal. The estimated ejection fraction was in the range of 55% to 60%. Wall motion was normal; there were no regional wall motion abnormalities. Left ventricular diastolic function parameters were normal. - Aortic valve: There was trivial regurgitation. - Mitral valve: There was mild to moderate regurgitation.  Bilateral lower extremity venous Dopplers 08/04/15: Summary:  - No evidence of deep vein thrombosis involving the right lower  extremity. - No evidence of deep vein thrombosis involving the left lower  extremity.  Antimicrobials:  None   Subjective:  slightly confused. Wants to know when she can go home. As per RN, no acute issues.   Objective: Filed Vitals:   08/04/15 2207 08/05/15 0153 08/05/15 0600 08/05/15 0934  BP: 121/79 121/67 130/71 117/67  Pulse: 68 66 75 81  Temp: 98.5 F (36.9 C) 98.4 F (36.9 C) 97.9 F (36.6 C) 97.4 F (36.3 C)  TempSrc: Oral Oral Oral Oral  Resp: 18 18 18 16   Height:      Weight:      SpO2: 97% 96% 97% 97%    Intake/Output Summary (Last 24 hours) at 08/05/15 1209 Last data filed at 08/05/15 0934  Gross per 24 hour  Intake    241 ml  Output      0 ml  Net    241  ml   Filed Weights   08/03/15 1125  Weight: 64.411 kg (142 lb)    Exam:  General exam: Moderately built and frail elderly female sitting comfortably in chair this morning.  Respiratory system: Clear. No increased work of breathing. Cardiovascular system: S1 & S2 heard, RRR. No JVD, murmurs,  gallops, clicks or pedal edema.Telemetry: Sinus rhythm.  Gastrointestinal system: Abdomen is nondistended, soft and nontender. Normal bowel sounds heard. Central nervous system: Alert and oriented to person and place. No focal neurological deficits. Extremities: Symmetric 5 x 5 power.   Data Reviewed: Basic Metabolic Panel:  Recent Labs Lab 08/03/15 1406 08/03/15 1808 08/04/15 0746  NA 147*  --  144  K 4.5  --  4.0  CL 112*  --  105  CO2 25  --  27  GLUCOSE 78  --  98  BUN 14  --  12  CREATININE 1.25* 1.19* 1.18*  CALCIUM 10.0  --  10.0   Liver Function Tests: No results for input(s): AST, ALT, ALKPHOS, BILITOT, PROT, ALBUMIN in the last 168 hours. No results for input(s): LIPASE, AMYLASE in the last 168 hours. No results for input(s): AMMONIA in the last 168 hours. CBC:  Recent Labs Lab 08/03/15 1202 08/03/15 1808 08/04/15 0746  WBC 6.6 5.7 4.8  HGB 12.9 11.2* 12.6  HCT 40.9 35.7* 40.2  MCV 93.4 91.5 91.6  PLT 188 191 197   Cardiac Enzymes: No results for input(s): CKTOTAL, CKMB, CKMBINDEX, TROPONINI in the last 168 hours. BNP (last 3 results) No results for input(s): PROBNP in the last 8760 hours. CBG:  Recent Labs Lab 08/03/15 1131 08/04/15 1642 08/05/15 0633 08/05/15 1139  GLUCAP 95 93 80 88    Recent Results (from the past 240 hour(s))  Urine culture     Status: None   Collection Time: 08/03/15  2:16 PM  Result Value Ref Range Status   Specimen Description URINE, CATHETERIZED  Final   Special Requests NONE  Final   Culture MULTIPLE SPECIES PRESENT, SUGGEST RECOLLECTION  Final   Report Status 08/04/2015 FINAL  Final         Studies: Ct Angio Head W/cm &/or Wo Cm  08/04/2015  CLINICAL DATA:  Stroke. EXAM: CT ANGIOGRAPHY HEAD AND NECK TECHNIQUE: Multidetector CT imaging of the head and neck was performed using the standard protocol during bolus administration of intravenous contrast. Multiplanar CT image reconstructions and MIPs were obtained  to evaluate the vascular anatomy. Carotid stenosis measurements (when applicable) are obtained utilizing NASCET criteria, using the distal internal carotid diameter as the denominator. CONTRAST:  50 cc Omnipaque 350 intravenous COMPARISON:  Brain MRI from yesterday FINDINGS: CT HEAD Brain: Extensive chronic ischemic injury with numerous small vessel infarcts throughout the atrophic bilateral cerebellum. Chronic small cortical and white matter infarcts in the right frontal and parasagittal left occipital lobes. The acute right temporal cortex infarct is not visible by CT. No hydrocephalus or hemorrhage. Calvarium and skull base: Negative Paranasal sinuses: Clear where seen Orbits: Bilateral cataract resection.  No acute finding CTA NECK Aortic arch: No visualized aneurysm or dissection. Three vessel branching. Right carotid system: Minimal atheromatous change. No stenosis, dissection, or evidence of plaque ulceration. Left carotid system: Minimal atheromatous change. No stenosis, dissection, or plaque ulceration. Vertebral arteries:Codominant. No stenosis. No evidence of vasculopathy Skeleton: No contributory findings. Spondylosis and facet arthropathy arthropathy. Bulky posterior endplate spurring and ossification at C5-6 with probable cord contact. Other neck: Multi nodular goiter. CTA HEAD Anterior circulation: No stenosis, beading,  or major vessel occlusion. Negative for aneurysm. Posterior circulation: No stenosis, beading, or major vessel occlusion. Negative for aneurysm. Venous sinuses: Patent Anatomic variants: None significant. Intact and balanced circle-of-Willis. Delayed phase: No parenchymal enhancement IMPRESSION: 1. No acute arterial finding.  No flow limiting stenosis. 2. Minimal atherosclerosis for age and in comparison to the degree of advanced intracranial ischemic injury. Electronically Signed   By: Marnee Spring M.D.   On: 08/04/2015 06:54   Dg Chest 2 View  08/03/2015  CLINICAL DATA:   Dizziness EXAM: CHEST  2 VIEW COMPARISON:  03/30/2005 FINDINGS: The heart size and mediastinal contours are within normal limits. Both lungs are clear. The visualized skeletal structures are unremarkable. IMPRESSION: No active cardiopulmonary disease. Electronically Signed   By: Alcide Clever M.D.   On: 08/03/2015 20:29   Ct Angio Neck W/cm &/or Wo/cm  08/04/2015  CLINICAL DATA:  Stroke. EXAM: CT ANGIOGRAPHY HEAD AND NECK TECHNIQUE: Multidetector CT imaging of the head and neck was performed using the standard protocol during bolus administration of intravenous contrast. Multiplanar CT image reconstructions and MIPs were obtained to evaluate the vascular anatomy. Carotid stenosis measurements (when applicable) are obtained utilizing NASCET criteria, using the distal internal carotid diameter as the denominator. CONTRAST:  50 cc Omnipaque 350 intravenous COMPARISON:  Brain MRI from yesterday FINDINGS: CT HEAD Brain: Extensive chronic ischemic injury with numerous small vessel infarcts throughout the atrophic bilateral cerebellum. Chronic small cortical and white matter infarcts in the right frontal and parasagittal left occipital lobes. The acute right temporal cortex infarct is not visible by CT. No hydrocephalus or hemorrhage. Calvarium and skull base: Negative Paranasal sinuses: Clear where seen Orbits: Bilateral cataract resection.  No acute finding CTA NECK Aortic arch: No visualized aneurysm or dissection. Three vessel branching. Right carotid system: Minimal atheromatous change. No stenosis, dissection, or evidence of plaque ulceration. Left carotid system: Minimal atheromatous change. No stenosis, dissection, or plaque ulceration. Vertebral arteries:Codominant. No stenosis. No evidence of vasculopathy Skeleton: No contributory findings. Spondylosis and facet arthropathy arthropathy. Bulky posterior endplate spurring and ossification at C5-6 with probable cord contact. Other neck: Multi nodular goiter. CTA  HEAD Anterior circulation: No stenosis, beading, or major vessel occlusion. Negative for aneurysm. Posterior circulation: No stenosis, beading, or major vessel occlusion. Negative for aneurysm. Venous sinuses: Patent Anatomic variants: None significant. Intact and balanced circle-of-Willis. Delayed phase: No parenchymal enhancement IMPRESSION: 1. No acute arterial finding.  No flow limiting stenosis. 2. Minimal atherosclerosis for age and in comparison to the degree of advanced intracranial ischemic injury. Electronically Signed   By: Marnee Spring M.D.   On: 08/04/2015 06:54   Mr Brain Wo Contrast  08/03/2015  CLINICAL DATA:  Increasing lightheadedness. History of hypertension, diabetes, and hyperlipidemia. Initial encounter. EXAM: MRI HEAD WITHOUT CONTRAST TECHNIQUE: Multiplanar, multiecho pulse sequences of the brain and surrounding structures were obtained without intravenous contrast. COMPARISON:  MR head 02/11/2007. FINDINGS: The patient was unable to remain motionless for the exam. Small or subtle lesions could be overlooked. There is a small focus of restricted diffusion along the RIGHT lateral/inferior margin of the temporal lobe. This is nonhemorrhagic. This corresponds to RIGHT MCA vascular territory involvement. No other areas of restricted diffusion are seen. No acute hemorrhage, mass lesion, or extra-axial hematoma. Generalized atrophy. Hydrocephalus ex vacuo. Extensive chronic microvascular ischemic change. Widespread areas of chronic cerebral and cerebellar infarction, including RIGHT frontal, LEFT parieto-occipital, and BILATERAL cerebellar, without significant chronic hemorrhage. BILATERAL subdural hygromas/ prominence of the extracerebral CSF spaces due to atrophy.  Flow voids are maintained. No midline abnormality. Extracranial soft tissues unremarkable. IMPRESSION: Small focus of acute infarction affecting the RIGHT lateral and inferior temporal lobe. This is nonhemorrhagic. Extensive  atrophy and chronic microvascular ischemic change with numerous areas of chronic infarction. No evidence for large vessel occlusion. Electronically Signed   By: Elsie Stain M.D.   On: 08/03/2015 14:12        Scheduled Meds: . aspirin EC  325 mg Oral Daily  . enoxaparin (LOVENOX) injection  40 mg Subcutaneous Q24H  . insulin aspart  0-9 Units Subcutaneous TID WC  . mirtazapine  15 mg Oral QHS  . rosuvastatin  5 mg Oral Daily   Continuous Infusions:   Principal Problem:   Acute CVA (cerebrovascular accident) (HCC) Active Problems:   Hyperlipidemia   Hypertension   CVA (cerebral infarction)   Diabetes (HCC)   History of TIA (transient ischemic attack)   CKD (chronic kidney disease)    Time spent: 20 minutes.    Marcellus Scott, MD, FACP, FHM. [facility] Pager [phone]  If 7PM-7AM, please contact night-coverage [email or web address] Password [email or web address] 08/05/2015, 12:09 PM    LOS: 2 days

## 2015-08-07 ENCOUNTER — Encounter (HOSPITAL_COMMUNITY): Payer: Self-pay | Admitting: Internal Medicine

## 2015-08-07 LAB — GLUCOSE, CAPILLARY
GLUCOSE-CAPILLARY: 95 mg/dL (ref 65–99)
Glucose-Capillary: 76 mg/dL (ref 65–99)

## 2015-08-07 MED ORDER — ASPIRIN 325 MG PO TBEC: 325.0000 mg | DELAYED_RELEASE_TABLET | Freq: Every day | ORAL | Status: AC

## 2015-08-07 NOTE — Care Management Note (Signed)
Case Management Note  Patient Details  Name: Sherri [name] MRN: [id number] Date of Birth: July 28, 1940  Subjective/Objective:                    Action/Plan: Patient discharging home with [facility] services. CM met with the patient and her husband and provided them a list of [facility] agencies in the [place] area. They selected [facility]. Karolee Stamps with [facility] notified and accepted the referral. Patient also ordered a 3 in 1 and rolling walker. Jermaine with [facility] notified and will deliver the equipment to the room. Bedside RN updated.   Expected Discharge Date:                  Expected Discharge Plan:     In-House Referral:     Discharge planning Services     Post Acute Care Choice:    Choice offered to:     DME Arranged:    DME Agency:     HH Arranged:    HH Agency:     Status of Service:  In process, will continue to follow  Medicare Important Message Given:  Yes Date Medicare IM Given:    Medicare IM give by:    Date Additional Medicare IM Given:    Additional Medicare Important Message give by:     If discussed at [place] of Stay Meetings, dates discussed:    Additional Comments:  Pollie Friar, RN 08/07/2015, 2:26 PM

## 2015-08-07 NOTE — Progress Notes (Signed)
I do not have an inpt rehab bed available for this pt today. I spoke with Spouse by phone and he does not want to pursue inpt rehab at any other facility or SNF. He wishes for dc home with [facility] and a list for resources to arrange private duty aide service as needed when he can not provide supervision for his wife due to work. I have alerted RN CM.We will sign off. [phone]

## 2015-08-07 NOTE — Progress Notes (Signed)
Physical Therapy Treatment Patient Details Name: Sherri [name] MRN: [id number] DOB: 07-09-1940 Today's Date: 08/07/2015    History of Present Illness Sherri [name] is an 75 y.o. female presenting with new onset dizziness with gait difficulty as well as slurred speech. PMHx- hypertension, diabetes mellitus and hyperlipidemia  MRI + small right temporal infarction. CT angiogram + Extensive chronic infarcts bilateral cerebellum, right frontal and parasagittal left occipital lobes.     PT Comments    Noted change in discharge plans. Husband present for education. All questions/concerns answered (or deferred to OT re: tub transfers and ?need for seat). Emphasized need to remove barriers in the home (they have wall to wall carpet except for kitchen and bathroom) and need for assistance. Contacted OT and they plan to see pt today.    Follow Up Recommendations  Home health PT;Supervision/Assistance - 24 hour (CIR not an option, refusing SNF)     Equipment Recommendations  Rolling walker with 5" wheels    Recommendations for Other Services       Precautions / Restrictions Precautions Precautions: Fall Precaution Comments: reports 1 fall in 12 mos; near fall in hospital    Mobility  Bed Mobility                  Transfers Overall transfer level: Needs assistance Equipment used: Rolling walker (2 wheeled) Transfers: Sit to/from Stand Sit to Stand: Min guard         General transfer comment: pt recalled to scoot to edge of chair; subtle cue to place hands on armrests (not RW); close guard as she stood with no posterior lean  Ambulation/Gait Ambulation/Gait assistance: Min guard Ambulation Distance (Feet): 200 Feet Assistive device: Rolling walker (2 wheeled) Gait Pattern/deviations: Step-through pattern;Decreased stride length;Drifts right/left;Trunk flexed   Gait velocity interpretation: Below normal speed for age/gender General Gait Details: see cognitive comments  re: use of RW   Stairs Stairs: Yes Stairs assistance: Min assist Stair Management: No rails;Step to pattern;Forwards (Rt HHA) Number of Stairs: 2 (done twice) General stair comments: pt initially practiced with PT assisting and husband observing; on last step down pt misjudged distance to floor with LOB to rt required use of gait belt to assist; practiced with husband and pt did well  Wheelchair Mobility    Modified Rankin (Stroke Patients Only) Modified Rankin (Stroke Patients Only) Pre-Morbid Rankin Score: No significant disability Modified Rankin: Moderately severe disability     Balance             Standing balance-Leahy Scale: Fair                 High Level Balance Comments: drifts rt and lt when walking in distracting environment;     Cognition Arousal/Alertness: Awake/alert Behavior During Therapy: WFL for tasks assessed/performed Overall Cognitive Status: Impaired/Different from baseline Area of Impairment: Memory;Safety/judgement;Awareness;Attention;Following commands   Current Attention Level: Sustained Memory: Decreased recall of precautions;Decreased short-term memory Following Commands: Follows multi-step commands inconsistently (2 step; due to decr attention and memory) Safety/Judgement: Decreased awareness of safety;Decreased awareness of deficits Awareness: Intellectual Problem Solving: Slow processing;Requires verbal cues;Requires tactile cues;Difficulty sequencing General Comments: Hallways very busy while ambulating today and pt paid poor attention to proximity to RW as she was very distracted (also PT talking to husband while walking). With questioning cue she would immediately correct.     Exercises      General Comments General comments (skin integrity, edema, etc.): Husband present throughout      Pertinent Vitals/Pain  Pain Assessment: No/denies pain    Home Living                      Prior Function            PT  Goals (current goals can now be found in the care plan section) Acute Rehab PT Goals Patient Stated Goal: to not fall and break anything Time For Goal Achievement: 08/11/15 Progress towards PT goals: Progressing toward goals    Frequency  Min 4X/week    PT Plan Discharge plan needs to be updated    Co-evaluation             End of Session Equipment Utilized During Treatment: Gait belt Activity Tolerance: Patient tolerated treatment well Patient left: in chair;with call bell/phone within reach;with chair alarm set;with family/visitor present     Time: 1610-9604 PT Time Calculation (min) (ACUTE ONLY): 30 min  Charges:  $Gait Training: 23-37 mins                    G Codes:      Tayvion Lauder 2015-08-20, 11:51 AM Pager [phone]

## 2015-08-07 NOTE — Clinical Social Work Note (Signed)
CSW received referral for SNF.  Case discussed with case manager, and plan is to discharge home.  CSW to sign off please re-consult if social work needs arise.  Jeron Grahn R. Tashe Purdon, MSW, LCSWA [phone]  

## 2015-08-07 NOTE — Progress Notes (Signed)
Occupational Therapy Treatment Patient Details Name: Sherri [name] MRN: [id number] DOB: January 18, 1941 Today's Date: 08/07/2015    History of present illness Sherri [name] is an 75 y.o. female presenting with new onset dizziness with gait difficulty as well as slurred speech. PMHx- hypertension, diabetes mellitus and hyperlipidemia  MRI + small right temporal infarction. CT angiogram + Extensive chronic infarcts bilateral cerebellum, right frontal and parasagittal left occipital lobes.    OT comments  Patient progressing nicely towards OT goals, continue plan of care for now. Did change d/c recommendations to home with [facility] and 24/7 supervision. Pt and husband refusing post acute rehab at this time. As long as patient has 24/7 think this will be okay.   Follow Up Recommendations  Supervision/Assistance - 24 hour;Home health OT    Equipment Recommendations  3 in 1 bedside comode    Recommendations for Other Services  None at this time  Precautions / Restrictions Precautions Precautions: Fall Precaution Comments: reports 1 fall in 12 mos; near fall in hospital Restrictions Weight Bearing Restrictions: No    Mobility Bed Mobility General bed mobility comments: Pt found seated in recliner upon OT entering/exiting room   Transfers Overall transfer level: Needs assistance Equipment used: Rolling walker (2 wheeled) Transfers: Sit to/from Stand Sit to Stand: Min guard General transfer comment: Min guard for safety using RW    Balance Overall balance assessment: Needs assistance Sitting-balance support: No upper extremity supported;Feet supported Sitting balance-Leahy Scale: Good     Standing balance support: Bilateral upper extremity supported;During functional activity Standing balance-Leahy Scale: Fair Standing balance comment: using RW for safety  High Level Balance Comments: drifts rt and lt when walking in distracting environment;    ADL Overall ADL's : Needs  assistance/impaired General ADL Comments: Pt found seated in recliner. Pt and husband with questions regarding tub/shower transfers. Pt ambulated from room to rehab gym, close to 65M. Educated pt and husband on safe and effective way to get in/out tub/shower using 3-n-1. Pt practiced this safely and husband watched as therapist assisted. Pt min assist for transfer. Pt ambulated back to room at end of session.      Cognition   Behavior During Therapy: WFL for tasks assessed/performed Overall Cognitive Status: Impaired/Different from baseline Area of Impairment: Memory;Safety/judgement;Awareness;Attention;Following commands   Current Attention Level: Sustained Memory: Decreased recall of precautions;Decreased short-term memory  Following Commands: Follows multi-step commands inconsistently Safety/Judgement: Decreased awareness of safety;Decreased awareness of deficits Awareness: Intellectual Problem Solving: Slow processing;Requires verbal cues;Requires tactile cues;Difficulty sequencing General Comments: Pt tended to look down during ambulation and pt required constant reminders. With questioning cues, pt would look straight ahead.                  Pertinent Vitals/ Pain       Pain Assessment: No/denies pain   Frequency Min 2X/week     Progress Toward Goals  OT Goals(current goals can now befound in the care plan section)  Progress towards OT goals: Progressing toward goals  Acute Rehab OT Goals Patient Stated Goal: go home instead of rehab (Husband's goal)-none stated from patient OT Goal Formulation: With family Time For Goal Achievement: 08/19/15 Potential to Achieve Goals: Good  Plan Discharge plan needs to be updated    End of Session Equipment Utilized During Treatment: Rolling walker;Gait belt   Activity Tolerance Patient tolerated treatment well   Patient Left in chair;with call bell/phone within reach;with chair alarm set;with family/visitor present    Time:  9147-8295 OT Time Calculation (  min): 22 min  Charges: OT General Charges $OT Visit: 1 Procedure OT Treatments $Self Care/Home Management : 8-22 mins  Edwin Cap , MS, OTR/L, [place] Pager: [phone]  08/07/2015, 12:38 PM

## 2015-08-07 NOTE — Discharge Summary (Signed)
Physician Discharge Summary  Sherri [name] ZOX:[id number] DOB: Aug 03, 1940 DOA: 08/03/2015  PCP: Ruthe Mannan, MD  Admit date: 08/03/2015 Discharge date: 08/07/2015  Time spent: > 35 minutes  Recommendations for Outpatient Follow-up:  1. Patient should follow-up with neurologist in 2 months or sooner should any new concerns arise 2. Patient refused rehabilitation such once to go home will discharge with home health   Discharge Diagnoses:  Principal Problem:   Acute CVA (cerebrovascular accident) [facility]) Active Problems:   Hyperlipidemia   Hypertension   CVA (cerebral infarction)   Diabetes (HCC)   History of TIA (transient ischemic attack)   CKD (chronic kidney disease)   Discharge Condition: Stable  Diet recommendation: Carb modified diet  Filed Weights   08/03/15 1125  Weight: 64.411 kg (142 lb)    History of present illness:  75 year old female with history of DM, HTN, HLD, prior strokes without significant deficit, possible dementia, presented to [facility] ED on 08/03/15 with complaints of dizziness, gait instability and slurred speech which were present when she woke up on 08/03/15 morning. MRI brain showed a small right inferior and posterior temporal ischemic infarction, multiple old strokes. Neurology consulted and admitted for stroke evaluation.  Hospital Course:  Acute CVA - Resultant dizziness, gait instability and slurred speech. Speech normalized. Instability and fall risk persists. - MRI brain: Small focus of acute infarction affecting the right lateral and inferior temporal lobe. Numerous areas of chronic infarction. As per neurology, right acute CVA territory infarcts, embolic secondary to unknown source. - CTA head and neck: No acute arterial finding. No flow-limiting stenosis. - 2-D echo: Unremarkable. Results as below. - Lower extremity venous Dopplers: Negative for DVT. Patient is status post TEE to look for embolic source which was negative - LDL: 49 - Hemoglobin  A1c; 5.7. - Noncompliant with aspirin prior to admission. Now on aspirin 325 MG daily for secondary stroke prophylaxis. - Rehabilitation declined and patient requesting d/c to home. - Neurology consultation while patient was in house  Essential hypertension - Patient is normotensive off antihypertensive regimen as such will discharge off of blood pressure medication  Type II DM - Hemoglobin A1: 5.7. - Patient apparently noncompliant with home oral medications. - Diabetic diet recommended  Pyuria - Urine microscopy shows few bacteria, large leukocytes but negative nitrites. No reported dysuria or urinary frequency or fevers. No antibiotics for now. - Patient afebrile and white blood cell count within normal limits  Hyperlipidemia - LDL 49, goal <70  - Continue statins.  Possible dementia - Possibly slightly confused and may be baseline   Procedures TEE and Loop recorder implanted   Consultations:  Neurology  Cardiology  Discharge Exam: Filed Vitals:   08/07/15 0527 08/07/15 0956  BP: 120/65 105/65  Pulse: 74 86  Temp: 98.9 F (37.2 C) 98.6 F (37 C)  Resp: 18 20    General: Pt in nad, alert and awake Cardiovascular: rrr, no mrg Respiratory: cta bl, no wheezes  Discharge Instructions   Discharge Instructions    Ambulatory referral to Neurology    Complete by:  As directed   Pt will follow up with Dr. Roda Shutters at [facility] in about 2 months. Thanks.     Call MD for:  extreme fatigue    Complete by:  As directed      Call MD for:  redness, tenderness, or signs of infection (pain, swelling, redness, odor or green/yellow discharge around incision site)    Complete by:  As directed  Call MD for:  temperature >100.4    Complete by:  As directed      Diet - low sodium heart healthy    Complete by:  As directed      Discharge instructions    Complete by:  As directed   Please follow up with Neurologist in 2 months     Increase activity slowly    Complete by:  As  directed           Current Discharge Medication List    START taking these medications   Details  aspirin EC 325 MG EC tablet Take 1 tablet (325 mg total) by mouth daily. Qty: 30 tablet, Refills: 0      CONTINUE these medications which have NOT CHANGED   Details  Glucosamine-Chondroit-Vit C-Mn (GLUCOSAMINE 1500 COMPLEX) CAPS Take 1 capsule by mouth daily.     mirtazapine (REMERON) 15 MG tablet Take 1 tablet (15 mg total) by mouth at bedtime. Qty: 30 tablet, Refills: 3    Omega-3 Fatty Acids (FISH OIL) 1000 MG CAPS Take 1 capsule by mouth daily.      rosuvastatin (CRESTOR) 5 MG tablet Take 1 tablet (5 mg total) by mouth daily. Qty: 90 tablet, Refills: 3      STOP taking these medications     amLODipine (NORVASC) 10 MG tablet      valsartan-hydrochlorothiazide (DIOVAN-HCT) 160-12.5 MG tablet        Allergies  Allergen Reactions  . Hydrogen Peroxide Swelling  . Sulfa Antibiotics    Follow-up Information    Follow up with [facility] On 08/15/2015.   Specialty:  Cardiology   Why:  2:30PM wound check   Contact information:   [address], [address] [place] [address] [phone]      Follow up with Xu,Jindong, MD. Schedule an appointment as soon as possible for a visit in 2 months.   Specialty:  Neurology   Why:  stroke clinic   Contact information:   [address] [place] [address] [phone]        The results of significant diagnostics from this hospitalization (including imaging, microbiology, ancillary and laboratory) are listed below for reference.    Significant Diagnostic Studies: Ct Angio Head W/cm &/or Wo Cm  08/04/2015  CLINICAL DATA:  Stroke. EXAM: CT ANGIOGRAPHY HEAD AND NECK TECHNIQUE: Multidetector CT imaging of the head and neck was performed using the standard protocol during bolus administration of intravenous contrast. Multiplanar CT image reconstructions and MIPs were obtained  to evaluate the vascular anatomy. Carotid stenosis measurements (when applicable) are obtained utilizing NASCET criteria, using the distal internal carotid diameter as the denominator. CONTRAST:  50 cc Omnipaque 350 intravenous COMPARISON:  Brain MRI from yesterday FINDINGS: CT HEAD Brain: Extensive chronic ischemic injury with numerous small vessel infarcts throughout the atrophic bilateral cerebellum. Chronic small cortical and white matter infarcts in the right frontal and parasagittal left occipital lobes. The acute right temporal cortex infarct is not visible by CT. No hydrocephalus or hemorrhage. Calvarium and skull base: Negative Paranasal sinuses: Clear where seen Orbits: Bilateral cataract resection.  No acute finding CTA NECK Aortic arch: No visualized aneurysm or dissection. Three vessel branching. Right carotid system: Minimal atheromatous change. No stenosis, dissection, or evidence of plaque ulceration. Left carotid system: Minimal atheromatous change. No stenosis, dissection, or plaque ulceration. Vertebral arteries:Codominant. No stenosis. No evidence of vasculopathy Skeleton: No contributory findings. Spondylosis and facet arthropathy arthropathy. Bulky posterior endplate  spurring and ossification at C5-6 with probable cord contact. Other neck: Multi nodular goiter. CTA HEAD Anterior circulation: No stenosis, beading, or major vessel occlusion. Negative for aneurysm. Posterior circulation: No stenosis, beading, or major vessel occlusion. Negative for aneurysm. Venous sinuses: Patent Anatomic variants: None significant. Intact and balanced circle-of-Willis. Delayed phase: No parenchymal enhancement IMPRESSION: 1. No acute arterial finding.  No flow limiting stenosis. 2. Minimal atherosclerosis for age and in comparison to the degree of advanced intracranial ischemic injury. Electronically Signed   By: Marnee Spring M.D.   On: 08/04/2015 06:54   Dg Chest 2 View  08/03/2015  CLINICAL DATA:   Dizziness EXAM: CHEST  2 VIEW COMPARISON:  03/30/2005 FINDINGS: The heart size and mediastinal contours are within normal limits. Both lungs are clear. The visualized skeletal structures are unremarkable. IMPRESSION: No active cardiopulmonary disease. Electronically Signed   By: Alcide Clever M.D.   On: 08/03/2015 20:29   Ct Angio Neck W/cm &/or Wo/cm  08/04/2015  CLINICAL DATA:  Stroke. EXAM: CT ANGIOGRAPHY HEAD AND NECK TECHNIQUE: Multidetector CT imaging of the head and neck was performed using the standard protocol during bolus administration of intravenous contrast. Multiplanar CT image reconstructions and MIPs were obtained to evaluate the vascular anatomy. Carotid stenosis measurements (when applicable) are obtained utilizing NASCET criteria, using the distal internal carotid diameter as the denominator. CONTRAST:  50 cc Omnipaque 350 intravenous COMPARISON:  Brain MRI from yesterday FINDINGS: CT HEAD Brain: Extensive chronic ischemic injury with numerous small vessel infarcts throughout the atrophic bilateral cerebellum. Chronic small cortical and white matter infarcts in the right frontal and parasagittal left occipital lobes. The acute right temporal cortex infarct is not visible by CT. No hydrocephalus or hemorrhage. Calvarium and skull base: Negative Paranasal sinuses: Clear where seen Orbits: Bilateral cataract resection.  No acute finding CTA NECK Aortic arch: No visualized aneurysm or dissection. Three vessel branching. Right carotid system: Minimal atheromatous change. No stenosis, dissection, or evidence of plaque ulceration. Left carotid system: Minimal atheromatous change. No stenosis, dissection, or plaque ulceration. Vertebral arteries:Codominant. No stenosis. No evidence of vasculopathy Skeleton: No contributory findings. Spondylosis and facet arthropathy arthropathy. Bulky posterior endplate spurring and ossification at C5-6 with probable cord contact. Other neck: Multi nodular goiter. CTA  HEAD Anterior circulation: No stenosis, beading, or major vessel occlusion. Negative for aneurysm. Posterior circulation: No stenosis, beading, or major vessel occlusion. Negative for aneurysm. Venous sinuses: Patent Anatomic variants: None significant. Intact and balanced circle-of-Willis. Delayed phase: No parenchymal enhancement IMPRESSION: 1. No acute arterial finding.  No flow limiting stenosis. 2. Minimal atherosclerosis for age and in comparison to the degree of advanced intracranial ischemic injury. Electronically Signed   By: Marnee Spring M.D.   On: 08/04/2015 06:54   Mr Brain Wo Contrast  08/03/2015  CLINICAL DATA:  Increasing lightheadedness. History of hypertension, diabetes, and hyperlipidemia. Initial encounter. EXAM: MRI HEAD WITHOUT CONTRAST TECHNIQUE: Multiplanar, multiecho pulse sequences of the brain and surrounding structures were obtained without intravenous contrast. COMPARISON:  MR head 02/11/2007. FINDINGS: The patient was unable to remain motionless for the exam. Small or subtle lesions could be overlooked. There is a small focus of restricted diffusion along the RIGHT lateral/inferior margin of the temporal lobe. This is nonhemorrhagic. This corresponds to RIGHT MCA vascular territory involvement. No other areas of restricted diffusion are seen. No acute hemorrhage, mass lesion, or extra-axial hematoma. Generalized atrophy. Hydrocephalus ex vacuo. Extensive chronic microvascular ischemic change. Widespread areas of chronic cerebral and cerebellar infarction, including RIGHT frontal,  LEFT parieto-occipital, and BILATERAL cerebellar, without significant chronic hemorrhage. BILATERAL subdural hygromas/ prominence of the extracerebral CSF spaces due to atrophy. Flow voids are maintained. No midline abnormality. Extracranial soft tissues unremarkable. IMPRESSION: Small focus of acute infarction affecting the RIGHT lateral and inferior temporal lobe. This is nonhemorrhagic. Extensive  atrophy and chronic microvascular ischemic change with numerous areas of chronic infarction. No evidence for large vessel occlusion. Electronically Signed   By: Elsie Stain M.D.   On: 08/03/2015 14:12    Microbiology: Recent Results (from the past 240 hour(s))  Urine culture     Status: None   Collection Time: 08/03/15  2:16 PM  Result Value Ref Range Status   Specimen Description URINE, CATHETERIZED  Final   Special Requests NONE  Final   Culture MULTIPLE SPECIES PRESENT, SUGGEST RECOLLECTION  Final   Report Status 08/04/2015 FINAL  Final     Labs: Basic Metabolic Panel:  Recent Labs Lab 08/03/15 1406 08/03/15 1808 08/04/15 0746  NA 147*  --  144  K 4.5  --  4.0  CL 112*  --  105  CO2 25  --  27  GLUCOSE 78  --  98  BUN 14  --  12  CREATININE 1.25* 1.19* 1.18*  CALCIUM 10.0  --  10.0   Liver Function Tests: No results for input(s): AST, ALT, ALKPHOS, BILITOT, PROT, ALBUMIN in the last 168 hours. No results for input(s): LIPASE, AMYLASE in the last 168 hours. No results for input(s): AMMONIA in the last 168 hours. CBC:  Recent Labs Lab 08/03/15 1202 08/03/15 1808 08/04/15 0746  WBC 6.6 5.7 4.8  HGB 12.9 11.2* 12.6  HCT 40.9 35.7* 40.2  MCV 93.4 91.5 91.6  PLT 188 191 197   Cardiac Enzymes: No results for input(s): CKTOTAL, CKMB, CKMBINDEX, TROPONINI in the last 168 hours. BNP: BNP (last 3 results) No results for input(s): BNP in the last 8760 hours.  ProBNP (last 3 results) No results for input(s): PROBNP in the last 8760 hours.  CBG:  Recent Labs Lab 08/06/15 1609 08/06/15 1654 08/06/15 2113 08/07/15 0651 08/07/15 1125  GLUCAP 49* 70 82 76 95     Signed:  Penny Pia MD.  [facility] 08/07/2015, 12:48 PM

## 2015-08-07 NOTE — Progress Notes (Signed)
D/C orders received, pt for D/C home today with home health.  IV and telemetry D/C.  Rx and D/C instructions given with verbalized understanding.  Family at bedside to assist with D/C.  Staff brought pt downstairs via wheelchair.  

## 2015-08-07 NOTE — Clinical Documentation Improvement (Signed)
Hospitalist  Can the diagnosis of CKD be further specified?   In some places in record it states CKD and others notes CKD is not mentioned. First noted in progress of DR Maryla Morrow with evaluation of rehab.   CKD Stage I - GFR greater than or equal to 90  CKD Stage II - GFR 60-89  CKD Stage III - GFR 30-59  CKD Stage IV - GFR 15-29  CKD Stage V - GFR < 15  ESRD (End Stage Renal Disease)  Other condition  Unable to clinically determine   Supporting Information: : (risk factors, signs and symptoms, diagnostics, treatment) BUN - 14, 12; creatinine - 1.25, 1.18, GFR 48, 51.  Please exercise your independent, professional judgment when responding. A specific answer is not anticipated or expected.   Thank Modesta Messing [facility] Health Information Management [place] [phone]

## 2015-08-08 ENCOUNTER — Telehealth: Payer: Self-pay | Admitting: *Deleted

## 2015-08-08 ENCOUNTER — Other Ambulatory Visit: Payer: Self-pay | Admitting: *Deleted

## 2015-08-08 DIAGNOSIS — I69393 Ataxia following cerebral infarction: Secondary | ICD-10-CM | POA: Diagnosis not present

## 2015-08-08 DIAGNOSIS — I69322 Dysarthria following cerebral infarction: Secondary | ICD-10-CM | POA: Diagnosis not present

## 2015-08-08 MED ORDER — MIRTAZAPINE 15 MG PO TABS: 15.0000 mg | ORAL_TABLET | Freq: Every day | ORAL | Status: DC

## 2015-08-08 MED ORDER — ROSUVASTATIN CALCIUM 5 MG PO TABS: 5.0000 mg | ORAL_TABLET | Freq: Every day | ORAL | Status: DC

## 2015-08-08 NOTE — Telephone Encounter (Signed)
Transition Care Management Follow-up Telephone Call   Date discharged? 08/07/15   How have you been since you were released from the hospital? Doing well.   Do you understand why you were in the hospital? yes   Do you understand the discharge instructions? yes   Where were you discharged to? Home - patient refused inpatient rehab   Items Reviewed:  Medications reviewed: yes  Allergies reviewed: yes  Dietary changes reviewed: no  Referrals reviewed: yes, cardiology (scheduled), neurology   Functional Questionnaire:   Activities of Daily Living (ADLs):   She states they are independent in the following: ambulation, bathing and hygiene, feeding, continence, grooming, toileting and dressing States they require assistance with the following: none, spouse available if needed   Any transportation issues/concerns?: no   Any patient concerns? no   Confirmed importance and date/time of follow-up visits scheduled yes, 08/13/15 @ 1100  Provider Appointment booked with [name], MD  Confirmed with patient if condition begins to worsen call PCP or go to the ER.  Patient was given the office number and encouraged to call back with question or concerns.  : yes

## 2015-08-08 NOTE — Telephone Encounter (Signed)
pts brother came into office to discuss pts medications. Pt was d/c from ED admittance on 3/2 and BP meds were d/c by Dr Penny Piarlando Vega. Brother requested depression and cholesterol meds be sent to mail order pharmacy. Request only a 90 day supply be sent so that pt is not confused and double dosing when new meds arrive. Brother will contact office back in appx 3mos to renew Rxs.

## 2015-08-09 DIAGNOSIS — I69322 Dysarthria following cerebral infarction: Secondary | ICD-10-CM | POA: Diagnosis not present

## 2015-08-09 DIAGNOSIS — I69393 Ataxia following cerebral infarction: Secondary | ICD-10-CM | POA: Diagnosis not present

## 2015-08-11 ENCOUNTER — Other Ambulatory Visit: Payer: Self-pay

## 2015-08-11 ENCOUNTER — Telehealth: Payer: Self-pay | Admitting: Family Medicine

## 2015-08-11 DIAGNOSIS — I69393 Ataxia following cerebral infarction: Secondary | ICD-10-CM | POA: Diagnosis not present

## 2015-08-11 DIAGNOSIS — I69322 Dysarthria following cerebral infarction: Secondary | ICD-10-CM | POA: Diagnosis not present

## 2015-08-11 NOTE — Telephone Encounter (Signed)
Spoke to DellwoodArnold and provided verbal orders

## 2015-08-11 NOTE — Telephone Encounter (Signed)
Ok to give verbal orders as requested. 

## 2015-08-11 NOTE — Patient Outreach (Addendum)
[facility] Care Management  08/11/2015  [name] 04/05/1941 [id number]  [facility] Stroke Program Red Alert 08/10/15 Day #3 Feeling Worse overall? Yes Patient eligible [facility].   Providers: Primary MD: Dr. [name] -  last appt: 08/11/2015 and next appt:  08/13/2015 Dr. Roda ShuttersXu  Next appt 10/07/2015 [facility]  [facility] Insurance:  Medicare and BCBS  Social: Patient lives in her home with her husband.  States husband helps manage her healthcare needs and gives permission to discuss PHI with husband.  States husband is not at home at this time.   Mobility: Ambulating with cane.  Falls: None  Transportation:  Husband Caregiver: Husband DME: cane, 3n1 ([facility]).   [facility] conditions:  Stroke Program  Admissions: 1  Last admission 08/03/2015 -  08/07/2015  Stroke ER visits: 1 Confirms she is doing ok and answered the question incorrectly.  States good progression since discharge home.  Plans to see Primary MD 08/13/2015.  States husband has noted changes in speech since stroke but she has not noted this to be a problem.  Walking with a cane since her stroke.  H/o Type II DM with A1c: 5.7.  H/o Patient apparently noncompliant with home oral medications.  Medications:  Patient taking less than 10 medications  Co-pay cost issues: no  Flu Vaccine: 03/24/15 PCV13: 03/13/2014 Patient states past issues with medication mail order; states her husband can best explain as he manages medications.    Plan:  Referral Date:  08/11/2015 [facility] Stroke Program:  08/11/2015 Screening 08/11/2015 Telephonic RN CM services:  08/11/2015 Program:  DM 08/11/2015  [facility] Telephonic RN CM Referral  -RN CM will attempt to verify medications with husband on next contact call.  -Diabetes education and management  -continue Stroke Program services.     RN CM notified [facility] Care Management Assistant: agreed to services/case opened. RN CM sent successful outreach letter and  [facility]. RN CM  advised in next scheduled contact call within the next 30 days for Initial assessment and within one week for Stroke Program follow-up.   RN CM advised to please notify MD of any changes in condition prior to scheduled appt's.   RN CM provided contact name and # [phone] or main office # [phone] and 24-hour nurse line # [phone].  RN CM confirmed patient is aware of 911 services for urgent emergency needs.  Donato Schultzrystal Montario Zilka, RN, BSN, [facility], CCM  [facility] Care Management Care Management Coordinator [phone] Direct [phone] Cell [phone] Office [phone] Fax

## 2015-08-11 NOTE — Telephone Encounter (Signed)
Need verbal order for PT  2 week for 2 weeks 1 week for 1 week  cb number is [phone]- [facility]

## 2015-08-13 ENCOUNTER — Ambulatory Visit (INDEPENDENT_AMBULATORY_CARE_PROVIDER_SITE_OTHER): Payer: Medicare Other | Admitting: Family Medicine

## 2015-08-13 VITALS — BP 134/84 | HR 83 | Temp 97.9°F | Wt 147.5 lb

## 2015-08-13 DIAGNOSIS — I10 Essential (primary) hypertension: Secondary | ICD-10-CM

## 2015-08-13 DIAGNOSIS — E785 Hyperlipidemia, unspecified: Secondary | ICD-10-CM

## 2015-08-13 DIAGNOSIS — I639 Cerebral infarction, unspecified: Secondary | ICD-10-CM | POA: Diagnosis not present

## 2015-08-13 DIAGNOSIS — N189 Chronic kidney disease, unspecified: Secondary | ICD-10-CM | POA: Diagnosis not present

## 2015-08-13 DIAGNOSIS — I69393 Ataxia following cerebral infarction: Secondary | ICD-10-CM | POA: Diagnosis not present

## 2015-08-13 DIAGNOSIS — E1159 Type 2 diabetes mellitus with other circulatory complications: Secondary | ICD-10-CM | POA: Diagnosis not present

## 2015-08-13 DIAGNOSIS — I69322 Dysarthria following cerebral infarction: Secondary | ICD-10-CM | POA: Diagnosis not present

## 2015-08-13 NOTE — Progress Notes (Signed)
Pre visit review using our clinic review tool, if applicable. No additional management support is needed unless otherwise documented below in the visit note. 

## 2015-08-13 NOTE — Assessment & Plan Note (Signed)
Normotensive off rx. No changes made today.

## 2015-08-13 NOTE — Progress Notes (Signed)
Subjective:   Patient ID: Sherri [name], female    DOB: [birth year], 75 y.o.   MRN: [id number]  Sherri [name] is a pleasant 75 y.o. year old female who presents to clinic today with her son for Hospitalization Follow-up  on 08/13/2015  HPI:  Admitted to [facility] 2/26- 08/07/2015 after she was taken to ED with dizziness, gait instability and and slurred speech.  Notes reviewed.  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 CT of head and neck- no acute findings. 2 decho unremarkable  Cardiology consulted-  TEE neg for embolic stroke source Loop recorder implanted.  Neuro consulted- speech normalized.   D/c'd home on ASA 325 mg daily and advised follow up with neuro in 2 months.  She declined rehab.  D/c'd home off antihypertensives as she was normotensive off rxs per notes.  LDL 49.  Ct Angio Head W/cm &/or Wo Cm  08/04/2015  CLINICAL DATA:  Stroke. EXAM: CT ANGIOGRAPHY HEAD AND NECK TECHNIQUE: Multidetector CT imaging of the head and neck was performed using the standard protocol during bolus administration of intravenous contrast. Multiplanar CT image reconstructions and MIPs were obtained to evaluate the vascular anatomy. Carotid stenosis measurements (when applicable) are obtained utilizing NASCET criteria, using the distal internal carotid diameter as the denominator. CONTRAST:  50 cc Omnipaque 350 intravenous COMPARISON:  Brain MRI from yesterday FINDINGS: CT HEAD Brain: Extensive chronic ischemic injury with numerous small vessel infarcts throughout the atrophic bilateral cerebellum. Chronic small cortical and white matter infarcts in the right frontal and parasagittal left occipital lobes. The acute right temporal cortex infarct is not visible by CT. No hydrocephalus or hemorrhage. Calvarium and skull base: Negative Paranasal sinuses: Clear where seen Orbits: Bilateral cataract resection.  No acute finding CTA NECK Aortic arch: No  visualized aneurysm or dissection. Three vessel branching. Right carotid system: Minimal atheromatous change. No stenosis, dissection, or evidence of plaque ulceration. Left carotid system: Minimal atheromatous change. No stenosis, dissection, or plaque ulceration. Vertebral arteries:Codominant. No stenosis. No evidence of vasculopathy Skeleton: No contributory findings. Spondylosis and facet arthropathy arthropathy. Bulky posterior endplate spurring and ossification at C5-6 with probable cord contact. Other neck: Multi nodular goiter. CTA HEAD Anterior circulation: No stenosis, beading, or major vessel occlusion. Negative for aneurysm. Posterior circulation: No stenosis, beading, or major vessel occlusion. Negative for aneurysm. Venous sinuses: Patent Anatomic variants: None significant. Intact and balanced circle-of-Willis. Delayed phase: No parenchymal enhancement IMPRESSION: 1. No acute arterial finding.  No flow limiting stenosis. 2. Minimal atherosclerosis for age and in comparison to the degree of advanced intracranial ischemic injury. Electronically Signed   By: Marnee Spring M.D.   On: 08/04/2015 06:54   Dg Chest 2 View  08/03/2015  CLINICAL DATA:  Dizziness EXAM: CHEST  2 VIEW COMPARISON:  03/30/2005 FINDINGS: The heart size and mediastinal contours are within normal limits. Both lungs are clear. The visualized skeletal structures are unremarkable. IMPRESSION: No active cardiopulmonary disease. Electronically Signed   By: Alcide Clever M.D.   On: 08/03/2015 20:29   Ct Angio Neck W/cm &/or Wo/cm  08/04/2015  CLINICAL DATA:  Stroke. EXAM: CT ANGIOGRAPHY HEAD AND NECK TECHNIQUE: Multidetector CT imaging of the head and neck was performed using the standard protocol during bolus administration of intravenous contrast. Multiplanar CT image reconstructions and MIPs were obtained to evaluate the vascular anatomy. Carotid stenosis measurements (when applicable) are obtained utilizing NASCET criteria, using  the distal internal carotid diameter as the denominator. CONTRAST:  50 cc Omnipaque 350 intravenous COMPARISON:  Brain MRI from yesterday FINDINGS: CT HEAD Brain: Extensive chronic ischemic injury with numerous small vessel infarcts throughout the atrophic bilateral cerebellum. Chronic small cortical and white matter infarcts in the right frontal and parasagittal left occipital lobes. The acute right temporal cortex infarct is not visible by CT. No hydrocephalus or hemorrhage. Calvarium and skull base: Negative Paranasal sinuses: Clear where seen Orbits: Bilateral cataract resection.  No acute finding CTA NECK Aortic arch: No visualized aneurysm or dissection. Three vessel branching. Right carotid system: Minimal atheromatous change. No stenosis, dissection, or evidence of plaque ulceration. Left carotid system: Minimal atheromatous change. No stenosis, dissection, or plaque ulceration. Vertebral arteries:Codominant. No stenosis. No evidence of vasculopathy Skeleton: No contributory findings. Spondylosis and facet arthropathy arthropathy. Bulky posterior endplate spurring and ossification at C5-6 with probable cord contact. Other neck: Multi nodular goiter. CTA HEAD Anterior circulation: No stenosis, beading, or major vessel occlusion. Negative for aneurysm. Posterior circulation: No stenosis, beading, or major vessel occlusion. Negative for aneurysm. Venous sinuses: Patent Anatomic variants: None significant. Intact and balanced circle-of-Willis. Delayed phase: No parenchymal enhancement IMPRESSION: 1. No acute arterial finding.  No flow limiting stenosis. 2. Minimal atherosclerosis for age and in comparison to the degree of advanced intracranial ischemic injury. Electronically Signed   By: Marnee SpringJonathon  Watts M.D.   On: 08/04/2015 06:54   Mr Brain Wo Contrast  08/03/2015  CLINICAL DATA:  Increasing lightheadedness. History of hypertension, diabetes, and hyperlipidemia. Initial encounter. EXAM: MRI HEAD WITHOUT  CONTRAST TECHNIQUE: Multiplanar, multiecho pulse sequences of the brain and surrounding structures were obtained without intravenous contrast. COMPARISON:  MR head 02/11/2007. FINDINGS: The patient was unable to remain motionless for the exam. Small or subtle lesions could be overlooked. There is a small focus of restricted diffusion along the RIGHT lateral/inferior margin of the temporal lobe. This is nonhemorrhagic. This corresponds to RIGHT MCA vascular territory involvement. No other areas of restricted diffusion are seen. No acute hemorrhage, mass lesion, or extra-axial hematoma. Generalized atrophy. Hydrocephalus ex vacuo. Extensive chronic microvascular ischemic change. Widespread areas of chronic cerebral and cerebellar infarction, including RIGHT frontal, LEFT parieto-occipital, and BILATERAL cerebellar, without significant chronic hemorrhage. BILATERAL subdural hygromas/ prominence of the extracerebral CSF spaces due to atrophy. Flow voids are maintained. No midline abnormality. Extracranial soft tissues unremarkable. IMPRESSION: Small focus of acute infarction affecting the RIGHT lateral and inferior temporal lobe. This is nonhemorrhagic. Extensive atrophy and chronic microvascular ischemic change with numerous areas of chronic infarction. No evidence for large vessel occlusion. Electronically Signed   By: Elsie StainJohn T Curnes M.D.   On: 08/03/2015 14:12   Lab Results  Component Value Date   LDLCALC 49 08/04/2015   Lab Results  Component Value Date   HGBA1C 5.7* 08/04/2015   Lab Results  Component Value Date   CREATININE 1.18* 08/04/2015   Current Outpatient Prescriptions on File Prior to Visit  Medication Sig Dispense Refill  . aspirin EC 325 MG EC tablet Take 1 tablet (325 mg total) by mouth daily. 30 tablet 0  . Glucosamine-Chondroit-Vit C-Mn (GLUCOSAMINE 1500 COMPLEX) CAPS Take 1 capsule by mouth daily.     . mirtazapine (REMERON) 15 MG tablet Take 1 tablet (15 mg total) by mouth at  bedtime. 90 tablet 0  . Omega-3 Fatty Acids (FISH OIL) 1000 MG CAPS Take 1 capsule by mouth daily.      . rosuvastatin (CRESTOR) 5 MG tablet Take 1 tablet (5 mg total) by mouth daily. 90 tablet 0  No current facility-administered medications on file prior to visit.    Allergies  Allergen Reactions  . Hydrogen Peroxide Swelling  . Sulfa Antibiotics     Past Medical History  Diagnosis Date  . Hyperlipidemia   . Hypertension   . Diabetes mellitus without complication [facility])     Past Surgical History  Procedure Laterality Date  . Ep implantable device N/A 08/06/2015    Procedure: Loop Recorder Insertion;  Surgeon: Hillis Range, MD;  Location: [facility] INVASIVE CV LAB;  Service: Cardiovascular;  Laterality: N/A;  . Tee without cardioversion N/A 08/06/2015    Procedure: TRANSESOPHAGEAL ECHOCARDIOGRAM (TEE);  Surgeon: Lars Masson, MD;  Location: [facility] ENDOSCOPY;  Service: Cardiovascular;  Laterality: N/A;    No family history on file.  Social History   Social History  . Marital Status: Married    Spouse Name: N/A  . Number of Children: N/A  . Years of Education: N/A   Occupational History  . Not on file.   Social History Main Topics  . Smoking status: Never Smoker   . Smokeless tobacco: Never Used  . Alcohol Use: No  . Drug Use: No  . Sexual Activity: Not on file   Other Topics Concern  . Not on file   Social History Narrative   Desires CPR.   Would not want prolonged life support if futile   The PMH, PSH, Social History, Family History, Medications, and allergies have been reviewed in [facility], and have been updated if relevant.   Review of Systems  Constitutional: Negative.   HENT: Negative.   Eyes: Negative.   Respiratory: Negative.   Cardiovascular: Negative.   Gastrointestinal: Negative.   Endocrine: Negative.   Genitourinary: Negative.   Musculoskeletal: Negative.   Skin: Negative.   Neurological: Negative.   Hematological: Negative.   Psychiatric/Behavioral:  Negative.   All other systems reviewed and are negative.      Objective:    BP 134/84 mmHg  Pulse 83  Temp(Src) 97.9 F (36.6 C) (Oral)  Wt 147 lb 8 oz (66.906 kg)  SpO2 97%   Physical Exam  Constitutional: She is oriented to person, place, and time. She appears well-developed and well-nourished. No distress.  HENT:  Head: Normocephalic.  Eyes: Conjunctivae are normal.  Neck: Normal range of motion.  Cardiovascular: Normal rate.   Pulmonary/Chest: Effort normal.  Musculoskeletal: Normal range of motion.  Neurological: She is alert and oriented to person, place, and time.  Walking with cane Decreased grip strength left  Skin: Skin is warm and dry.  Psychiatric: She has a normal mood and affect. Her behavior is normal. Judgment normal.  Nursing note and vitals reviewed.         Assessment & Plan:   Acute CVA (cerebrovascular accident) (HCC)  CKD (chronic kidney disease), unspecified stage  Type 2 diabetes mellitus with other circulatory complication, without long-term current use of insulin (HCC)  Essential hypertension  Hyperlipidemia No Follow-up on file.

## 2015-08-13 NOTE — Assessment & Plan Note (Signed)
a1c at goal. No changes made today.

## 2015-08-13 NOTE — Assessment & Plan Note (Addendum)
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 Appears to be close to baseline.  Loop recorder placed- has follow up scheduled with cardiology on 3/10 for recheck. Continue ASA 325 mg daily and crestor 5 mg daily. Has neuro appt scheduled for 10/07/15.  Refused rehab but has PT/OT home health.

## 2015-08-14 DIAGNOSIS — I69322 Dysarthria following cerebral infarction: Secondary | ICD-10-CM | POA: Diagnosis not present

## 2015-08-14 DIAGNOSIS — I69393 Ataxia following cerebral infarction: Secondary | ICD-10-CM | POA: Diagnosis not present

## 2015-08-14 NOTE — Patient Outreach (Signed)
[facility]) Care Management  08/14/2015  Sherri BirkCreola S [name] 07/30/1940 [id number]   Patient triggered RED on EMMI Stroke dashboard, notification sent to Donato Schultzrystal Hutchinson, RN.  Thanks, Corrie MckusickLisa O. Sharlee BlewMoore, AABA [facility] Care Management [facility] CM Assistant Phone: [phone] Fax: [phone]

## 2015-08-15 ENCOUNTER — Ambulatory Visit: Payer: Medicare Other

## 2015-08-15 ENCOUNTER — Other Ambulatory Visit: Payer: Self-pay

## 2015-08-15 NOTE — Patient Outreach (Signed)
[facility]) Care Management  08/15/2015  Sherri PapasCreola S [name] 08/24/1940 [id number]   [facility]i Stroke Program Red Alert  Day #6  Questions / problems with meds? Yes  Patient states issue resolved.  States she thought she was running out of medication but the SN had filled her medication box and she did not realize this at the time.   Providers: Primary MD: Dr. [name] - last appt: 08/11/2015 and next appt: 08/13/2015 Patient states that MD cancelled this appt.   Dr. Roda ShuttersXu Next appt 10/07/2015 [facility] [place] [facility] [facility] Insurance: Medicare and BCBS   Social: Patient lives in her home with her husband. States husband helps manage her healthcare needs and gives permission to discuss PHI with husband. States husband is not at home at this time.  Mobility: Ambulating with cane.  Falls: None  Transportation: Husband or Son.  Caregiver: Husband DME: cane, 3n1 ([facility]).   [facility] conditions: Stroke Program  Admissions: 1 Last admission 08/03/2015 - 08/07/2015 Stroke ER visits: 1 States good progression since discharge home. H/o States husband has noted changes in speech since stroke but she has not noted this to be a problem. Walking with a cane since her stroke.   Medications:  Patient taking less than 10 medications  Co-pay cost issues: no  Flu Vaccine: 03/24/15 PCV13: 03/13/2014 Patient uses medication box.   Plan:  Referral Date: 08/11/2015 [facility]i Stroke Program: 08/11/2015 Screening 08/11/2015 Telephonic RN CM services: 08/11/2015 Program: DM 08/11/2015  [facility] Telephonic RN CM Referral  -RN CM will continue to attempt to verify medications with husband on next contact call.  -Diabetes education and management  -continue Stroke Program services.    RN CM advised in next scheduled contact call within the next 30 days for Initial assessment and within one week for Stroke Program follow-up.  RN CM advised to please notify MD of any changes in condition prior to  scheduled appt's.  RN CM provided contact name and # [phone] or main office # [phone] and 24-hour nurse line # [phone].  RN CM confirmed patient is aware of 911 services for urgent emergency needs.  Donato Schultzrystal Rowin Bayron, RN, BSN, [facility], CCM  [facility] Care Management Care Management Coordinator [phone] Direct [phone] Cell [phone] Office [phone] Fax

## 2015-08-16 DIAGNOSIS — I69322 Dysarthria following cerebral infarction: Secondary | ICD-10-CM | POA: Diagnosis not present

## 2015-08-16 DIAGNOSIS — I69393 Ataxia following cerebral infarction: Secondary | ICD-10-CM | POA: Diagnosis not present

## 2015-08-18 ENCOUNTER — Other Ambulatory Visit: Payer: Self-pay

## 2015-08-18 ENCOUNTER — Telehealth: Payer: Self-pay | Admitting: Family Medicine

## 2015-08-18 VITALS — BP 155/92

## 2015-08-18 DIAGNOSIS — I69322 Dysarthria following cerebral infarction: Secondary | ICD-10-CM | POA: Diagnosis not present

## 2015-08-18 DIAGNOSIS — I6300 Cerebral infarction due to thrombosis of unspecified precerebral artery: Secondary | ICD-10-CM

## 2015-08-18 DIAGNOSIS — I69393 Ataxia following cerebral infarction: Secondary | ICD-10-CM | POA: Diagnosis not present

## 2015-08-18 NOTE — Telephone Encounter (Signed)
Received a voicemail from a Doctor, general practicepeech Pathologist from [facility] that she was with the patient on Saturday March 11th and that she wanted you to know that she took her BP 3 times and it was high, 155/92. That is all the voicemail said.

## 2015-08-18 NOTE — Patient Outreach (Addendum)
[facility]) Care Management  [facility] Care [facility]  08/18/2015   Elijah BirkCreola S Spatafore 02/18/1941 [id number]   Referral Date:  08/11/2015 [facility]i Stroke Program 08/11/2015 Telephonic RN CM Date:  08/11/2015 - 08/18/2015 Initial Assessment:  08/18/2015  Subjective:   Providers: Primary MD: Dr. [name] - last appt: 08/11/2015 and next appt: 08/13/2015 Patient states that MD cancelled this appt.  Dr. Roda ShuttersXu Next appt 10/07/2015 HH: SN, [place] Insurance: Medicare and BCBS   Social: Patient lives in her home with her husband. States husband helps manage her healthcare needs and gives permission to discuss PHI with husband. States husband is not at home at this time. Mobility: Ambulating with cane.  Falls: None  Transportation: Husband or Son.  Caregiver: Husband DME: cane, 3n1 ([facility]).   Denies home scales or BP cuff in the home).   [facility] conditions: Stroke Program  Admissions: 1 Last admission 08/03/2015 - 08/07/2015 Stroke ER visits: 1 States good progression since discharge home. H/o States husband has noted changes in speech since stroke but she has not noted this to be a problem. Walking with a cane since her stroke.  Patient reports elevated BP over weekend and [facility] to MD 08/18/2015.  States she has a follow up appt around 08/25/15.   Medications:  Patient taking less than 10 medications  Co-pay cost issues: no  Flu Vaccine: 03/24/15 PCV13: 03/13/2014 Patient uses medication box prepared by her husband.    Objective:   Current Medications:  Current Outpatient Prescriptions  Medication Sig Dispense Refill  . aspirin EC 325 MG EC tablet Take 1 tablet (325 mg total) by mouth daily. 30 tablet 0  . Glucosamine-Chondroit-Vit C-Mn (GLUCOSAMINE 1500 COMPLEX) CAPS Take 1 capsule by mouth daily.     . mirtazapine (REMERON) 15 MG tablet Take 1 tablet (15 mg total) by mouth at bedtime. 90 tablet 0  . Omega-3 Fatty Acids (FISH OIL) 1000 MG CAPS  Take 1 capsule by mouth daily.      . rosuvastatin (CRESTOR) 5 MG tablet Take 1 tablet (5 mg total) by mouth daily. 90 tablet 0   No current facility-administered medications for this visit.    Functional Status:  In your present state of health, do you have any difficulty performing the following activities: 08/18/2015 08/03/2015  Hearing? N N  Vision? N Y  Difficulty concentrating or making decisions? Y N  Walking or climbing stairs? Y Y  Dressing or bathing? - Y  Doing errands, shopping? Y -  Quarry managerreparing Food and eating ? N -  Using the Toilet? N -  In the past six months, have you accidently leaked urine? N -  Do you have problems with loss of bowel control? N -  Managing your Medications? Y -  Managing your Finances? Y -  Housekeeping or managing your Housekeeping? Y -    Fall/Depression Screening: PHQ 2/9 Scores 08/18/2015 08/11/2015 03/24/2015  PHQ - 2 Score 0 0 0   Fall Risk  08/18/2015 08/15/2015 08/11/2015 03/24/2015  Falls in the past year? No No No Yes  Number falls in past yr: - - - 1  Injury with Fall? - - - No  Risk for fall due to : Impaired balance/gait;Impaired mobility - Impaired balance/gait;Impaired mobility -    Assessment: Patient will reference her husband, brother, son at different times.  It is unclear to RN CM if she may be referring to the same person.  Patient unable to provide specific details  of care.  Patient has no home scales or BP cuff in the home.   Patient unable to verify medication list.  Patient is able to state she is off her BP medications since her hospital admission.  Patient would benefit from [employer] RN CM home management program to better assess patient and needs.   Plan:  Referral Date: 08/11/2015 Emmi Stroke Program: 08/11/2015 Screening 08/11/2015 Telephonic RN CM services: 08/11/2015 - 08/18/2015 Program: DM 08/11/2015 - 08/18/2015 Initial Assessment 08/18/2015 Barriers Letter and Initial Assessment sent to MD 08/18/2015  [facility] Community RN  CM Referral  - Patient unable to verify or discuss any medications.  Medication review needs to be completed with husband who manages medications.  -HTN education and management  -Diabetes education and management  -Continue Stroke Program services. (Admission date 08/03/2015 - 08/07/2015) -Assess need for possible assistance for home scales and home BP cuff.   -Advance Directives:  Patient unable to address/answer. -Assess further for memory issues.    RN CM advised in next [facility] contact call within the next 10 business days to schedule home visit with community RN CM from [facility].  RN CM advised to please notify MD of any changes in condition prior to scheduled appt's.  RN CM provided contact name and # [phone] or main office # [phone] and 24-hour nurse line # [phone].  RN CM confirmed patient is aware of 911 services for urgent emergency needs.  Donato Schultz, RN, BSN, [facility], CCM  [facility] Management Coordinator [phone] Direct [phone] Cell [phone] Office [phone] Fax

## 2015-08-18 NOTE — Telephone Encounter (Signed)
Her blood pressure medication was d/c'd while she was in the hospital because her blood pressure was normal in the hospital.  It was also normal when she came to see me in 08/13/15.  Please have her come in to see me to recheck and we can restart her BP if her blood pressure remains elevated.  Is she having any symptoms of elevated blood pressure- HA, facial flushing/pressure, CP, SOB, visual changes?

## 2015-08-18 NOTE — Telephone Encounter (Signed)
Please call pt to get more information. 

## 2015-08-18 NOTE — Telephone Encounter (Signed)
Spoke to pt and advised per Dr Aron. F/u appt scheduled 

## 2015-08-18 NOTE — Telephone Encounter (Signed)
Spoke to pt who confirmed elevated BP readings

## 2015-08-19 ENCOUNTER — Telehealth: Payer: Self-pay | Admitting: Family Medicine

## 2015-08-19 DIAGNOSIS — I69311 Memory deficit following cerebral infarction: Secondary | ICD-10-CM | POA: Diagnosis not present

## 2015-08-19 DIAGNOSIS — L129 Pemphigoid, unspecified: Secondary | ICD-10-CM | POA: Diagnosis not present

## 2015-08-19 DIAGNOSIS — I69393 Ataxia following cerebral infarction: Secondary | ICD-10-CM | POA: Diagnosis not present

## 2015-08-19 DIAGNOSIS — I69322 Dysarthria following cerebral infarction: Secondary | ICD-10-CM | POA: Diagnosis not present

## 2015-08-19 NOTE — Telephone Encounter (Signed)
Ok to provide d/c order as requested.

## 2015-08-19 NOTE — Telephone Encounter (Signed)
Spoke to Sherri [name] and d/c home health orders

## 2015-08-19 NOTE — Telephone Encounter (Signed)
Kim called to get an order for speech therapy for expressive aphasia

## 2015-08-19 NOTE — Telephone Encounter (Signed)
Home health care called - Sherri [name]  Pt does not want home health aid any more, needs order to d/c  cb number is [phone]

## 2015-08-19 NOTE — Telephone Encounter (Signed)
Ok to give verbal order as requested. 

## 2015-08-19 NOTE — Telephone Encounter (Signed)
Spoke to SpadeBetty and provided verbal orders

## 2015-08-21 ENCOUNTER — Ambulatory Visit: Payer: Medicare Other

## 2015-08-21 DIAGNOSIS — I69322 Dysarthria following cerebral infarction: Secondary | ICD-10-CM | POA: Diagnosis not present

## 2015-08-21 DIAGNOSIS — I69393 Ataxia following cerebral infarction: Secondary | ICD-10-CM | POA: Diagnosis not present

## 2015-08-21 NOTE — Patient Outreach (Signed)
[facility]) Care Management  08/21/2015  Sherri BirkCreola S [name] 01/27/1941 [id number]   Patient triggered RED on EMMI Stroke dashboard, notification sent to Jodi Mourningose Pierzchala, RN.  Thanks, Corrie MckusickLisa O. Sharlee BlewMoore, AABA [facility] Care Management [facility] CM Assistant Phone: [phone] Fax: [phone]

## 2015-08-22 ENCOUNTER — Other Ambulatory Visit: Payer: Self-pay | Admitting: *Deleted

## 2015-08-22 ENCOUNTER — Telehealth: Payer: Self-pay | Admitting: Family Medicine

## 2015-08-22 DIAGNOSIS — I69322 Dysarthria following cerebral infarction: Secondary | ICD-10-CM | POA: Diagnosis not present

## 2015-08-22 DIAGNOSIS — I69393 Ataxia following cerebral infarction: Secondary | ICD-10-CM | POA: Diagnosis not present

## 2015-08-22 NOTE — Telephone Encounter (Signed)
Ok to give verbal order as requested. 

## 2015-08-22 NOTE — Patient Outreach (Signed)
[facility] Care Management [facility] telephone outreach/ Emmi Stroke Program Red Alert notification 08/22/2015  Sherri BirkCreola S [name] 12/15/1940 [id number]  Received Routine [facility] Community CM [facility], and also notified that [facility]  had red flag that patient had indicated problems with refilling her medications.  Successful telephone outreach to patient, who did not wish to verify her unique patient identifiers for HIPPA; when I told the patient I was a [facility] with [facility], she stated that she "had 5" of her medications "left."  I explained to Sherri [name] the purpose of verification of her patient identifiers, as [facility] has not yet received written consent from the patient, but she did not wish to verify.  [name] stated that she wished for me to speak to her husband instead.  Sherri [name] came to the phone and verified his wife's HIPPA accurately.  I explained the purpose of my call to check on Sherri [name]'s medications as [facility] had received a notification that she had relayed that she had a problem re-filling her prescriptions.  [name] stated that he "was not sure what was going on with her medicines; the pills she gets are pre-filled by the pharmacy, and she keeps moving the dispenser where I can't find it; she is keeping me out of the loop with her medicines.  I believe she is taking them the way she is supposed to, but I am not sure.  I just really don't know how many she has left, because she hides them."    I had noticed that the patient has a scheduled appointment with her PCP on Monday 08/25/15, and asked [name] if they were planning to go to the appointment.  At first, he did not remember the appointment, but when he checked, it was on his calendar, and he stated they would go to the appointment together.  [name] stated that he "believes she has enough medicine to last through the weekend."  [name] was unable to determine  specifically which medication Sherri [name] was referring to when she told EMMI-Stroke she was about to run out of, again stating, "she isn't letting me be involved in her medicines."   Because [name] was unwilling to speak with me during this call, and because Sherri [name] was unsure what Sherri [name]'s prescriptions needs were, I contracted with Sherri [name] that I would send message to patient's PCP, with whom she is scheduled to see on Monday August 25, 2015, to let her know there is question about her prescriptions/ refill status of her medications.  To note, it appears from  EPIC review that the primary purpose of the PCP appointment is for follow up on patient's need for BP medication, and that Dr. [name] has previously counseled the patient on her medications.  I sent a high-priority in-basket message to Dr. [name] and her NP, [name], to notify of them of the details of this telephone encounter for their knowledge and follow up with the patient.  Dr. [name] immediately responded back that she is aware of the ongoing issues Sherri [name] has around her medications.  I also discussed with Sherri [name] that I would like to schedule a [facility] Community CM home visit to assess the patient and assist with care coordination needs, to which he was very receptive.  Sherri [name] stated that they had an appointment with "Adult Protective Services" next week, but shared that he "did not know what that  meeting was all about."  Plan: [facility] Community CM Home visit planned for Thursday August 28, 2015 at 11:00 am  Caryl Pina, [place], BSN, [employer] Coordinator [facility] Care Management  [phone]

## 2015-08-22 NOTE — Telephone Encounter (Signed)
They would like to get a verbal ok to discharge this patient from receiving the OT. Patient is grwing more confused and refusing treatment sessions. Please call Mya back with the verbal OK at [phone]

## 2015-08-25 ENCOUNTER — Ambulatory Visit (INDEPENDENT_AMBULATORY_CARE_PROVIDER_SITE_OTHER): Payer: Medicare Other | Admitting: Family Medicine

## 2015-08-25 ENCOUNTER — Encounter: Payer: Self-pay | Admitting: Family Medicine

## 2015-08-25 VITALS — BP 152/74 | HR 70 | Temp 97.7°F | Wt 144.5 lb

## 2015-08-25 DIAGNOSIS — I10 Essential (primary) hypertension: Secondary | ICD-10-CM | POA: Diagnosis not present

## 2015-08-25 DIAGNOSIS — I639 Cerebral infarction, unspecified: Secondary | ICD-10-CM | POA: Diagnosis not present

## 2015-08-25 MED ORDER — AMLODIPINE BESYLATE 10 MG PO TABS: 10.0000 mg | ORAL_TABLET | Freq: Every day | ORAL | Status: DC

## 2015-08-25 NOTE — Progress Notes (Addendum)
Subjective:   Patient ID: Sherri [name], female    DOB: 01/10/1941, 75 y.o.   MRN: [id number]  Sherri [name] is a pleasant 75 y.o. year old female who presents to clinic today with her son for Follow-up; Dizziness; and watery eyes  on 08/25/2015  HPI:  [facility] after she was taken to ED with dizziness, gait instability and and slurred speech.   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 CT of head and neck- no acute findings. 2 decho unremarkable  D/c'd home off antihypertensives (amlodipine and Valsartan), as she was normotensive off rxs per notes and ASA 325 mg daily added.  She was also normotensive when I saw her at hospital follow up visit on 08/13/15.  Notes again reviewed today. BP has been trending up when checked by OT at home.  She denies HA or blurred vision.  BP Readings from Last 3 Encounters:  08/25/15 152/74  08/18/15 155/92  08/13/15 134/84   States she is not having any issues getting her medications refilled.  She and her son state that she is not refusing OT or ST although there are conflicting notes in the chart.  They both also deny any issues with her husband or other family members.     Lab Results  Component Value Date   LDLCALC 49 08/04/2015   Lab Results  Component Value Date   HGBA1C 5.7* 08/04/2015   Lab Results  Component Value Date   CREATININE 1.18* 08/04/2015   Current Outpatient Prescriptions on File Prior to Visit  Medication Sig Dispense Refill  . aspirin EC 325 MG EC tablet Take 1 tablet (325 mg total) by mouth daily. 30 tablet 0  . Glucosamine-Chondroit-Vit C-Mn (GLUCOSAMINE 1500 COMPLEX) CAPS Take 1 capsule by mouth daily.     . mirtazapine (REMERON) 15 MG tablet Take 1 tablet (15 mg total) by mouth at bedtime. 90 tablet 0  . Omega-3 Fatty Acids (FISH OIL) 1000 MG CAPS Take 1 capsule by mouth daily.      . rosuvastatin (CRESTOR) 5 MG tablet Take 1 tablet (5  mg total) by mouth daily. 90 tablet 0   No current facility-administered medications on file prior to visit.    Allergies  Allergen Reactions  . Hydrogen Peroxide Swelling  . Sulfa Antibiotics     Past Medical History  Diagnosis Date  . Hyperlipidemia   . Hypertension   . Diabetes mellitus without complication [facility](HCC)     Past Surgical History  Procedure Laterality Date  . Ep implantable device N/A 08/06/2015    Procedure: Loop Recorder Insertion;  Surgeon: Hillis RangeJames Allred, MD;  [facility];  Service: Cardiovascular;  Laterality: N/A;  . Tee without cardioversion N/A 08/06/2015    Procedure: TRANSESOPHAGEAL ECHOCARDIOGRAM (TEE);  Surgeon: Lars MassonKatarina H Nelson, MD;  [facility];  Service: Cardiovascular;  Laterality: N/A;    No family history on file.  Social History   Social History  . Marital Status: Married    Spouse Name: N/A  . Number of Children: N/A  . Years of Education: N/A   Occupational History  . Not on file.   Social History Main Topics  . Smoking status: Never Smoker   . Smokeless tobacco: Never Used  . Alcohol Use: No  . Drug Use: No  . Sexual Activity: Not on file   Other Topics Concern  . Not on file   Social History Narrative  Desires CPR.   Would not want prolonged life support if futile   The PMH, PSH, Social History, Family History, Medications, and allergies have been reviewed in [facility], and have been updated if relevant.   Review of Systems  Constitutional: Negative.   HENT: Negative.   Eyes: Negative.   Respiratory: Negative.   Cardiovascular: Negative.   Gastrointestinal: Negative.   Endocrine: Negative.   Genitourinary: Negative.   Musculoskeletal: Negative.   Skin: Negative.   Neurological: Negative.   Hematological: Negative.   Psychiatric/Behavioral: Negative.   All other systems reviewed and are negative.      Objective:    BP 152/74 mmHg  Pulse 70  Temp(Src) 97.7 F (36.5 C) (Oral)  Wt 144 lb 8  oz (65.545 kg)  SpO2 97%   Physical Exam  Constitutional: She is oriented to person, place, and time. She appears well-developed and well-nourished. No distress.  HENT:  Head: Normocephalic.  Eyes: Conjunctivae are normal.  Neck: Normal range of motion.  Cardiovascular: Normal rate.   Pulmonary/Chest: Effort normal.  Musculoskeletal: Normal range of motion.  Neurological: She is alert and oriented to person, place, and time.  Walking with cane Decreased grip strength left  Skin: Skin is warm and dry.  Psychiatric: She has a normal mood and affect. Her behavior is normal. Judgment normal.  Nursing note and vitals reviewed.         Assessment & Plan:   Essential hypertension  Acute CVA (cerebrovascular accident) (HCC) No Follow-up on file.

## 2015-08-25 NOTE — Progress Notes (Signed)
Pre visit review using our clinic review tool, if applicable. No additional management support is needed unless otherwise documented below in the visit note. 

## 2015-08-25 NOTE — Patient Instructions (Addendum)
Great to see you. We are re starting Amlodipine 10 mg daily.  Please come seem in 2 weeks.

## 2015-08-25 NOTE — Assessment & Plan Note (Signed)
Deteriorated. >25 minutes spent in face to face time with patient and her son again reviewing meds today, >50% spent in counselling or coordination of care. Restart amlodipine 10 mg daily. Follow up in 2 weeks. The patient indicates understanding of these issues and agrees with the plan.

## 2015-08-25 NOTE — Telephone Encounter (Signed)
Spoke to Mya and provided verbal orders, as requested

## 2015-08-26 DIAGNOSIS — I69322 Dysarthria following cerebral infarction: Secondary | ICD-10-CM | POA: Diagnosis not present

## 2015-08-26 DIAGNOSIS — I69393 Ataxia following cerebral infarction: Secondary | ICD-10-CM | POA: Diagnosis not present

## 2015-08-28 ENCOUNTER — Encounter: Payer: Self-pay | Admitting: *Deleted

## 2015-08-28 ENCOUNTER — Other Ambulatory Visit: Payer: Self-pay | Admitting: *Deleted

## 2015-08-28 DIAGNOSIS — I69393 Ataxia following cerebral infarction: Secondary | ICD-10-CM | POA: Diagnosis not present

## 2015-08-28 DIAGNOSIS — I69322 Dysarthria following cerebral infarction: Secondary | ICD-10-CM | POA: Diagnosis not present

## 2015-08-28 NOTE — Patient Outreach (Signed)
[facility]) Care Management   08/28/2015  Kimiyah Blick Baldridge [birth year] [id number]  SHOSHANAH DAPPER is an 75 y.o. female followed by [facility] Community CM on referral from [facility] telephonic CM, and Emmi Stroke Program.  Emmi Stroke Program Red Alert had red flag notification numerous times that patient had indicated problems with refilling her medications, to which Dr. Dayton Martes addressed during the patient's last OV.  At that time Mrs. Randleman was started on Norvasc10 mg QD.  Written consent was obtained today for [facility] Community CM involvement in her care.  Today, Mrs. Waszak [facility] nurse through [place], "Kathie Rhodes," was present during my home visit.  Mr. Brossart was not present in the home, although he had contracted with me when scheduling the appointment that he would be there.  Mrs. Sanville reports, "he went bowling."  The [facility] RN, Kathie Rhodes, reported that she has been unable to meet Mr. Mcnall since she has been visiting her, and states that [facility] "only has one week left to visit and fill these pill boxes for her."  During our medication reconciliation, I noticed that Mrs. Blakeney's Remeron was not available for filling of the pill box, and Kathie Rhodes reported that "it has not been with her medicines since I started filling her pill boxes for her."  Kathie Rhodes and I [facility] and determined that Mrs. Makki last filled her Remeron prescription on July 04, 2015, for a 30-day supply.  When questioned about this medication, Mrs. Vogan was unable to answer if she had been taking the medicine or not.  Mrs. Andringa commented several times that she "thought it was in the bedroom," but would not go and look for the medication.  When [place] and I asked her about the information provided [facility] that the medication had not been refilled since July 04, 2015, Mrs. Bose did not offer a response.    Kathie Rhodes and I discussed with Mrs. Picado the importance of taking all of her medications as prescribed,  and letting her doctor know if she is not taking her medications as they are ordered. I attempted to place a phone call to Mr. Circle to coordinate a visit with myself and the [facility] RN, to teach him or another family member how to fill Mrs. Rallis's pill box, as next week will be the last week that [facility] will complete the pill box re-fills.  I left a VM msg with Mr. Yokoyama asking him to return my call to coordinate this visit.  Kathie Rhodes contracted with me that she would call me prior to scheduling her [facility] visit next week, also stating that she too would attempt to reach Mr. Holbein to set up a joint visit for this purpose.  Today, we also discussed the patients support system within her family; Mrs. Zanders had some difficulty verbalizing whom she thinks would be the "best person," to help her with filling of her pill box, and eventually said, "either my husband or my son."    Mrs. Marines stated that she does not check her weights nor BP at home, stating, "I don't want to."  She offered conflicting reports during the visit as to whether or not she had scales in the home or a BP cuff.  She took me to her bathroom, and no scales were visualized.  I discussed with Mrs. Disney the importance of weights and regular BP monitoring, but she adamantly states that she wishes to neither weigh herself regularly or check her BP at  home.  We discussed dietary limitation of salt as one way to improve her high blood pressure, and Mrs. Gubler said that she would "try" to start limiting her salt intake.  We discussed whether or not Mrs. Khalsa had issues with DM, as I noted that she was not on medication for DM.  The patient stated that she was not sure of whether or not she has Diabetes.  I noted her last HgbA1C result from her hospital visit on August 04, 2015 as 5.7; her CBG readings were consistent during her last hospital visit, with a general range noted from 70-90's with one isolated value of 49.  I questioned Mrs.  Tschida about the appointment she had with Adult Protective Services yesterday, and she confirmed that "someone came by," but was unable to verbalize the purpose of their visit.  We also discussed Advanced Directives, and I encouraged Mrs. Nasworthy to review this information with her family, as she expressed that she needs the assistance of family members to understand what she should do.  Subjective: "I want to do whatever I can to stay healthy and out of the hospital."  Objective:    BP 122/72 mmHg  Pulse 98  Resp 16  SpO2 98%  Review of Systems  Constitutional: Negative.   HENT: Negative.   Eyes: Negative.   Respiratory: Negative.   Cardiovascular: Negative.   Gastrointestinal: Negative.   Genitourinary: Negative.   Musculoskeletal: Negative.   Skin: Negative.   Neurological: Negative.   Psychiatric/Behavioral: Positive for memory loss. Negative for depression. The patient is not nervous/anxious.     Physical Exam  Constitutional: She is oriented to person, place, and time. She appears well-developed and well-nourished.  Cardiovascular: Normal rate, regular rhythm and normal heart sounds.   Respiratory: Effort normal and breath sounds normal.  GI: Soft. Bowel sounds are normal.  Musculoskeletal: She exhibits no edema.  Neurological: She is alert and oriented to person, place, and time.  Although patient is alert, and can answer all orientation questions accurately, she reports conflicting information and responses throughout our home visit today.  She also does not accurately answer specific questions around her medication reconciliation.  Skin: Skin is warm and dry.  Psychiatric: She has a normal mood and affect. Her behavior is normal. Cognition and memory are impaired. She exhibits abnormal recent memory.    Current Medications:   Current Outpatient Prescriptions  Medication Sig Dispense Refill  . amLODipine (NORVASC) 10 MG tablet Take 1 tablet (10 mg total) by mouth  daily. 90 tablet 1  . aspirin EC 325 MG EC tablet Take 1 tablet (325 mg total) by mouth daily. 30 tablet 0  . Glucosamine-Chondroit-Vit C-Mn (GLUCOSAMINE 1500 COMPLEX) CAPS Take 1 capsule by mouth daily.     . Omega-3 Fatty Acids (FISH OIL) 1000 MG CAPS Take 1 capsule by mouth daily.      . rosuvastatin (CRESTOR) 5 MG tablet Take 1 tablet (5 mg total) by mouth daily. 90 tablet 0  . mirtazapine (REMERON) 15 MG tablet Take 1 tablet (15 mg total) by mouth at bedtime. 90 tablet 0   No current facility-administered medications for this visit.    Functional Status:   In your present state of health, do you have any difficulty performing the following activities: 08/28/2015 08/18/2015  Hearing? N N  Vision? N N  Difficulty concentrating or making decisions? Malvin Johns  Walking or climbing stairs? Y Y  Dressing or bathing? N -  Doing errands, shopping? Malvin Johns  Preparing Food and eating ? N N  Using the Toilet? N N  In the past six months, have you accidently leaked urine? N N  Do you have problems with loss of bowel control? N N  Managing your Medications? Y Y  Managing your Finances? Malvin JohnsY Y  Housekeeping or managing your Housekeeping? Malvin JohnsY Y    Fall/Depression Screening:    PHQ 2/9 Scores 08/28/2015 08/18/2015 08/11/2015 03/24/2015  PHQ - 2 Score 0 0 0 0    Assessment:    Mrs. Maney's BP reading today was normal, and she stated that she has been feeling good and taking her new BP medicine, which Dr. [name] prescribed for her during their last visit together on August 25, 2015.  HH RN involvement will be ending soon, and there is an issue around how her pill boxes will be refilled once [facility]-filling her pill box.   Plan:  HH RN will re-attempt to contact [name] to schedule time with her and myself to teach him how to re-fill Mrs. Villafranca's pill box, and Kathie RhodesBetty, [facility] RN, will call me to schedule the appointment between the 3 of us. If we are unable to schedule a time with the patient's  husband to teach filling of her pill box, I will reach out to the patient's son to determine his availability to assist with re-filling Mrs. Radu's pill box. Mrs. [name] will keep all scheduled provider appointments. I will communicate [facility] involvement in Mrs. Maheu's care to Dr. [name] by in-basket messaging and make her aware that patient's Remeron has not been filled since late January 2017. Mrs. [name] will provide her husband the note I left for him at their home regarding [facility]. Mrs. [name] will discuss Advance Directive planning with her family. Next scheduled [facility] CM home visit on September 24, 2015; will make [facility] to assist with medication management and family involvement in re-filling pill box education, as allowed.  I appreciate the opportunity to participate in this patent's care,  [name], RN, BSN, CCRN [facility]umnus [facility] Coordinator [facility] Care Management  [phone]

## 2015-08-29 ENCOUNTER — Telehealth: Payer: Self-pay

## 2015-08-29 NOTE — Telephone Encounter (Signed)
Sherri [name] Speech therapist [facility] left v/m as FYI for Dr [name]; Sherri [name] was going to see pt today but was advised by pts husband that they are out of town. Sherri [name] will see pt on 09/01/15.

## 2015-09-01 ENCOUNTER — Ambulatory Visit (INDEPENDENT_AMBULATORY_CARE_PROVIDER_SITE_OTHER): Payer: Medicare Other | Admitting: *Deleted

## 2015-09-01 ENCOUNTER — Encounter: Payer: Self-pay | Admitting: Internal Medicine

## 2015-09-01 DIAGNOSIS — I69322 Dysarthria following cerebral infarction: Secondary | ICD-10-CM | POA: Diagnosis not present

## 2015-09-01 DIAGNOSIS — I69393 Ataxia following cerebral infarction: Secondary | ICD-10-CM | POA: Diagnosis not present

## 2015-09-01 DIAGNOSIS — I639 Cerebral infarction, unspecified: Secondary | ICD-10-CM

## 2015-09-01 LAB — CUP PACEART INCLINIC DEVICE CHECK: Date Time Interrogation Session: 20170327105717

## 2015-09-01 NOTE — Telephone Encounter (Signed)
Noted! Thank you

## 2015-09-01 NOTE — Progress Notes (Signed)
ILR wound check appointment. Steri-strips removed. Wound without redness or edema. Incision edges approximated, wound well healed. Battery status: good. R-waves 1.3018mV. No symptom, tachy, or AF episodes. Pause and brady detection off. Monthly summary reports and ROV with JA PRN.

## 2015-09-02 ENCOUNTER — Other Ambulatory Visit: Payer: Self-pay | Admitting: *Deleted

## 2015-09-02 DIAGNOSIS — I69322 Dysarthria following cerebral infarction: Secondary | ICD-10-CM | POA: Diagnosis not present

## 2015-09-02 DIAGNOSIS — I69393 Ataxia following cerebral infarction: Secondary | ICD-10-CM | POA: Diagnosis not present

## 2015-09-02 NOTE — Addendum Note (Signed)
Addended by: Michaela CornerUSEY, LAINE M on: 09/02/2015 04:17 PM   Modules accepted: Medications

## 2015-09-02 NOTE — Patient Outreach (Addendum)
[facility]) Care Management  09/02/2015  Sherri [name] Dec 18, 1940 [id number]   Received phone call from [place] that she was going to patient's home this afternoon to meet Mr. Peloso and teach him how to refill Mrs. Hunton's pill box, as this is the last week that [facility] will be performing her pill box re-fill. Mrs. Hopkins husband is a primary caregiver for Mrs. Pinney, who is unable to manage her medications independently, and Mr. Riggenbach had previously reported that he would like instruction on re-filling her pill box for her.  In an effort to help Mrs. Mkrtchyan and her husband with independent self health management and to avoid ED visit or hospitalization, I met with Ronnie Derby, RN, [facility], and the Dolman's today to provide education and strategize about medication management. Collaborative interventions today included medication reconciliation, patient/caregiver education, visit strategy with [facility] to ensure patient follow up after [facility] discharge, and long term planning for medication adherence.   Inez Catalina, [place] and I met at the [facility] residence at 1:45 pm, and Mr. Mapel was present.   Mrs. Soldo reports that she has been taking her medications as prescribed from her pre-filled pill box. Together, Inez Catalina and I went through each of Mrs. Pepperman's medications with both Mr. and Mrs. Saxe, including the purpose and schedule of each medication.  Mr. Empson verbalized that he understood how to complete the re-filling of her pill box, and was able to accurately demonstrate how to re-fill her pill box each week.  While working with the [employer], we realized that [facility] a previously discontinued medication, Valsartin 160 mg, from a provider not currently on Mrs. Ormiston's active provider list.  [facility] contacted [facility], and spoke to "Marjory Lies," who confirmed that this medication should not have been refilled and was not on Mrs. Strada's list of active  medications.  Inez Catalina and I instructed the Chaisson's to NOT take the valsartin, and Mr. Golding stated that he would take the [facility] this afternoon.  A thorough medication reconciliation for Mrs. Emory was performed with both of the Agresti's during this short home visit.  We also confirmed that Mrs. Julin's Remeron had been re-filled, and was present to add to her pill box this week.  Additionally, I discussed the importance and advantages of Mrs. Louque having her BP checked at home, although she reported during our last home visit that she did not wish to do so.  Mr. Dobie reported that he has a BP cuff at the home, and said that he would start checking her BP and recording the values.  I encouraged Mr. Warnke to take any BP readings that he has recorded to the upcoming appointment Mrs. Dyal has with her PCP (OV scheduled for Monday 09/08/15 with Dr. Deborra Medina).  Plan: Mr. Kirkeby will re-fill Mrs. Boullion's pill box each week, starting next week. Mr. Chatwin will [facility] the [facility]in [facility] this afternoon. I will route this note to Dr. Deborra Medina, Mrs. Lucking's PCP as an Juluis Rainier (OV scheduled for Monday 09/08/15). Mrs. Piggott will keep all upcoming provider appointments. Next [facility] home visit planned for Wednesday September 24, 2015 at 10:30 am.  Oneta Rack, RN, BSN, [place] Care Management  [phone]

## 2015-09-05 ENCOUNTER — Ambulatory Visit (INDEPENDENT_AMBULATORY_CARE_PROVIDER_SITE_OTHER): Payer: Medicare Other | Admitting: *Deleted

## 2015-09-05 DIAGNOSIS — I639 Cerebral infarction, unspecified: Secondary | ICD-10-CM

## 2015-09-06 DIAGNOSIS — I69322 Dysarthria following cerebral infarction: Secondary | ICD-10-CM | POA: Diagnosis not present

## 2015-09-06 DIAGNOSIS — I69393 Ataxia following cerebral infarction: Secondary | ICD-10-CM | POA: Diagnosis not present

## 2015-09-08 ENCOUNTER — Encounter: Payer: Self-pay | Admitting: Family Medicine

## 2015-09-08 ENCOUNTER — Ambulatory Visit (INDEPENDENT_AMBULATORY_CARE_PROVIDER_SITE_OTHER): Payer: Medicare Other | Admitting: Family Medicine

## 2015-09-08 ENCOUNTER — Other Ambulatory Visit: Payer: Self-pay | Admitting: *Deleted

## 2015-09-08 VITALS — BP 142/72 | HR 77 | Temp 98.0°F | Wt 142.5 lb

## 2015-09-08 DIAGNOSIS — I10 Essential (primary) hypertension: Secondary | ICD-10-CM

## 2015-09-08 DIAGNOSIS — I639 Cerebral infarction, unspecified: Secondary | ICD-10-CM | POA: Diagnosis not present

## 2015-09-08 NOTE — Assessment & Plan Note (Addendum)
Improved control. Continue norvasc 10 mg daily. The patient indicates understanding of these issues and agrees with the plan.

## 2015-09-08 NOTE — Patient Outreach (Signed)
[facility]) Care Management  09/08/2015  Sherri [name] 06/20/1940 [id number]  Received telephone call from Waymon Budgelarence Coover, husband of Sherri PeoplesCreola [name], 75 year old female, [facility] HTN, after having stroke which required IP hospital visit February 26-August 07, 2015.    [name] was very upset, asking if I could "tell who cancelled the doctor's appointments," for Sherri [name], also stating, "they won't tell me anything at the doctor's office-- I just left there, and they say I am not on her list of emergency contacts... She is hiding her medicines from me, and I can't fill her pill box... She is also hiding her calendar from me, so I don't know when her doctor appointments are.  My son took her to the doctor today, and the doctor's office will not give me any information, because they say I am not on her list of emergency contacts."  Through EMR, verified that Sherri [name] was present today for her OV with Dr. [name]; verified that from what I am able to see in EMR, [name] is listed as an emergency contact for Sherri [name].  Encouraged [name] to discuss this issue with Sherri [name] and his son, and advised him to be present at the next OV with Dr. [name] to straighten this matter out.  [name] stated that he "just got off the phone with my son."  Verified notes from OV with Dr. [name] today indicate that Sherri [name] reports no issues getting or taking her current medications; because of Mr. Elizebeth KollerMeachem's report that contradict that information, I will route documentation of this phone call to Dr. [name] as an Lorain ChildesFYI.  I advised [name] to work with Mrs. Summer and their son to make sure that Sherri [name] attends her provider appointments and takes her medications as prescribed.  Made sure [name] has my contact information should he wish to communicate further with me prior to the next scheduled [facility]  in[facility]home visit on September 24, 2015, which he said he would be present in the home for.  Plan: [name] will discuss his concerns with Sherri [name]'s care with Sherri [name], their son, and with involved medical providers. I will route documentation of this phone call with [name] to Dr. [name] as Lorain ChildesFYI. [facility] April, 19, 2017.  Caryl PinaLaine Mckinney Collen Vincent, RN, BSN, [employer] Community Care Coordinator [facility] Care Management  [phone]

## 2015-09-08 NOTE — Progress Notes (Signed)
Pre visit review using our clinic review tool, if applicable. No additional management support is needed unless otherwise documented below in the visit note. 

## 2015-09-08 NOTE — Progress Notes (Signed)
Carelink Summary Report / Loop Recorder 

## 2015-09-08 NOTE — Progress Notes (Signed)
Subjective:   Patient ID: Sherri [name], female    DOB: 1941/05/13, 75 y.o.   MRN: [id number]  Sherri [name] is a pleasant 75 y.o. year old female who presents to clinic today with her son for Follow-up  on 09/08/2015  HPI:  HTN-  Restarted norvasc 10 mg daily at her last OV on 08/25/15.  Antihypertensives were d/c'd in the hospital  BP Readings from Last 3 Encounters:  09/08/15 142/72  08/28/15 122/72  08/25/15 152/74   States she is not having any issues getting her medications refilled.  Again feels she is managing ok and denies any symptoms of hypotension.  Now has a home health RN to help with her medications which has been helpful.  Although encouraged to check BP at home by myself and Ricke Hey, RN, she has not been doing so routinely.     Lab Results  Component Value Date   LDLCALC 49 08/04/2015   Lab Results  Component Value Date   HGBA1C 5.7* 08/04/2015   Lab Results  Component Value Date   CREATININE 1.18* 08/04/2015   Current Outpatient Prescriptions on File Prior to Visit  Medication Sig Dispense Refill  . amLODipine (NORVASC) 10 MG tablet Take 1 tablet (10 mg total) by mouth daily. 90 tablet 1  . aspirin EC 325 MG EC tablet Take 1 tablet (325 mg total) by mouth daily. 30 tablet 0  . Glucosamine-Chondroit-Vit C-Mn (GLUCOSAMINE 1500 COMPLEX) CAPS Take 1 capsule by mouth daily.     . mirtazapine (REMERON) 15 MG tablet Take 1 tablet (15 mg total) by mouth at bedtime. 90 tablet 0  . Omega-3 Fatty Acids (FISH OIL) 1000 MG CAPS Take 1 capsule by mouth daily.      . rosuvastatin (CRESTOR) 5 MG tablet Take 1 tablet (5 mg total) by mouth daily. 90 tablet 0   No current facility-administered medications on file prior to visit.    Allergies  Allergen Reactions  . Hydrogen Peroxide Swelling  . Sulfa Antibiotics     Past Medical History  Diagnosis Date  . Hyperlipidemia   . Hypertension   . Diabetes mellitus without complication [facility])     Past  Surgical History  Procedure Laterality Date  . Ep implantable device N/A 08/06/2015    Procedure: Loop Recorder Insertion;  Surgeon: Hillis Range, MD;  [facility] INV[facility] CV LAB;  Service: Cardiovascular;  Laterality: N/A;  . Tee without cardioversion N/A 08/06/2015    Procedure: TRANSESOPHAGEAL ECHOCARDIOGRAM (TEE);  Surgeon: Lars Masson, MD;  [facility];  Service: Cardiovascular;  Laterality: N/A;    No family history on file.  Social History   Social History  . Marital Status: Married    Spouse Name: N/A  . Number of Children: N/A  . Years of Education: N/A   Occupational History  . Not on file.   Social History Main Topics  . Smoking status: Never Smoker   . Smokeless tobacco: Never Used  . Alcohol Use: No  . Drug Use: No  . Sexual Activity: Not on file   Other Topics Concern  . Not on file   Social History Narrative   Desires CPR.   Would not want prolonged life support if futile   The PMH, PSH, Social History, Family History, Medications, and allergies have been reviewed in [facility], and have been updated if relevant.   Review of Systems  Constitutional: Negative.   HENT: Negative.   Eyes: Negative.   Respiratory: Negative.  Cardiovascular: Negative.   Gastrointestinal: Negative.   Endocrine: Negative.   Genitourinary: Negative.   Musculoskeletal: Negative.   Skin: Negative.   Neurological: Negative.   Hematological: Negative.   Psychiatric/Behavioral: Negative.   All other systems reviewed and are negative.      Objective:    BP 142/72 mmHg  Pulse 77  Temp(Src) 98 F (36.7 C) (Oral)  Wt 142 lb 8 oz (64.638 kg)  SpO2 97%   Physical Exam  Constitutional: She is oriented to person, place, and time. She appears well-developed and well-nourished. No distress.  HENT:  Head: Normocephalic.  Eyes: Conjunctivae are normal.  Neck: Normal range of motion.  Cardiovascular: Normal rate.   Pulmonary/Chest: Effort normal.    Musculoskeletal: Normal range of motion.  Neurological: She is alert and oriented to person, place, and time.  Walking with cane Decreased grip strength left  Skin: Skin is warm and dry.  Psychiatric: She has a normal mood and affect. Her behavior is normal. Judgment normal.  Nursing note and vitals reviewed.         Assessment & Plan:   Essential hypertension No Follow-up on file.

## 2015-09-10 ENCOUNTER — Telehealth: Payer: Self-pay | Admitting: *Deleted

## 2015-09-10 ENCOUNTER — Ambulatory Visit: Payer: Self-pay | Admitting: Neurology

## 2015-09-10 ENCOUNTER — Other Ambulatory Visit: Payer: Self-pay | Admitting: *Deleted

## 2015-09-10 DIAGNOSIS — I639 Cerebral infarction, unspecified: Secondary | ICD-10-CM

## 2015-09-10 NOTE — Patient Outreach (Signed)
[facility]) Care Management  09/10/2015  Elijah BirkCreola S Kats 10/20/1940 [id number]  Received telephone call from Waymon Budgelarence Gural, husband of Sherri PeoplesCreola Gudgel, 75 year old female, [facility] for HTN, after having stroke which required IP hospital visit February 26-August 07, 2015. Sherri [name] has cognitive issues as a result of her recent stroke, including memory and communication issues.  Mr. Lynford [name] was very upset today, stating that Mrs. Kasinger "cancelled her appointment with the neurologist today because she didn't want me going there with her."  Mr. Lynford [name] again reiterated that he believes that Sherri [name] is hiding her medicines from him, which prevents him from filling her pill box.  Mr. Lynford [name] states that he is very frustrated and feels as if Sherri [name] will not allow him to help her.  Mr. Lynford [name] reported that he plans to go to social services today to ask for assistance in this matter.  I again encouraged Mr. Lynford [name] to discuss these family dynamics/ issues with Mrs. Rowser and his son, to which he replied, "they will not allow me to be involved in her care."    I shared with Mr. Lynford [name] that I would reach out to [facility] CS[facility] to collaborate for suggestions surrounding the family dynamic issues that he has described.  Plan: I will refer [facility] CSW to collaborate on family dynamic issues. [facility] April, 19, 2017.  Caryl PinaLaine Mckinney Sosie Gato, RN, BSN, [employer] Community Care Coordinator [facility] Care Management  [phone]

## 2015-09-10 NOTE — Telephone Encounter (Addendum)
Patient cancel her appt today with Dr.Xu for hospital follow up. Pts son call phone staff to state she was sick. Pts husband came to office 30 min after the cancel appt to speak with the nurse. Pts husband  Sherri [name] stated his wife is noncompliant with her medications, and has cancel her other appts. Per patients husband she has cancel her other doctors appts too. Pts husband stated his wife does not want him knowing anything about her health.Pts husband stated his wife has her brother, and son over her medical care. Pts husband stated his daughter is a Proofread[employer]edical doctor and [place]. Pts has 3 daughters and a son. Pts husband stated his daughter, and two sons are trying to help his wife with her medical issues. He states his wife, brother in law, and other son are siding against him and the other kids. Pts husband thinks his wife thinks he wants her in the nursing home. Pts [name] stated its being a power struggle to help his wife when his brother in law and one son are interfering. Pts husband is trying to get HCPOA papers in process.Rn explain that his wife can always call back to schedule an appt. Pt was seen in the hospital by Dr. Roda ShuttersXu and will be a new patient to [facility].Rn notified Dr. Roda ShuttersXu of this issue with the family and patient.

## 2015-09-11 ENCOUNTER — Encounter: Payer: Self-pay | Admitting: Neurology

## 2015-09-11 DIAGNOSIS — I69322 Dysarthria following cerebral infarction: Secondary | ICD-10-CM | POA: Diagnosis not present

## 2015-09-11 DIAGNOSIS — I69393 Ataxia following cerebral infarction: Secondary | ICD-10-CM | POA: Diagnosis not present

## 2015-09-12 ENCOUNTER — Telehealth: Payer: Self-pay

## 2015-09-12 NOTE — Telephone Encounter (Signed)
[facility]/15 [facility] services. [facility] social worker is meeting with pt and pts husband 09/12/15. FYI to Dr [name].

## 2015-09-17 ENCOUNTER — Other Ambulatory Visit: Payer: Self-pay | Admitting: *Deleted

## 2015-09-17 NOTE — Patient Outreach (Signed)
[facility]) Care Management  09/17/2015  RHESA [name] 1940/12/30 [id number]  Successful telephone outreach to Sherri [name], who is a 75 y.o. female followed by [place] on referral from [place] after having had an inpatient hospital visit February 26-August 07, 2015 for stroke.  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  HH RN was involved primarily to fill Sherri [name]'s pill box for her for a limited amount of time; [facility] speech therapy has recently discharged Sherri [name] from their program.  Telephone outreach today to remind patient of our scheduled home visit appointment next week, and assess medication adherence.  The patient reported that she is doing "fine," but again states that she "can't find" her medication today, stating that she "had it yesterday, but can't find it today."  When I attempted to clarify this information, Sherri [name] stated that her husband "has hid them."  I asked her if she was at home alone, but the patient did not/ would answer.  I encouraged the patient to contact her family (husband and/or son) to assist with finding her medicine, to which she replied, "I'll take them when I can find them."  Sherri [name] would not/ could not tell me if she or her family had been filling her pill box as instructed by myself and [facility] RN during our last home visit.  I confirmed with Sherri [name] that I had a scheduled home visit planned with her next week, and she stated that she "would be there."  I told her that we would go over her medications thoroughly at that time, and encouraged her to keep her medications in one centralized place in her home so they would not get misplaced/ lost.  I consulted with Sherri [name], [facility] LCSW, in regard to the well-documented ongoing dynamics within this family unit. On September 02, 2015, the [facility] RN and I met with both Mr. And Sherri [name] present together to educate them on the process of  filling Sherri [name]'s pill box before [facility] RN was completed.  At that time, we stressed the importance for Sherri [name] to be compliant with her medications, and that they should work together to accomplish this goal; both Mr. And Sherri [name] agreed to work together at that time to make sure Sherri [name]'s pill box was filled appropriately.   Since then, however, I have received several calls from Sherri [name] regarding his frustration with Sherri [name] "keeping him out of the loop with her medicine and her doctor appointments, hiding her pills so I can't fill her pill box."  From review of the EMR, it appears that this ongoing issue around their family dynamics has affected Sherri [name]'s compliance with both medications and with keeping her provider appointments. Sherri [name] and I agreed to further assess this situation at the next [facility] home visit, scheduled for next week; if necessary at that time, I will reach out to involve Sherri [name]'s son, as indicated.  Plan: [place] home visit planned for next week.  Oneta Rack, RN, BSN, [employer] [facility] Care Management  [phone]

## 2015-09-24 ENCOUNTER — Other Ambulatory Visit: Payer: Self-pay | Admitting: *Deleted

## 2015-09-24 ENCOUNTER — Encounter: Payer: Self-pay | Admitting: *Deleted

## 2015-09-24 NOTE — Patient Outreach (Signed)
[facility]) Care Management   09/24/2015  Sherri [name] 1940/08/09 [id number]  Sherri [name] is an 75 y.o. female followed by [place] on referral from [place] after having IP hospital visit February 26-August 07, 2015 for CVA.  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    Focus of [facility] Community CM involvement has been on medication adherence and management.  On previous [facility] outreach home and telephone visits, it has been consistently noted that Sherri [name] provides inconsistent reporting of her medication availability and management.   Prior to [facility] RN ending after her hospital discharge, [facility] RN had filled Ms. [name]'s pill box for her, with good results and reported 100% patient compliance.  On September 02, 2015, this [place] RN and the [facility] RN met with both Mrs. and [name] to teach Mr. [name] how to complete filling of her pill box.  Since then, [name] has contacted me several times reporting that Sherri [name] "hides" her pills from him, which prevents him from completing re-filling of her pill box.  Conversely, Sherri [name] has reported to me that [name] is hiding her pills from her, stating that Sherri [name] can not find the medications to take them.     Today, the focus of our visit continues to be medication adherence and management.  Sherri [name] reported that Sherri [name] has been taking her medications as prescribed, and states that Sherri [name] has been filling her own pill box.  I visualized her pill box today, and it was not filled accurately.  Today, Sherri [name] only had her fish oil and glucosamine present inside of the pill box.  Sherri [name] and I both attempted to locate all of her medications within her pantry, and with some effort, finally did.   The patient's husband, [name] came home during my visit, and with both Mr. and [name] present, I gave (and filled) Sherri [name] (2) 7-day pill boxes and filled her own  pill box for the remainder of this week, giving her  a total of 2 and a half weeks of correctly filled pill boxes.  I again showed both Mrs. and Mr. [name] how to fill the pill boxes.  I instructed Sherri [name] to keep the pill boxes in a central location in her home, so her medications would not get misplaced or lost, and discussed with her the importance of taking all of her medications as prescribed, and letting her doctor know if Sherri [name] is not taking her medications as they are prescribed.   Before Mr. Bauder arrived at their home, I was able to question Sherri [name] about who Sherri [name] believed the best person in her family was to assist her with her healthcare needs, as well as which family member Sherri [name] preferred be involved in her health care, and Sherri [name] replied, "my husband."  I was also able to discuss with Sherri [name] the possibility that Sherri [name] might begin checking her BP's at home, and Sherri [name] again stated Sherri [name] did not wish to do so.  However, [name] brought out an automatic BP cuff, and I showed both of them how to properly use it, although the cuff appeared to have low batteries and malfunctioned.  [name] stated he would pick up new batteries and [name] agreed to consider checking her BP at home once their BP machine was properly functioning.  Sherri [name] states that Sherri [name] is not weighing herself at home, and  states Sherri [name] does not wish to weigh herself and does not have a working scale at her home.  Sherri [name] otherwise denies problems and needs today.  Subjective: "I like having my pill boxes filled. I don't like thinking about it, and when they are filled, I don't have to."  Objective:    BP 124/72 mmHg  Pulse 88  Resp 16  SpO2 96%   Review of Systems  Constitutional: Negative.  Negative for diaphoresis.  Respiratory: Negative.  Negative for cough, shortness of breath and wheezing.   Cardiovascular: Negative.  Negative for chest pain and leg swelling.  Gastrointestinal:  Negative.   Musculoskeletal: Negative.  Negative for myalgias, back pain, joint pain and falls.  Skin:       Pt. has an area of excoriation and dry patchiness on her (R) inner shin, which is not red, nor warm to touch.  Patient reports that this area has been there "for a long time," and states that Sherri [name] has not been treating this area.  Patient reports that her PCP has seen this area.  Neurological: Negative.  Negative for dizziness and weakness.  Psychiatric/Behavioral: Negative.  Negative for depression. The patient is not nervous/anxious.     Physical Exam  Constitutional: Sherri [name] is oriented to person, place, and time. Sherri [name] appears well-developed and well-nourished. No distress.  Cardiovascular: Normal rate, regular rhythm, normal heart sounds and intact distal pulses.   Respiratory: Effort normal and breath sounds normal. No respiratory distress. Sherri [name] has no wheezes. Sherri [name] has no rales.  GI: Soft. Bowel sounds are normal.  Musculoskeletal: Sherri [name] exhibits no edema.  Neurological: Sherri [name] is alert and oriented to person, place, and time.  Skin: Skin is warm and dry. No erythema.  See skin in ROS  Psychiatric: Sherri [name] has a normal mood and affect. Her behavior is normal.    Encounter Medications:   Outpatient Encounter Prescriptions as of 09/24/2015  Medication Sig Note  . amLODipine (NORVASC) 10 MG tablet Take 1 tablet (10 mg total) by mouth daily.   Marland Kitchen aspirin EC 325 MG EC tablet Take 1 tablet (325 mg total) by mouth daily.   . Glucosamine-Chondroit-Vit C-Mn (GLUCOSAMINE 1500 COMPLEX) CAPS Take 1 capsule by mouth daily.    . mirtazapine (REMERON) 15 MG tablet Take 1 tablet (15 mg total) by mouth at bedtime. 08/28/2015: Patient states that Sherri [name] does not think Sherri [name] can show me this medicine; [facility] RN present and states that when Sherri [name] fills pt.'s pill box, this medication is never present to be added to pill box.  . Omega-3 Fatty Acids (FISH OIL) 1000 MG CAPS Take 1 capsule by mouth daily.     . rosuvastatin  (CRESTOR) 5 MG tablet Take 1 tablet (5 mg total) by mouth daily.       Assessment:  Although Mrs. Moll's BP reading today was normal, her pill box was not filled correctly when I arrived.  There is an ongoing issue around Mrs. Spisak's medication adherence and management.   As a result of today's in-home visit, Mrs. Fessenden now has 2 and a half weeks of pill boxes that are accurately filled, and Sherri [name] has been instructed to keep her medication in a central location so that they cannot be misplaced or lost.  Mrs. Depace agrees to take her medications from the filled pill boxes for the next 2 and a half weeks.    Plan:  Mrs. Shrader will keep her medications in a central area in her home, and for the next  2 and a half weeks, will take her medications from the pill boxes that have been properly filled today. Mrs. Mallin will keep all scheduled provider appointments. I will route this note to Dr. Deborra Medina, Mrs. Vanderslice's PCP, as an Juluis Rainier. Mrs. Vivar will consider taking and recording her BP measurements at home, once the automatic BP cuff is functioning correctly. Next [place] in-home visit planned for next month.   Oneta Rack, RN, BSN, [employer] [facility] Care Management  [phone]

## 2015-10-06 ENCOUNTER — Ambulatory Visit (INDEPENDENT_AMBULATORY_CARE_PROVIDER_SITE_OTHER): Payer: Medicare Other | Admitting: *Deleted

## 2015-10-06 DIAGNOSIS — I639 Cerebral infarction, unspecified: Secondary | ICD-10-CM | POA: Diagnosis not present

## 2015-10-06 NOTE — Progress Notes (Signed)
Carelink Summary Report / Loop Recorder 

## 2015-10-07 ENCOUNTER — Ambulatory Visit: Payer: Self-pay | Admitting: Neurology

## 2015-10-10 ENCOUNTER — Other Ambulatory Visit: Payer: Self-pay | Admitting: *Deleted

## 2015-10-10 MED ORDER — MIRTAZAPINE 15 MG PO TABS: 15.0000 mg | ORAL_TABLET | Freq: Every day | ORAL | Status: DC

## 2015-10-10 MED ORDER — ROSUVASTATIN CALCIUM 5 MG PO TABS: 5.0000 mg | ORAL_TABLET | Freq: Every day | ORAL | Status: DC

## 2015-10-13 ENCOUNTER — Other Ambulatory Visit: Payer: Self-pay | Admitting: *Deleted

## 2015-10-13 VITALS — BP 158/88 | HR 112 | Resp 18

## 2015-10-13 DIAGNOSIS — I10 Essential (primary) hypertension: Secondary | ICD-10-CM

## 2015-10-13 NOTE — Patient Outreach (Signed)
[facility]) Care Management   10/13/2015  Sherri [name] [birth year] [id number]  DEMMI SINDT is an 75 y.o. female followed by [facility] on referral from [place] after having IP hospital visit February 26-August 07, 2015 for CVA.  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    Focus of [facility] Community CM involvement has been on medication adherence and management.  On previous [facility] outreach home and telephone visits, it has been consistently noted that Mrs. Burgard provides inconsistent reporting of her medication availability and management.     On September 02, 2015, prior to [facility] RN ending after her hospital discharge, the [facility] RN and I met met with both Mrs. and Mr. Castellanos to teach Mr. Nickell how to complete filling of Mrs. Munoz's pill box since [facility] would no longer be visiting patient in her home to perform this function. Unfortunately, that intervention was unsuccessful and I was contacted by Mr. Maxham several several times afterward, where he reported that Mrs. Manetta "hides" her pills from him, which prevents him from completing re-filling of her pill box.  Conversely, Mrs. Brossart reported that Mr. Nichelson is hiding her pills from her, stating that she can not find the medications to take them.   On September 24, 2015, I visited with both Mrs. Deridder and Mr. Devito to re-inforce previous teaching of filling Mrs. Chaloux's pill box accurately and appropriately.  At that time, I filled 19 days of pills in her pill boxes, enough to last until our scheduled appointment today.    When I arrived at Mrs. Richman's home today, she reported that her brother from [place] had visited her over the weekend and had filled the pill boxes for her; she reported that she had taken her medications as they were prescribed after I filled the boxes September 24, 2015, and voiced that she would continue to do so now that her brother had filled them for the next  3 weeks.  Mrs. Perri was unable to locate her actual bottles of medications from which her brother filled the pill boxes, so we went over her medications verbally and she reported no changes, and she voiced a general understanding of the medications and their purposes.  Upon visual review of her medication boxes today, they appreared to have been filled appropriately, but without her actual pill bottles, I was unable to positively confirm.   Unfortunately, during our visit today, Mr. and Mrs. Seitzinger became angry with one another and began communicating with each other in very loud and heated voices, to which Mrs. Swinford stated, "that is why my blood pressure is high today."  I had previously consulted with [place] staff about the possibility of having pre-packaged blister-packs prepared and delivered to Mrs. Bubar, and is was confirmed that was an available option for her.  I described this to Mrs. Derryberry and she verbalized an interest in having this done so that she could have her medication "ready for me to take everyday" without having to rely on pill boxes.  I further questioned Mrs. Ringel if she had been checking her BP as we discussed during our last visit on September 24, 2015, but she and Mr. Stamant were so upset with one another that she did not respond to answer my question.  Mr. Gowens stated that she had not been checking her BP at home.   Subjective: "I have been taking my medicine like I am supposed to.  I would like to look in to having the medicines sent to me in the blister packs you have told me about today."  Objective:    BP 158/88 mmHg  Pulse 112  Resp 18  SpO2 97%   Review of Systems  Constitutional: Negative.  Negative for weight loss and malaise/fatigue.  Respiratory: Negative.  Negative for cough, sputum production, shortness of breath and wheezing.   Cardiovascular: Negative.  Negative for leg swelling.  Gastrointestinal: Negative.   Genitourinary:  Negative.   Musculoskeletal: Negative.  Negative for myalgias and falls.  Neurological: Negative.  Negative for weakness.  Psychiatric/Behavioral: The patient is nervous/anxious.        Presumably, this is related to the disagreement that Mrs. Kram is having with her husband during the time of the visit today.    Physical Exam  Constitutional: She is oriented to person, place, and time. She appears well-developed and well-nourished.  Cardiovascular: Regular rhythm, normal heart sounds and intact distal pulses.   HR elevated, presumably secondary to disagreement she is having with her husband at the time of [place] CM visit  Respiratory: Effort normal and breath sounds normal. No respiratory distress. She has no wheezes. She has no rales.  GI: Soft. Bowel sounds are normal.  Musculoskeletal: She exhibits no edema.  Neurological: She is alert and oriented to person, place, and time.  Skin: Skin is warm and dry.  Psychiatric: Her behavior is normal. Thought content normal.    Encounter Medications:   Outpatient Encounter Prescriptions as of 10/13/2015  Medication Sig  . amLODipine (NORVASC) 10 MG tablet Take 1 tablet (10 mg total) by mouth daily.  Marland Kitchen aspirin EC 325 MG EC tablet Take 1 tablet (325 mg total) by mouth daily.  . Glucosamine-Chondroit-Vit C-Mn (GLUCOSAMINE 1500 COMPLEX) CAPS Take 1 capsule by mouth daily.   . mirtazapine (REMERON) 15 MG tablet Take 1 tablet (15 mg total) by mouth at bedtime.  . Omega-3 Fatty Acids (FISH OIL) 1000 MG CAPS Take 1 capsule by mouth daily.    . rosuvastatin (CRESTOR) 5 MG tablet Take 1 tablet (5 mg total) by mouth daily.   No facility-administered encounter medications on file as of 10/13/2015.     Assessment:  There is an ongoing issue around Mrs. Milleson's medication adherence and management.Today Mrs. Bullinger reports that her brother has prepared 3 weeks of pill boxes that appear to be appropriately filled, despite that Mrs. Solum is  unable to produce her actual pill/ prescription bottles for confirmation.  Mrs. Krzyzanowski agrees to take her medications from the filled pill boxes for the next 3 weeks, and voices interest in the possibility of having pre-packed blister medication packs delivered to her home from the pharmacy.   Plan:  Mrs. Oberman will keep her medications in a central area in her home, and for the next 3 weeks, will take her medications from the pill boxes that she reports have been filled by her brother. I will make a referral to [place] to explore the possibility of having pre-packed blister packs available for Mrs. Grieser.  Mrs. Suderman will keep all scheduled provider appointments. I will route this note to Dr. Deborra Medina, Mrs. Shinall's PCP, as an Juluis Rainier. Next [place] in-home visit planned for next month.  Oneta Rack, RN, BSN, [employer] [facility] Care Management  [phone]

## 2015-11-01 LAB — CUP PACEART REMOTE DEVICE CHECK: Date Time Interrogation Session: 20170331183707

## 2015-11-01 NOTE — Progress Notes (Signed)
Carelink summary report received. Battery status OK. Normal device function. No new symptom episodes, tachy episodes, brady, or pause episodes. No new AF episodes. Monthly summary reports and ROV/PRN 

## 2015-11-04 ENCOUNTER — Ambulatory Visit (INDEPENDENT_AMBULATORY_CARE_PROVIDER_SITE_OTHER): Payer: Medicare Other | Admitting: *Deleted

## 2015-11-04 DIAGNOSIS — I639 Cerebral infarction, unspecified: Secondary | ICD-10-CM

## 2015-11-05 ENCOUNTER — Other Ambulatory Visit: Payer: Self-pay | Admitting: Pharmacist

## 2015-11-05 NOTE — Patient Outreach (Signed)
[facility]) Care Management  [facility]   11/05/2015  Elijah BirkCreola S [name] 02/27/1941 [id number]  Subjective:  [facility] received a referral from SylvesterLaine, [facility] RN [facility] to reach out to patient regarding potential adherence packing as patient reported to AlamoLaine she would like to learn about this over her current method of filling weekly pill planners.    Pharmacist placed call to patient and patient verified her name and date of birth.  Pharmacist explained purpose of call.    Patient reports [facility]  Discussed with patient bubble packing of her medications may require her to change her prescriptions to a different pharmacy as not all pharmacies perform bubble packing.  Patient stated that she is not interested in bubble pacing if she needs [facility].    Objective:   Current Medications: Current Outpatient Prescriptions  Medication Sig Dispense Refill  . amLODipine (NORVASC) 10 MG tablet Take 1 tablet (10 mg total) by mouth daily. 90 tablet 1  . aspirin EC 325 MG EC tablet Take 1 tablet (325 mg total) by mouth daily. 30 tablet 0  . Glucosamine-Chondroit-Vit C-Mn (GLUCOSAMINE 1500 COMPLEX) CAPS Take 1 capsule by mouth daily.     . mirtazapine (REMERON) 15 MG tablet Take 1 tablet (15 mg total) by mouth at bedtime. 90 tablet 0  . Omega-3 Fatty Acids (FISH OIL) 1000 MG CAPS Take 1 capsule by mouth daily.      . rosuvastatin (CRESTOR) 5 MG tablet Take 1 tablet (5 mg total) by mouth daily. 90 tablet 0   No current facility-administered medications for this visit.    Functional Status: In your present state of health, do you have any difficulty performing the following activities: 08/28/2015 08/18/2015  Hearing? N N  Vision? N N  Difficulty concentrating or making decisions? Malvin JohnsY Y  Walking or climbing stairs? Y Y  Dressing or bathing? N -  Doing errands,  shopping? Malvin JohnsY Y  Preparing Food and eating ? N N  Using the Toilet? N N  In the past six months, have you accidently leaked urine? N N  Do you have problems with loss of bowel control? N N  Managing your Medications? Y Y  Managing your Finances? Malvin JohnsY Y  Housekeeping or managing your Housekeeping? Malvin JohnsY Y    Fall/Depression Screening: PHQ 2/9 Scores 08/28/2015 08/18/2015 08/11/2015 03/24/2015  PHQ - 2 Score 0 0 0 0    Assessment:  Patient is not interested [facility] a different [facility] that does bubble packing.  Pharmacies who do bubble packing may charge a fee for it as well.    Patient did not wish to review her medications over the phone at this time.   Plan:  Given patient is not interested in changing which pharmacy she uses at this time and denies other pharmacy concerns, will not open a case and will close out at this time.   Placed a call to Su HiltLaine, [facility] RN [facility] Care Coordinator to update her of this.  Discussed with Su HiltLaine that if patient is more receptive to changing pharmacies for bubble packing at her next scheduled patient contact, pharmacist would be happy to reach back out to patient to discuss.   Tommye StandardKevin Sherlin Sonier, PharmD, [facility] HospitalBCACP Clinical Pharmacist [facility] [phone]

## 2015-11-05 NOTE — Progress Notes (Signed)
Carelink Summary Report / Loop Recorder 

## 2015-11-07 ENCOUNTER — Other Ambulatory Visit: Payer: Self-pay | Admitting: *Deleted

## 2015-11-07 NOTE — Patient Outreach (Addendum)
[facility]) Care Management [facility] CM Telephone Outreach/ Patient family member 11/07/2015  Sherri BirkCreola S [name] 01/02/1941 [id number]   Successful telephone outreach/ (returned VM) to [facility] Stormes, (husband of [name]), 75 y/o female, [facility] after having IP hospital visit February 26-August 07, 2015 for CVA. Mrs. 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 Focus of [facility] Community CM involvement has been on medication adherence and management. Family dynamic issues have been ongoing during [facility] involvement.  Today, [name] states that he believes Ms. [name] is "getting worse," stating that she is "combative" toward him, and can no longer control her bladder, and "wets herself."  [name] stated that he is "trying to get her some help," and wished to have Ms. [name] declared incompetent by the state.  [name]  reports that he has called social services, and spoken with "[name]," who advised that he should proceed through use of the court system.  [name] asked if [facility] services could assist him in that process.  I had already spoken to [name] about his ongoing request on September 10, 2015, and reminded him of our previous conversation.  I advised [name] that I would speak with [facility] CSW (once again) to confirm that the information provided to him previously by [facility] CSW was accurate.  I encouraged [name] to follow the advice provided by Ms. [name] with social services.  I discussed at length with [name] [facility] Community CM role/ purpose, to which he verbalized understanding.  We confirmed Monday November 10, 2015  (previously scheduled) [facility] appointment, and discussed the possibility that Ms. [name] may be discharged from [facility] at that time.  [name] verbalized understanding, and denied further questions.  Same day  consultation with [name], [facility] CSW, was made and she confirmed that [name] should proceed through court system to have his request addressed, as advised by Ms. [name] with Social services.  Plan:  [facility] CM home visit, possibly for [facility] services, scheduled for Monday November 10, 2015.  [name], RN, BSN, [facility] Community Care Coordinator [facility] Care Management  [phone]

## 2015-11-10 ENCOUNTER — Encounter: Payer: Self-pay | Admitting: *Deleted

## 2015-11-10 ENCOUNTER — Other Ambulatory Visit: Payer: Self-pay | Admitting: *Deleted

## 2015-11-10 NOTE — Patient Outreach (Signed)
[facility]) Care Management   11/10/2015  Drue Camera Devincentis 1940-08-08 [id number]  LASHENA SIGNER is an 75 y.o. female followed by [place] on referral from [place] after having IP hospital visit February 26-August 07, 2015 for CVA.  Mrs. 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    Focus of [facility] Community CM involvement has been on medication adherence and management.  On previous [facility] outreach home and telephone visits, it has been noted that Mrs. Wegner provides inconsistent reporting of her medication availability and management.  Pill boxes were provided to patient, and patient and her husband were instructed in keeping pill boxes filled by [facility] RN CM and [facility] RN on August 28, 2015.  Unfortunately, there have been ongoing issues within their personal family dynamics, and Mr. Henkin has not been successful in assisting Mrs. Cheetham in medication management or keeping her pill boxes filled.  However, with both the previous [place] home visit on Oct 13, 2015 and again today, Ms. Barletta pill boxes were appropriately filled and she was able to locate all of her pill bottles for accurate medication reconciliation.  Ms. Mezquita has reported on both occasions that her brother from [place], [place] comes to visit her and assists her with keeping her pill boxes updated.  Mrs. Georgia reports that she is taking all of her medications every day as they are prescribed.  With patient agreement during [place] home visit on Oct 13, 2015, [place] RN CM made referral to [place] to explore options for blister packaging for her medications.  However, when patient was presented with options for same, patient declined stating that she did not wish to [facility] which [facility] does not offer blister packaging.  Today, I received verbal consent from Mrs. South to reach out to her son Gerhard Munch at [phone] (on [facility]  informed consent) regarding Mrs. Markowicz's medication management going forward, as it appears from last two [place] in-home visits that Mrs. Tuggle has met her goal for medication management.  I was able to successfully connect with [facility] today, and he confirmed that he believes that Ms. Howse is taking her medications as they are prescribed.  I discussed with Monte that since Ms. Castello has apparently successfully met this goal, that [place] would no longer be actively involved in her care, and he was in agreement, stating that he "helps with her medications whenever she needs it."  I made sure Monte knew how to contact [place] in the future should he feel that Ms. Djordjevic needs further assistance.  Ms. Mccall again reported that she has not been monitoring or checking her BP's with her home automatic BP cuff.  Ms. Semple again reports that she does not wish to monitor her BP at home.  Mr. Chretien asked if [place] could "get some help for Ms. Harbeck to get the house in order."  Upon clarification of his request, Mr. Offutt stated that he would like to have some help with housecleaning and "getting the house in order."  Ms. Ashkar verbally and vehemently opposed this, stating that she "didn't need or want anybody coming in to help out with cleaning," stating that since Mr. Fernandez "doesn't help with housecleaning, why should I?"   I explained to Mrs. Looney and Mr. Gruwell that [place] would no longer be actively involved in her care, as it appeared that  she has met her goal of successful medication management.  Mrs. Barna stated that she did not wish to have a [facility] health coach contact her, again stating that she "was doing fine with her brother's help."  I made sure that Mrs. Uriarte had my direct contact information, the 24-hour nurse line phone number, as well as the main [facility] CM phone number, should she need assistance in the future.  Mrs. Basher was  agreeable to [facility] discharge.  Subjective: "I have been taking my medicine like I am supposed to.  I am doing fine and will keep taking my medicine the way they tell me to."  Objective:    BP 122/74 mmHg  Pulse 84  Resp 18  SpO2 98%   Review of Systems  Constitutional: Negative.  Negative for fever and malaise/fatigue.  Respiratory: Negative.  Negative for cough, shortness of breath and wheezing.   Cardiovascular: Negative.  Negative for chest pain, palpitations and leg swelling.  Genitourinary: Positive for urgency. Negative for dysuria.       Patient reports that she had an incident "last week" where she was unable to "hold her urine."  Patient states that she "couldn't make it to the bathroom fast enough and wet herself."  Musculoskeletal: Negative for myalgias, joint pain and falls.  Neurological: Negative for dizziness and weakness.  Psychiatric/Behavioral: Negative for depression. The patient is not nervous/anxious.     Physical Exam  Constitutional: She is oriented to person, place, and time. She appears well-developed and well-nourished.  Cardiovascular: Normal rate, regular rhythm, normal heart sounds and intact distal pulses.   Respiratory: Effort normal and breath sounds normal. No respiratory distress. She has no wheezes. She has no rales.  GI: Soft. Bowel sounds are normal.  Musculoskeletal: She exhibits no edema.  Neurological: She is alert and oriented to person, place, and time.  Skin: Skin is warm and dry.  Psychiatric: She has a normal mood and affect. Her behavior is normal. Thought content normal.    Encounter Medications:   Outpatient Encounter Prescriptions as of 11/10/2015  Medication Sig  . amLODipine (NORVASC) 10 MG tablet Take 1 tablet (10 mg total) by mouth daily.  Marland Kitchen aspirin EC 325 MG EC tablet Take 1 tablet (325 mg total) by mouth daily.  . Glucosamine-Chondroit-Vit C-Mn (GLUCOSAMINE 1500 COMPLEX) CAPS Take 1 capsule by mouth daily.   . mirtazapine  (REMERON) 15 MG tablet Take 1 tablet (15 mg total) by mouth at bedtime.  . Omega-3 Fatty Acids (FISH OIL) 1000 MG CAPS Take 1 capsule by mouth daily.    . rosuvastatin (CRESTOR) 5 MG tablet Take 1 tablet (5 mg total) by mouth daily.   No facility-administered encounter medications on file as of 11/10/2015.    Functional Status:   In your present state of health, do you have any difficulty performing the following activities: 08/28/2015 08/18/2015  Hearing? N N  Vision? N N  Difficulty concentrating or making decisions? Tempie Donning  Walking or climbing stairs? Y Y  Dressing or bathing? N -  Doing errands, shopping? Tempie Donning  Preparing Food and eating ? N N  Using the Toilet? N N  In the past six months, have you accidently leaked urine? N N  Do you have problems with loss of bowel control? N N  Managing your Medications? Y Y  Managing your Finances? Tempie Donning  Housekeeping or managing your Housekeeping? Tempie Donning    Fall/Depression Screening:    [facility] 2/9 Scores 08/28/2015 08/18/2015 08/11/2015  03/24/2015  PHQ - 2 Score 0 0 0 0    Assessment:  The issue around Mrs. Melin's medication adherence and management appears to have been resolved with the ongoing assistance of Mrs. Parson's brother in filling her pill boxes. Mrs. Rubey is not interested in changing pharmacy providers in order to have pre-filled blister packs delivered to her home.  Mrs. Thien and her son Gerhard Munch report that she is taking her medications as they are prescribed.  Will discharge form [place] CM, as it appears that care goal around medication management has been successfully met.   Plan:  I will route this note to Dr. Deborra Medina, Mrs. Delconte's PCP, as an Juluis Rainier and inform of patient discharge from [place].   Oneta Rack, RN, BSN, [employer] [facility] Care Management  [phone]

## 2015-11-16 LAB — CUP PACEART REMOTE DEVICE CHECK: Date Time Interrogation Session: 20170430190519

## 2015-11-16 NOTE — Progress Notes (Signed)
Carelink summary report received. Battery status OK. Normal device function. No new symptom episodes, tachy episodes, brady, or pause episodes. No new AF episodes. Monthly summary reports and ROV/PRN 

## 2015-12-04 ENCOUNTER — Ambulatory Visit (INDEPENDENT_AMBULATORY_CARE_PROVIDER_SITE_OTHER): Payer: Medicare Other | Admitting: *Deleted

## 2015-12-04 DIAGNOSIS — I639 Cerebral infarction, unspecified: Secondary | ICD-10-CM

## 2015-12-05 NOTE — Progress Notes (Signed)
Carelink Summary Report / Loop Recorder 

## 2015-12-12 LAB — CUP PACEART REMOTE DEVICE CHECK: MDC IDC SESS DTM: 20170530194146

## 2015-12-26 LAB — CUP PACEART REMOTE DEVICE CHECK: Date Time Interrogation Session: 20170629200547

## 2016-01-05 ENCOUNTER — Other Ambulatory Visit: Payer: Self-pay | Admitting: Family Medicine

## 2016-01-05 ENCOUNTER — Ambulatory Visit (INDEPENDENT_AMBULATORY_CARE_PROVIDER_SITE_OTHER): Payer: Medicare Other | Admitting: *Deleted

## 2016-01-05 DIAGNOSIS — I639 Cerebral infarction, unspecified: Secondary | ICD-10-CM | POA: Diagnosis not present

## 2016-01-05 NOTE — Progress Notes (Signed)
Carelink Summary Report / Loop Recorder 

## 2016-01-15 LAB — CUP PACEART REMOTE DEVICE CHECK: Date Time Interrogation Session: 20170729201505

## 2016-01-23 ENCOUNTER — Telehealth: Payer: Self-pay | Admitting: Family Medicine

## 2016-01-23 DIAGNOSIS — Z1211 Encounter for screening for malignant neoplasm of colon: Secondary | ICD-10-CM

## 2016-01-23 NOTE — Telephone Encounter (Signed)
Pt called stating she received a call stating she needs a screening colonoscpy [facility] number [phone]

## 2016-01-23 NOTE — Telephone Encounter (Signed)
Referral placed.

## 2016-01-26 ENCOUNTER — Ambulatory Visit (INDEPENDENT_AMBULATORY_CARE_PROVIDER_SITE_OTHER): Payer: Medicare Other | Admitting: Family Medicine

## 2016-01-26 ENCOUNTER — Encounter: Payer: Self-pay | Admitting: Family Medicine

## 2016-01-26 VITALS — BP 160/88 | HR 82 | Temp 98.2°F | Wt 142.5 lb

## 2016-01-26 DIAGNOSIS — F439 Reaction to severe stress, unspecified: Secondary | ICD-10-CM

## 2016-01-26 DIAGNOSIS — I639 Cerebral infarction, unspecified: Secondary | ICD-10-CM | POA: Diagnosis not present

## 2016-01-26 DIAGNOSIS — Z638 Other specified problems related to primary support group: Secondary | ICD-10-CM | POA: Diagnosis not present

## 2016-01-26 DIAGNOSIS — Z79899 Other long term (current) drug therapy: Secondary | ICD-10-CM

## 2016-01-26 NOTE — Telephone Encounter (Signed)
Spoke to pt's brother. He is confused and upset about why pt needs colonoscopy. I explained to pt's brother, Fayrene FearingJames that the pt called us Friday requesting screening colonoscopy. Fayrene FearingJames wants Dr. [name] to call him directly since this was not mentioned at OV today 8/21.  [facility] will be helping with HH needs

## 2016-01-26 NOTE — Telephone Encounter (Signed)
Lm on pts brother's vm and requested a call back

## 2016-01-26 NOTE — Addendum Note (Signed)
Addended by: Dianne DunARON, TALIA M on: 01/26/2016 02:14 PM   Modules accepted: Orders

## 2016-01-26 NOTE — Progress Notes (Signed)
Subjective:   Patient ID: Sherri BirkCreola S Suit, female    DOB: 11/30/1940, 75 y.o.   MRN: [id number]  Sherri [name] is a pleasant 75 y.o. year old female who presents to clinic today with her brother for stressors at home  on 01/26/2016  HPI:  Remains a difficult home situation. Brother has called APS.  He is not concerned that her husband is harming her but he is concerned that he is not managing her rxs appropriately.   BP Readings from Last 3 Encounters:  01/26/16 (!) 160/88  11/10/15 122/74  10/13/15 (!) 158/88   In past, has had home health RN to help with her medications which has been helpful.   Ricke HeyLaine Tousey, RN,  Had been to the home multiple times as well.  Ms. Mariel SleetCreola denies feeling in danger at home.     Lab Results  Component Value Date   LDLCALC 49 08/04/2015   Lab Results  Component Value Date   HGBA1C 5.7 (H) 08/04/2015   Lab Results  Component Value Date   CREATININE 1.18 (H) 08/04/2015   Current Outpatient Prescriptions on File Prior to Visit  Medication Sig Dispense Refill  . amLODipine (NORVASC) 10 MG tablet Take 1 tablet (10 mg total) by mouth daily. 90 tablet 1  . aspirin EC 325 MG EC tablet Take 1 tablet (325 mg total) by mouth daily. 30 tablet 0  . Glucosamine-Chondroit-Vit C-Mn (GLUCOSAMINE 1500 COMPLEX) CAPS Take 1 capsule by mouth daily.     . mirtazapine (REMERON) 15 MG tablet TAKE 1 TABLET (15 MG TOTAL) BY MOUTH AT BEDTIME. 90 tablet 0  . Omega-3 Fatty Acids (FISH OIL) 1000 MG CAPS Take 1 capsule by mouth daily.      . rosuvastatin (CRESTOR) 5 MG tablet TAKE 1 TABLET (5 MG TOTAL) BY MOUTH DAILY. 90 tablet 0   No current facility-administered medications on file prior to visit.     Allergies  Allergen Reactions  . Hydrogen Peroxide Swelling  . Sulfa Antibiotics     Past Medical History:  Diagnosis Date  . Diabetes mellitus without complication (HCC)   . Hyperlipidemia   . Hypertension     Past Surgical History:  Procedure  Laterality Date  . EP IMPLANTABLE DEVICE N/A 08/06/2015   Procedure: Loop Recorder Insertion;  Surgeon: Hillis RangeJames Allred, MD;  Location: [facility];  Service: Cardiovascular;  Laterality: N/A;  . TEE WITHOUT CARDIOVERSION N/A 08/06/2015   Procedure: TRANSESOPHAGEAL ECHOCARDIOGRAM (TEE);  Surgeon: Lars MassonKatarina H Nelson, MD;  [facility];  Service: Cardiovascular;  Laterality: N/A;    No family history on file.  Social History   Social History  . Marital status: Married    Spouse name: N/A  . Number of children: N/A  . Years of education: N/A   Occupational History  . Not on file.   Social History Main Topics  . Smoking status: Never Smoker  . Smokeless tobacco: Never Used  . Alcohol use No  . Drug use: No  . Sexual activity: Not on file   Other Topics Concern  . Not on file   Social History Narrative   Desires CPR.   Would not want prolonged life support if futile   The PMH, PSH, Social History, Family History, Medications, and allergies have been reviewed in [facility], and have been updated if relevant.   Review of Systems  Constitutional: Negative.   HENT: Negative.   Eyes: Negative.   Respiratory: Negative.   Cardiovascular: Negative.  Gastrointestinal: Negative.   Endocrine: Negative.   Genitourinary: Negative.   Musculoskeletal: Negative.   Skin: Negative.   Neurological: Negative.   Hematological: Negative.   Psychiatric/Behavioral: Negative.   All other systems reviewed and are negative.      Objective:    BP (!) 160/88   Pulse 82   Temp 98.2 F (36.8 C) (Oral)   Wt 142 lb 8 oz (64.6 kg)   SpO2 95%   BMI 23.00 kg/m   Wt Readings from Last 3 Encounters:  01/26/16 142 lb 8 oz (64.6 kg)  09/08/15 142 lb 8 oz (64.6 kg)  08/25/15 144 lb 8 oz (65.5 kg)     Physical Exam  Constitutional: She is oriented to person, place, and time. She appears well-developed and well-nourished. No distress.  HENT:  Head: Normocephalic.  Eyes: Conjunctivae  are normal.  Neck: Normal range of motion.  Cardiovascular: Normal rate.   Pulmonary/Chest: Effort normal.  Musculoskeletal: Normal range of motion.  Neurological: She is alert and oriented to person, place, and time.  Skin: Skin is warm and dry.  Psychiatric: She has a normal mood and affect. Her behavior is normal. Judgment normal.  Nursing note and vitals reviewed.         Assessment & Plan:   No diagnosis found. No Follow-up on file.

## 2016-01-26 NOTE — Assessment & Plan Note (Addendum)
Will reach out again to Ricke HeyLaine Tousey, ask her to speak with me and with Ms. Elizebeth KollerMeachem's brother. >25 minutes spent in face to face time with patient, >50% spent in counselling or coordination of care

## 2016-01-26 NOTE — Telephone Encounter (Signed)
Sherri [name], please call Sherri [name].  I just called number below- no answer and no VM.  I only ordered a colonoscopy bc pt called stating she wanted to proceed with screening colonoscopy.

## 2016-01-26 NOTE — Progress Notes (Signed)
Pre visit review using our clinic review tool, if applicable. No additional management support is needed unless otherwise documented below in the visit note. 

## 2016-01-30 ENCOUNTER — Other Ambulatory Visit: Payer: Self-pay | Admitting: Family Medicine

## 2016-01-30 DIAGNOSIS — F438 Other reactions to severe stress: Secondary | ICD-10-CM | POA: Diagnosis not present

## 2016-01-30 DIAGNOSIS — I69311 Memory deficit following cerebral infarction: Secondary | ICD-10-CM | POA: Diagnosis not present

## 2016-02-02 ENCOUNTER — Other Ambulatory Visit: Payer: Self-pay | Admitting: *Deleted

## 2016-02-02 ENCOUNTER — Ambulatory Visit (INDEPENDENT_AMBULATORY_CARE_PROVIDER_SITE_OTHER): Payer: Medicare Other | Admitting: *Deleted

## 2016-02-02 ENCOUNTER — Telehealth: Payer: Self-pay | Admitting: *Deleted

## 2016-02-02 DIAGNOSIS — I639 Cerebral infarction, unspecified: Secondary | ICD-10-CM | POA: Diagnosis not present

## 2016-02-02 NOTE — Telephone Encounter (Signed)
I am sorry she is still having a tough time.  We have had services come to the home.  I will route this gain to Liane to see if there is more we can do.  Ok to give verbal orders as requested.

## 2016-02-02 NOTE — Patient Outreach (Signed)
[facility] Customer service managerHealthCare Network [facility]N) Care Management [facility] CM Telephone Outreach, Care Coordination call 02/02/2016  Sherri BirkCreola S [name] 12/30/1940 [id number]  Unsuccessful telephone outreach to Sherri [name], [name] at [phone]; Sherri [name] had left a voice mail message on my voicemail asking me to contact her regarding previous [facility] Community CM involvement in patient's care.  I left Sherri [name] a return voicemail message, and eventually successfully connected with her at 11:10 am when she returned my previous call from this morning.  I provided Sherri [name] with information on previous [facility] Community CM involvement with patient's care, as she requested.  Sherri [name] expressed concern with patient's family dynamics around the patient's medication administration and adherence, because the patient has told [facility] staff that her husband "messes with" her pills, and "removes them" from her pill box, while the husband has told [facility] staff that patient "hides" her medications from him, which prevents him from assisting her with her medications.  I explained to Sherri [name] that during the time [facility] CM was actively involved in patient's care, the scenario she described was an ongoing issue between the patient and her husband.  I shared with her that I had [facility], who offered to arrange pre-packed  bubble-packs of the patient's pills to be delivered to her home in daily doses, which was refused by the patient at that time.  I also shared with Sherri [name] that I had consulted with [facility] CSW about the family dynamics, as the patient was clearly competent at the time of [facility] CM involvement, and yet her husband continually requested that [facility] CM assist him with placing the patient in a SNF; unfortunately, the patient has listed her husband as her contact on the [facility] written consent she provided.  Patient was adamant during previous TH[facility] CM involvement in her care that she did not  wish to be at SNF, and wished to remain at her home.  While active with patient, I had also contacted patient's son, Sherri [name] (also on [facility] written consent), who stated that he was unable to assist regularly with filling of patient's pill boxes, and acknowledged that the dynamics between the patient and her husband were ongoing "for as long as" he "could remember."  I updated Sherri [name] that the patient and her husband argued frequently with one another during each previous [facility] Community CM in-home visits.  Sherri [name] was appreciative of the information provided to her today.  Sherri [name] Sherri Stutz, RN, BSN, [employer] Community Care Coordinator [facility] Care Management  [phone]

## 2016-02-02 NOTE — Telephone Encounter (Signed)
Evelena Peat with [facility] called stating that he went out Friday to do start up care for patient.  Evelena Peat stated that the patient is left alone a lot because her husband goes off for long periods of time. Evelena Peat stated that patient is not doing well with managing her medications and has fallen several times in the past few months. Evelena Peat stated that she was confused when he met with her Friday.  Evelena Peat stated that he talked with patient's  brother and he said that this has been going on for some time and that her medications are not being taken on time. Evelena Peat stated that the patient has left the stove on with food on it. Evelena Peat stated that he had to track down patient's husband and did discuss issues with him. Mr. Knoke told Evelena Peat that he can not stay home with her because he has other things to do and other obligations.  Evelena Peat stated that the patient is not in a safe situation at this time and would like a Education officer, museum to go out and do an evaluation. Evelena Peat also would like a verbal order for speech. Please call back with orders.

## 2016-02-03 ENCOUNTER — Other Ambulatory Visit: Payer: Self-pay | Admitting: *Deleted

## 2016-02-03 DIAGNOSIS — F438 Other reactions to severe stress: Secondary | ICD-10-CM | POA: Diagnosis not present

## 2016-02-03 DIAGNOSIS — I69311 Memory deficit following cerebral infarction: Secondary | ICD-10-CM | POA: Diagnosis not present

## 2016-02-03 NOTE — Progress Notes (Signed)
Carelink Summary Report / Loop Recorder 

## 2016-02-03 NOTE — Patient Outreach (Signed)
[facility] Customer service managerHealthCare Network [facility](THN) Care Management [facility] Community CM Telephone Outreach, Care Coordination 02/03/2016  Sherri BirkCreola S [name] 08/07/1940 [id number]  Secure communication via EMR received from Sherri [name], PCP for [name], 75 y/o female, previously active with and well-known to [facility] Community CM, regarding patient's current home health services.  Dr. Dayton [name] had received call from [facility] Sher[name], making her aware of current patient situation, and routed note to me as FYI.  I made Dr. Dayton [name] aware that I had spoken with [facility]ri [name], [facility] clinical supervisor yesterday in care coordination efforts, and would reach out to Sherri [name] as well.  I placed a follow up call to ClinchcoJesse, home health [facility] from [facility] [phone]) to assess for care coordination needs;  Sherri [name] reported concerns for patient's medication management and family dynamic issues around same.  Sherri [name] stated that he had been in contact with Sherri [name]'s brother in [place], who is not on [facility] CM written consent.  Sherri [name] reported that patient's brother had shared several environmental safety concerns about patient's living situation, where her husband is not present on a regular basis to assist with her care.  Sherri [name] stated that patient's brother did NOT wish for her to be placed in any type of assisted living or skilled nursing facility despite his stated concerns, however, patient's husband does continue to want placement away from patient's home environment.  I explained to Sherri [name] that I could not reach out to patient's brother, as he is not on [facility] consent, and updated him on previous [facility] CM involvement in patient's care where [facility] CSW and pharmacy were consulted in an effort to mitigate patient's medication management issues.  At that time, patient refused [facility] pharmacy assistance with arranging blister-pack delivery of her medications.  I also updated Sherri [name] that [facility] CSW consultation confirmed that if patient's husband wished  to have her declared incompetent and placed in a facility away from her home that he should act through court system, which was shared with patient's husband at the time of [facility] CM involvement in her care.  Sherri [name] stated that he has gotten CSW through [facility] involved in patient's care, and declined the need for additional [facility] CM involvement at this time, since previous efforts to address her medication management/ self-health management were refused by patient.  I provided Sherri [name] with my direct phone number should he have care coordination needs for patient in the future.  Plan:  Will make Dr. Dayton [name] aware of telephone outreach today to [facility] nurse [place].   Sherri PinaLaine Mckinney Jiaire Rosebrook, RN, BSN, [employer] Community Care Coordinator [facility] Care Management  [phone]

## 2016-02-03 NOTE — Telephone Encounter (Signed)
Spoke to [place]ie and provided verbal orders as requested.

## 2016-02-04 DIAGNOSIS — F438 Other reactions to severe stress: Secondary | ICD-10-CM | POA: Diagnosis not present

## 2016-02-04 DIAGNOSIS — I69311 Memory deficit following cerebral infarction: Secondary | ICD-10-CM | POA: Diagnosis not present

## 2016-02-05 ENCOUNTER — Telehealth: Payer: Self-pay | Admitting: Family Medicine

## 2016-02-05 NOTE — Telephone Encounter (Signed)
Kim from [facility] called - she wants to do the speech evaluation next week instead of this week.  cb numb is [phone]

## 2016-02-05 NOTE — Telephone Encounter (Signed)
Spoke to Sprint Nextel CorporationKim and provided verbal order as requested

## 2016-02-05 NOTE — Telephone Encounter (Signed)
Ok to give verbal order as requested. 

## 2016-02-06 ENCOUNTER — Telehealth: Payer: Self-pay

## 2016-02-06 NOTE — Telephone Encounter (Signed)
Noted, If worsens over the holiday weekend, I would take her to the ER

## 2016-02-06 NOTE — Telephone Encounter (Signed)
Sherri [name], [facility] Social Worker, calls to report to Dr. that patient experienced high levels of agitation, irrational thoughts, paranoia, and forgetfulness during their visit this week.  She highly suspects some dementia and will be performing a SLUMS test next week.  SW encouraged husband to call and report his concerns and observations to Dr. As well.

## 2016-02-10 DIAGNOSIS — F438 Other reactions to severe stress: Secondary | ICD-10-CM | POA: Diagnosis not present

## 2016-02-10 DIAGNOSIS — I69311 Memory deficit following cerebral infarction: Secondary | ICD-10-CM | POA: Diagnosis not present

## 2016-02-12 ENCOUNTER — Telehealth: Payer: Self-pay | Admitting: Cardiology

## 2016-02-12 ENCOUNTER — Telehealth: Payer: Self-pay | Admitting: Family Medicine

## 2016-02-12 DIAGNOSIS — I69311 Memory deficit following cerebral infarction: Secondary | ICD-10-CM | POA: Diagnosis not present

## 2016-02-12 DIAGNOSIS — Z638 Other specified problems related to primary support group: Secondary | ICD-10-CM

## 2016-02-12 DIAGNOSIS — F438 Other reactions to severe stress: Secondary | ICD-10-CM | POA: Diagnosis not present

## 2016-02-12 DIAGNOSIS — N189 Chronic kidney disease, unspecified: Secondary | ICD-10-CM | POA: Diagnosis not present

## 2016-02-12 DIAGNOSIS — Z79899 Other long term (current) drug therapy: Secondary | ICD-10-CM

## 2016-02-12 DIAGNOSIS — Z7982 Long term (current) use of aspirin: Secondary | ICD-10-CM

## 2016-02-12 DIAGNOSIS — I129 Hypertensive chronic kidney disease with stage 1 through stage 4 chronic kidney disease, or unspecified chronic kidney disease: Secondary | ICD-10-CM | POA: Diagnosis not present

## 2016-02-12 DIAGNOSIS — Z9181 History of falling: Secondary | ICD-10-CM

## 2016-02-12 DIAGNOSIS — Z9114 Patient's other noncompliance with medication regimen: Secondary | ICD-10-CM

## 2016-02-12 DIAGNOSIS — E1122 Type 2 diabetes mellitus with diabetic chronic kidney disease: Secondary | ICD-10-CM | POA: Diagnosis not present

## 2016-02-12 NOTE — Telephone Encounter (Signed)
Spoke w/ pt and requested that she send a manual transmission b/c her home monitor has not updated in at least 14 days.   

## 2016-02-12 NOTE — Telephone Encounter (Signed)
Mady GemmaJanice Braxton dropped off an evaluation of pt and is recommending her for dementia diagnosis. I placed eval on the cart with Janice's card.

## 2016-02-13 DIAGNOSIS — F438 Other reactions to severe stress: Secondary | ICD-10-CM | POA: Diagnosis not present

## 2016-02-13 DIAGNOSIS — I69311 Memory deficit following cerebral infarction: Secondary | ICD-10-CM | POA: Diagnosis not present

## 2016-02-16 ENCOUNTER — Telehealth: Payer: Self-pay | Admitting: Family Medicine

## 2016-02-16 NOTE — Telephone Encounter (Signed)
Kim called to get order for 4 st visits for cognitive communication deficient

## 2016-02-16 NOTE — Telephone Encounter (Signed)
Ok to give verbal order as requested. 

## 2016-02-17 NOTE — Telephone Encounter (Signed)
Spoke to Sprint Nextel CorporationKim and provided verbal orders as requested

## 2016-02-18 DIAGNOSIS — I69311 Memory deficit following cerebral infarction: Secondary | ICD-10-CM | POA: Diagnosis not present

## 2016-02-18 DIAGNOSIS — F438 Other reactions to severe stress: Secondary | ICD-10-CM | POA: Diagnosis not present

## 2016-02-27 DIAGNOSIS — I69311 Memory deficit following cerebral infarction: Secondary | ICD-10-CM | POA: Diagnosis not present

## 2016-02-27 DIAGNOSIS — F438 Other reactions to severe stress: Secondary | ICD-10-CM | POA: Diagnosis not present

## 2016-02-28 LAB — CUP PACEART REMOTE DEVICE CHECK: MDC IDC SESS DTM: 20170828204010

## 2016-02-28 NOTE — Progress Notes (Signed)
Carelink summary report received. Battery status OK. Normal device function. No new symptom episodes, tachy episodes, brady, or pause episodes. No new AF episodes. Monthly summary reports and ROV/PRN 

## 2016-03-01 ENCOUNTER — Telehealth: Payer: Self-pay

## 2016-03-01 NOTE — Telephone Encounter (Signed)
Ok to give verbal order as requested. Please keep us updated.

## 2016-03-01 NOTE — Telephone Encounter (Signed)
Verbal order given  

## 2016-03-01 NOTE — Telephone Encounter (Signed)
Sherri [name] with [facility] left v/m; Sherri NixonJanice dropped off information for cognitive assessment and request status of info being reviewed. pts husband is planning to have pt placed due to pts inability to take care of herself and the refusal of pt to let her husband take care of her. Sherri NixonJanice request cb.

## 2016-03-01 NOTE — Telephone Encounter (Signed)
Brayton CavesJessie from [facility] states that patient refused home visit last week.  Would like to have an additional home visit authorized this week.  Call [place]ie at [phone] with verbal authorization.

## 2016-03-03 ENCOUNTER — Ambulatory Visit (INDEPENDENT_AMBULATORY_CARE_PROVIDER_SITE_OTHER): Payer: Medicare Other | Admitting: *Deleted

## 2016-03-03 DIAGNOSIS — I639 Cerebral infarction, unspecified: Secondary | ICD-10-CM | POA: Diagnosis not present

## 2016-03-04 ENCOUNTER — Telehealth: Payer: Self-pay | Admitting: *Deleted

## 2016-03-04 NOTE — Telephone Encounter (Signed)
PTs brother came in concerned about a medicine his sister is supposed to be taking according to someone who came to her house and brought some pills telling her to take them at bed time. Please call him [phone]. He dropped the "pill" off. He had it in his shirt pocket and it got squished.

## 2016-03-04 NOTE — Progress Notes (Signed)
Carelink Summary Report / Loop Recorder 

## 2016-03-05 DIAGNOSIS — I69311 Memory deficit following cerebral infarction: Secondary | ICD-10-CM | POA: Diagnosis not present

## 2016-03-05 DIAGNOSIS — F438 Other reactions to severe stress: Secondary | ICD-10-CM | POA: Diagnosis not present

## 2016-03-05 NOTE — Telephone Encounter (Signed)
Lm on pts brothers vm requesting a call back

## 2016-03-09 NOTE — Telephone Encounter (Signed)
Lm on pts vm requesting a call back 

## 2016-03-11 DIAGNOSIS — I69311 Memory deficit following cerebral infarction: Secondary | ICD-10-CM | POA: Diagnosis not present

## 2016-03-11 DIAGNOSIS — F438 Other reactions to severe stress: Secondary | ICD-10-CM | POA: Diagnosis not present

## 2016-03-23 ENCOUNTER — Other Ambulatory Visit: Payer: Self-pay | Admitting: Family Medicine

## 2016-03-23 ENCOUNTER — Ambulatory Visit (INDEPENDENT_AMBULATORY_CARE_PROVIDER_SITE_OTHER): Payer: Medicare Other | Admitting: Family Medicine

## 2016-03-23 ENCOUNTER — Ambulatory Visit (INDEPENDENT_AMBULATORY_CARE_PROVIDER_SITE_OTHER): Payer: Medicare Other

## 2016-03-23 VITALS — BP 152/90 | HR 96 | Temp 97.5°F | Ht 65.0 in | Wt 141.2 lb

## 2016-03-23 DIAGNOSIS — I10 Essential (primary) hypertension: Secondary | ICD-10-CM

## 2016-03-23 DIAGNOSIS — Z23 Encounter for immunization: Secondary | ICD-10-CM | POA: Diagnosis not present

## 2016-03-23 DIAGNOSIS — F03918 Unspecified dementia, unspecified severity, with other behavioral disturbance: Secondary | ICD-10-CM | POA: Insufficient documentation

## 2016-03-23 DIAGNOSIS — I639 Cerebral infarction, unspecified: Secondary | ICD-10-CM | POA: Diagnosis not present

## 2016-03-23 DIAGNOSIS — F0390 Unspecified dementia without behavioral disturbance: Secondary | ICD-10-CM | POA: Diagnosis not present

## 2016-03-23 DIAGNOSIS — Z0000 Encounter for general adult medical examination without abnormal findings: Secondary | ICD-10-CM | POA: Diagnosis not present

## 2016-03-23 DIAGNOSIS — E1159 Type 2 diabetes mellitus with other circulatory complications: Secondary | ICD-10-CM

## 2016-03-23 DIAGNOSIS — E785 Hyperlipidemia, unspecified: Secondary | ICD-10-CM | POA: Diagnosis not present

## 2016-03-23 DIAGNOSIS — F0391 Unspecified dementia with behavioral disturbance: Secondary | ICD-10-CM | POA: Insufficient documentation

## 2016-03-23 DIAGNOSIS — N189 Chronic kidney disease, unspecified: Secondary | ICD-10-CM

## 2016-03-23 LAB — LIPID PANEL
CHOL/HDL RATIO: 3
Cholesterol: 231 mg/dL — ABNORMAL HIGH (ref 0–200)
HDL: 66.2 mg/dL (ref >39.00)
LDL CALC: 143 mg/dL — AB (ref 0–99)
NONHDL: 164.46
Triglycerides: 109 mg/dL (ref 0.0–149.0)
VLDL: 21.8 mg/dL (ref 0.0–40.0)

## 2016-03-23 LAB — CBC WITH DIFFERENTIAL/PLATELET
BASOS PCT: 0.5 % (ref 0.0–3.0)
Basophils Absolute: 0 10*3/uL (ref 0.0–0.1)
EOS ABS: 0.1 10*3/uL (ref 0.0–0.7)
Eosinophils Relative: 2 % (ref 0.0–5.0)
HCT: 39.1 % (ref 36.0–46.0)
HEMOGLOBIN: 12.7 g/dL (ref 12.0–15.0)
LYMPHS ABS: 1.5 10*3/uL (ref 0.7–4.0)
Lymphocytes Relative: 25.2 % (ref 12.0–46.0)
MCHC: 32.5 g/dL (ref 30.0–36.0)
MCV: 90.2 fl (ref 78.0–100.0)
Monocytes Absolute: 0.3 10*3/uL (ref 0.1–1.0)
Monocytes Relative: 5.1 % (ref 3.0–12.0)
NEUTROS PCT: 67.2 % (ref 43.0–77.0)
Neutro Abs: 4.1 10*3/uL (ref 1.4–7.7)
PLATELETS: 215 10*3/uL (ref 150.0–400.0)
RBC: 4.34 Mil/uL (ref 3.87–5.11)
RDW: 14.9 % (ref 11.5–15.5)
WBC: 6 10*3/uL (ref 4.0–10.5)

## 2016-03-23 LAB — COMPREHENSIVE METABOLIC PANEL
ALBUMIN: 4.3 g/dL (ref 3.5–5.2)
ALT: 15 U/L (ref 0–35)
AST: 19 U/L (ref 0–37)
Alkaline Phosphatase: 83 U/L (ref 39–117)
BUN: 10 mg/dL (ref 6–23)
CHLORIDE: 108 meq/L (ref 96–112)
CO2: 32 meq/L (ref 19–32)
CREATININE: 1.13 mg/dL (ref 0.40–1.20)
Calcium: 10.2 mg/dL (ref 8.4–10.5)
GFR: 60.11 mL/min (ref >60.00)
GLUCOSE: 108 mg/dL — AB (ref 70–99)
POTASSIUM: 3.9 meq/L (ref 3.5–5.1)
SODIUM: 147 meq/L — AB (ref 135–145)
Total Bilirubin: 0.5 mg/dL (ref 0.2–1.2)
Total Protein: 7.6 g/dL (ref 6.0–8.3)

## 2016-03-23 LAB — TSH: TSH: 1.42 u[IU]/mL (ref 0.35–4.50)

## 2016-03-23 NOTE — Patient Instructions (Signed)
[name] , Thank you for taking time to come for your Medicare Wellness Visit. I appreciate your ongoing commitment to your health goals. Please review the following plan we discussed and let me know if I can assist you in the future.   These are the goals we discussed: Goals    . Decrease the likelihood of falling          Starting 03/23/2016, I will use walker as needed to reduce risk of falling.        This is a list of the screening recommended for you and due dates:  Health Maintenance  Topic Date Due  . Eye exam for diabetics  06/06/2016*  . Mammogram  03/23/2017*  . DEXA scan (bone density measurement)  03/23/2017*  . Tetanus Vaccine  03/23/2017*  . Hemoglobin A1C  09/21/2016  . Flu Shot  Completed  . Shingles Vaccine  Addressed  . Pneumonia vaccines  Completed  *Topic was postponed. The date shown is not the original due date.   Preventive Care for Adults  A healthy lifestyle and preventive care can promote health and wellness. Preventive health guidelines for adults include the following key practices.  . A routine yearly physical is a good way to check with your health care provider about your health and preventive screening. It is a chance to share any concerns and updates on your health and to receive a thorough exam.  . Visit your dentist for a routine exam and preventive care every 6 months. Brush your teeth twice a day and floss once a day. Good oral hygiene prevents tooth decay and gum disease.  . The frequency of eye exams is based on your age, health, family medical history, use  of contact lenses, and other factors. Follow your health care provider's ecommendations for frequency of eye exams.  . Eat a healthy diet. Foods like vegetables, fruits, whole grains, low-fat dairy products, and lean protein foods contain the nutrients you need without too many calories. Decrease your intake of foods high in solid fats, added sugars, and salt. Eat the right amount of  calories for you. Get information about a proper diet from your health care provider, if necessary.  . Regular physical exercise is one of the most important things you can do for your health. Most adults should get at least 150 minutes of moderate-intensity exercise (any activity that increases your heart rate and causes you to sweat) each week. In addition, most adults need muscle-strengthening exercises on 2 or more days a week.  Silver Sneakers may be a benefit available to you. To determine eligibility, you may visit the website: www.silversneakers.com or contact program at [phone] Mon-Fri between 8AM-8PM.   . Maintain a healthy weight. The body mass index (BMI) is a screening tool to identify possible weight problems. It provides an estimate of body fat based on height and weight. Your health care provider can find your BMI and can help you achieve or maintain a healthy weight.   For adults 20 years and older: ? A BMI below 18.5 is considered underweight. ? A BMI of 18.5 to 24.9 is normal. ? A BMI of 25 to 29.9 is considered overweight. ? A BMI of 30 and above is considered obese.   . Maintain normal blood lipids and cholesterol levels by exercising and minimizing your intake of saturated fat. Eat a balanced diet with plenty of fruit and vegetables. Blood tests for lipids and cholesterol should begin at age 76 and be  repeated every 5 years. If your lipid or cholesterol levels are high, you are over 50, or you are at high risk for heart disease, you may need your cholesterol levels checked more frequently. Ongoing high lipid and cholesterol levels should be treated with medicines if diet and exercise are not working.  . If you smoke, find out from your health care provider how to quit. If you do not use tobacco, please do not start.  . If you choose to drink alcohol, please do not consume more than 2 drinks per day. One drink is considered to be 12 ounces (355 mL) of beer, 5 ounces  (148 mL) of wine, or 1.5 ounces (44 mL) of liquor.  . If you are 57-66 years old, ask your health care provider if you should take aspirin to prevent strokes.  . Use sunscreen. Apply sunscreen liberally and repeatedly throughout the day. You should seek shade when your shadow is shorter than you. Protect yourself by wearing long sleeves, pants, a wide-brimmed hat, and sunglasses year round, whenever you are outdoors.  . Once a month, do a whole body skin exam, using a mirror to look at the skin on your back. Tell your health care provider of new moles, moles that have irregular borders, moles that are larger than a pencil eraser, or moles that have changed in shape or color.

## 2016-03-23 NOTE — Progress Notes (Signed)
PCP notes:   Health maintenance:  Bone density - pt will discuss with PCP at CPE Mammogram - pt will discuss with PCP at CPE Flu vaccine - administered PPSV23- administered  Abnormal screenings:   Fall risk - hx of fall with injury  Patient concerns:   Pt has area on inside of right lower leg in which skin has darkened and become itchy. PCP notified.  Nurse concerns:  Patient's brother, Mr. Katrinka BlazingSmith, accompanied patient in encounter. Per Mr. Katrinka BlazingSmith, patient is not eating healthy meals. Provided Mr. Katrinka BlazingSmith with info on [place]els [place].  Next PCP appt:   03/23/16 @ 1215

## 2016-03-23 NOTE — Progress Notes (Signed)
Subjective:   Patient ID: Sherri [name], female    DOB: 01/21/1941, 75 y.o.   MRN: [id number]  Sherri [name] is a pleasant 75 y.o. year old female who presents to clinic today  for Follow-up  on 03/23/2016  HPI:  Annual medicare wellness visit with Lu Duffelarlesia Pinson, RN this morning.  HTN-  Restarted norvasc 10 mg daily at  [facility] on 08/25/15.   BP Readings from Last 3 Encounters:  03/23/16 (!) 152/90  03/23/16 (!) 152/90  01/26/16 (!) 160/88    Had a home health RN to help with her medications which has been helpful and her brother feels she needs a new referral for this service now that she is even more home bound.  Dementia is also more signifcant.  Although encouraged to check BP at home by myself and Ricke HeyLaine Tousey, RN, she has not been doing so routinely.  Home situation is still difficult.  She denies feeling like she is unsafe at home.   Lab Results  Component Value Date   LDLCALC 49 08/04/2015   Lab Results  Component Value Date   HGBA1C 5.7 (H) 08/04/2015   Lab Results  Component Value Date   CREATININE 1.18 (H) 08/04/2015   Current Outpatient Prescriptions on File Prior to Visit  Medication Sig Dispense Refill  . amLODipine (NORVASC) 10 MG tablet TAKE 1 TABLET (10 MG TOTAL) BY MOUTH DAILY. 90 tablet 1  . aspirin EC 325 MG EC tablet Take 1 tablet (325 mg total) by mouth daily. 30 tablet 0  . Glucosamine-Chondroit-Vit C-Mn (GLUCOSAMINE 1500 COMPLEX) CAPS Take 1 capsule by mouth daily.     . mirtazapine (REMERON) 15 MG tablet TAKE 1 TABLET (15 MG TOTAL) BY MOUTH AT BEDTIME. 90 tablet 0  . Omega-3 Fatty Acids (FISH OIL) 1000 MG CAPS Take 1 capsule by mouth daily.      . rosuvastatin (CRESTOR) 5 MG tablet TAKE 1 TABLET (5 MG TOTAL) BY MOUTH DAILY. 90 tablet 0   No current facility-administered medications on file prior to visit.     Allergies  Allergen Reactions  . Hydrogen Peroxide Swelling  . Sulfa Antibiotics     Past Medical History:  Diagnosis Date    . Diabetes mellitus without complication (HCC)   . Hyperlipidemia   . Hypertension     Past Surgical History:  Procedure Laterality Date  . EP IMPLANTABLE DEVICE N/A 08/06/2015   Procedure: Loop Recorder Insertion;  Surgeon: Hillis RangeJames Allred, MD;  Location: [facility];  Service: Cardiovascular;  Laterality: N/A;  . TEE WITHOUT CARDIOVERSION N/A 08/06/2015   Procedure: TRANSESOPHAGEAL ECHOCARDIOGRAM (TEE);  Surgeon: Lars MassonKatarina H Nelson, MD;  [facility];  Service: Cardiovascular;  Laterality: N/A;    No family history on file.  Social History   Social History  . Marital status: Married    Spouse name: N/A  . Number of children: N/A  . Years of education: N/A   Occupational History  . Not on file.   Social History Main Topics  . Smoking status: Never Smoker  . Smokeless tobacco: Never Used  . Alcohol use No  . Drug use: No  . Sexual activity: No   Other Topics Concern  . Not on file   Social History Narrative   Desires CPR.   Would not want prolonged life support if futile   The PMH, PSH, Social History, Family History, Medications, and allergies [facility], and have been updated if relevant.  Review of Systems  Constitutional: Negative.   HENT: Negative.   Eyes: Negative.   Respiratory: Negative.   Cardiovascular: Negative.   Gastrointestinal: Negative.   Endocrine: Negative.   Genitourinary: Negative.   Musculoskeletal: Negative.   Skin: Negative.   Neurological: Negative.   Hematological: Negative.   Psychiatric/Behavioral: Negative.   All other systems reviewed and are negative.      Objective:    BP (!) 152/90 (BP Location: Right Arm, Patient Position: Sitting, Cuff Size: Normal)   Pulse 73   Temp 97.5 F (36.4 C) (Oral)   Ht 5\' 5"  (1.651 m) Comment: no shoes  Wt 141 lb 4 oz (64.1 kg)   SpO2 96%   BMI 23.51 kg/m    Physical Exam  Constitutional: She is oriented to person, place, and time. She appears  well-developed and well-nourished. No distress.  HENT:  Head: Normocephalic.  Eyes: Conjunctivae are normal.  Neck: Normal range of motion.  Cardiovascular: Normal rate.   Pulmonary/Chest: Effort normal.  Musculoskeletal: Normal range of motion.  Neurological: She is alert and oriented to person, place, and time.  Walking with more steadiness today  Skin: Skin is warm and dry.  Psychiatric: She has a normal mood and affect. Her behavior is normal. Judgment normal.  Nursing note and vitals reviewed.         Assessment & Plan:   Dementia without behavioral disturbance, unspecified dementia type - Plan: Ambulatory referral to Home Health  Medicare annual wellness visit, subsequent - Plan: CBC with Differential/Platelet, Comprehensive metabolic panel, Lipid panel, TSH No Follow-up on file.

## 2016-03-23 NOTE — Progress Notes (Signed)
Pre visit review using our clinic review tool, if applicable. No additional management support is needed unless otherwise documented below in the visit note. 

## 2016-03-23 NOTE — Patient Instructions (Signed)
Good to see you. Please stop by to see Marion on your way out. 

## 2016-03-23 NOTE — Assessment & Plan Note (Signed)
Reasonable control. No changes made. 

## 2016-03-23 NOTE — Assessment & Plan Note (Signed)
Labs today

## 2016-03-23 NOTE — Assessment & Plan Note (Signed)
Mild to moderate, has progressed some. No rx currently.   New referral placed to help with med management.

## 2016-03-23 NOTE — Progress Notes (Signed)
Subjective:   Sherri [name] is a 75 y.o. female who presents for Medicare Annual (Subsequent) preventive examination.  Review of Systems:  N/A Cardiac Risk Factors include: advanced age (>23men, >75 women);dyslipidemia;hypertension     Objective:     Vitals: BP (!) 152/90 (BP Location: Right Arm, Patient Position: Sitting, Cuff Size: Normal)   Pulse 96   Temp 97.5 F (36.4 C) (Oral)   Ht  (1.651 m) Comment: no shoes  Wt 141 lb 4 oz (64.1 kg)   SpO2 (!) 73%   BMI 23.51 kg/m   Body mass index is 23.51 kg/m.   Tobacco History  Smoking Status  . Never Smoker  Smokeless Tobacco  . Never Used     Counseling given: No   Past Medical History:  Diagnosis Date  . Diabetes mellitus without complication (HCC)   . Hyperlipidemia   . Hypertension    Past Surgical History:  Procedure Laterality Date  . EP IMPLANTABLE DEVICE N/A 08/06/2015   Procedure: Loop Recorder Insertion;  Surgeon: Hillis Range, MD;  Location: [facility];  Service: Cardiovascular;  Laterality: N/A;  . TEE WITHOUT CARDIOVERSION N/A 08/06/2015   Procedure: TRANSESOPHAGEAL ECHOCARDIOGRAM (TEE);  Surgeon: Lars Masson, MD;  Location: [facility];  Service: Cardiovascular;  Laterality: N/A;   History reviewed. No pertinent family history. History  Sexual Activity  . Sexual activity: No    Outpatient Encounter Prescriptions as of 03/23/2016  Medication Sig  . amLODipine (NORVASC) 10 MG tablet TAKE 1 TABLET (10 MG TOTAL) BY MOUTH DAILY.  Marland Kitchen aspirin EC 325 MG EC tablet Take 1 tablet (325 mg total) by mouth daily.  . Glucosamine-Chondroit-Vit C-Mn (GLUCOSAMINE 1500 COMPLEX) CAPS Take 1 capsule by mouth daily.   . mirtazapine (REMERON) 15 MG tablet TAKE 1 TABLET (15 MG TOTAL) BY MOUTH AT BEDTIME.  Marland Kitchen Omega-3 Fatty Acids (FISH OIL) 1000 MG CAPS Take 1 capsule by mouth daily.    . rosuvastatin (CRESTOR) 5 MG tablet TAKE 1 TABLET (5 MG TOTAL) BY MOUTH DAILY.   No facility-administered  encounter medications on file as of 03/23/2016.     Activities of Daily Living In your present state of health, do you have any difficulty performing the following activities: 03/23/2016 08/28/2015  Hearing? N N  Vision? Y N  Difficulty concentrating or making decisions? Malvin Johns  Walking or climbing stairs? Y Y  Dressing or bathing? N N  Doing errands, shopping? Malvin Johns  Preparing Food and eating ? Y N  Using the Toilet? N N  In the past six months, have you accidently leaked urine? Y N  Do you have problems with loss of bowel control? N N  Managing your Medications? Y Y  Managing your Finances? Malvin Johns  Housekeeping or managing your Housekeeping? N Y  Some recent data might be hidden    Patient Care Team: Dianne Dun, MD as PCP - General (Family Medicine) Nelida Gores, MD as Referring Physician (Cardiology)    Assessment:     Visual Acuity Screening   Right eye Left eye Both eyes  Without correction:  With correction:     Hearing will be assessed at CPE  Exercise Activities and Dietary recommendations Current Exercise Habits: The patient does not participate in regular exercise at present, Exercise limited by: neurologic condition(s);orthopedic condition(s)  Goals    . Decrease the likelihood of falling          Starting 03/23/2016, I will  use walker as needed to reduce risk of falling.       Fall Risk Fall Risk  03/23/2016 09/24/2015 08/28/2015 08/18/2015 08/15/2015  Falls in the past year? Yes No No No No  Number falls in past yr: 1 - - - -  Injury with Fall? Yes - - - -  Risk for fall due to : Impaired balance/gait;Impaired vision - Impaired balance/gait;Impaired mobility Impaired balance/gait;Impaired mobility -  Follow up Falls evaluation completed - - - -   Depression Screen PHQ 2/9 Scores 03/23/2016 08/28/2015 08/18/2015 08/11/2015  PHQ - 2 Score 0 0 0 0     Cognitive Testing MMSE - Mini Mental State Exam 03/23/2016  Not completed: Hx of stroke     Immunization History  Administered Date(s) Administered  . Influenza Split 04/12/2011, 02/24/2012  . Influenza,inj,Quad PF,36+ Mos 03/15/2013, 03/13/2014, 03/24/2015, 03/23/2016  . Pneumococcal Conjugate-13 03/13/2014  . Pneumococcal Polysaccharide-23 03/23/2016   Screening Tests Health Maintenance  Topic Date Due  . OPHTHALMOLOGY EXAM  06/06/2016 (Originally 08/13/1950)  . MAMMOGRAM  03/23/2017 (Originally 11/13/2015)  . DEXA SCAN  03/23/2017 (Originally 08/13/2004)  . TETANUS/TDAP  03/23/2017 (Originally 08/14/1958)  . HEMOGLOBIN A1C  09/21/2016  . INFLUENZA VACCINE  Completed  . ZOSTAVAX  Addressed  . PNA vac Low Risk Adult  Completed      Plan:  I have personally reviewed and addressed the Medicare Annual Wellness questionnaire and have noted the following in the patient's chart:  A. Medical and social history B. Use of alcohol, tobacco or illicit drugs  C. Current medications and supplements D. Functional ability and status E.  Nutritional status F.  Physical activity G. Advance directives H. List of other physicians I.  Hospitalizations, surgeries, and ER visits in previous 12 months J.  Vitals K. Screenings to include hearing, vision, cognitive, depression L. Referrals and appointments - none  In addition, I have reviewed and discussed with patient certain preventive protocols, quality metrics, and best practice recommendations. A written personalized care plan for preventive services as well as general preventive health recommendations were provided to patient.  See attached scanned questionnaire for additional information.   Signed,   Randa Evens, MHA, BS, LPN Health Coach

## 2016-03-24 NOTE — Progress Notes (Signed)
I reviewed health advisor's note, was available for consultation, and agree with documentation and plan.  

## 2016-03-26 ENCOUNTER — Emergency Department (HOSPITAL_COMMUNITY)
Admission: EM | Admit: 2016-03-26 | Discharge: 2016-03-26 | Disposition: A | Discharge status: Home / Self Care | Payer: Medicare Other | Attending: Emergency Medicine | Admitting: Emergency Medicine

## 2016-03-26 ENCOUNTER — Encounter (HOSPITAL_COMMUNITY): Payer: Self-pay | Admitting: Emergency Medicine

## 2016-03-26 DIAGNOSIS — Z7982 Long term (current) use of aspirin: Secondary | ICD-10-CM | POA: Insufficient documentation

## 2016-03-26 DIAGNOSIS — Z8673 Personal history of transient ischemic attack (TIA), and cerebral infarction without residual deficits: Secondary | ICD-10-CM | POA: Diagnosis not present

## 2016-03-26 DIAGNOSIS — E1122 Type 2 diabetes mellitus with diabetic chronic kidney disease: Secondary | ICD-10-CM | POA: Diagnosis not present

## 2016-03-26 DIAGNOSIS — E119 Type 2 diabetes mellitus without complications: Secondary | ICD-10-CM | POA: Insufficient documentation

## 2016-03-26 DIAGNOSIS — I129 Hypertensive chronic kidney disease with stage 1 through stage 4 chronic kidney disease, or unspecified chronic kidney disease: Secondary | ICD-10-CM | POA: Insufficient documentation

## 2016-03-26 DIAGNOSIS — R1033 Periumbilical pain: Secondary | ICD-10-CM | POA: Insufficient documentation

## 2016-03-26 DIAGNOSIS — N189 Chronic kidney disease, unspecified: Secondary | ICD-10-CM | POA: Diagnosis not present

## 2016-03-26 DIAGNOSIS — K2970 Gastritis, unspecified, without bleeding: Secondary | ICD-10-CM | POA: Diagnosis not present

## 2016-03-26 DIAGNOSIS — R1111 Vomiting without nausea: Secondary | ICD-10-CM | POA: Diagnosis not present

## 2016-03-26 LAB — URINALYSIS, ROUTINE W REFLEX MICROSCOPIC
BILIRUBIN URINE: NEGATIVE
GLUCOSE, UA: NEGATIVE mg/dL
HGB URINE DIPSTICK: NEGATIVE
Ketones, ur: NEGATIVE mg/dL
Nitrite: NEGATIVE
PROTEIN: 30 mg/dL — AB
SPECIFIC GRAVITY, URINE: 1.009 (ref 1.005–1.030)
pH: 7.5 (ref 5.0–8.0)

## 2016-03-26 LAB — COMPREHENSIVE METABOLIC PANEL
ALBUMIN: 3.6 g/dL (ref 3.5–5.0)
ALK PHOS: 69 U/L (ref 38–126)
ALT: 21 U/L (ref 14–54)
AST: 42 U/L — AB (ref 15–41)
Anion gap: 5 (ref 5–15)
BUN: 11 mg/dL (ref 6–20)
CALCIUM: 9 mg/dL (ref 8.9–10.3)
CO2: 26 mmol/L (ref 22–32)
CREATININE: 1.12 mg/dL — AB (ref 0.44–1.00)
Chloride: 109 mmol/L (ref 101–111)
GFR calc Af Amer: 54 mL/min — ABNORMAL LOW (ref >60)
GFR calc non Af Amer: 46 mL/min — ABNORMAL LOW (ref >60)
GLUCOSE: 115 mg/dL — AB (ref 65–99)
Potassium: 5.7 mmol/L — ABNORMAL HIGH (ref 3.5–5.1)
Sodium: 140 mmol/L (ref 135–145)
Total Bilirubin: 1.7 mg/dL — ABNORMAL HIGH (ref 0.3–1.2)
Total Protein: 6.1 g/dL — ABNORMAL LOW (ref 6.5–8.1)

## 2016-03-26 LAB — POTASSIUM: POTASSIUM: 3.5 mmol/L (ref 3.5–5.1)

## 2016-03-26 LAB — CBC
HCT: 38.1 % (ref 36.0–46.0)
Hemoglobin: 12.2 g/dL (ref 12.0–15.0)
MCH: 28.9 pg (ref 26.0–34.0)
MCHC: 32 g/dL (ref 30.0–36.0)
MCV: 90.3 fL (ref 78.0–100.0)
PLATELETS: 200 10*3/uL (ref 150–400)
RBC: 4.22 MIL/uL (ref 3.87–5.11)
RDW: 14.5 % (ref 11.5–15.5)
WBC: 5.1 10*3/uL (ref 4.0–10.5)

## 2016-03-26 LAB — URINE MICROSCOPIC-ADD ON

## 2016-03-26 LAB — LIPASE, BLOOD: Lipase: 29 U/L (ref 11–51)

## 2016-03-26 MED ORDER — ONDANSETRON HCL 4 MG/2ML IJ SOLN
4.0000 mg | Freq: Once | INTRAMUSCULAR | Status: AC
  Administered 2016-03-26: 4 mg via INTRAVENOUS
  Filled 2016-03-26: qty 2

## 2016-03-26 NOTE — ED Triage Notes (Signed)
Pt brought in by EMS with c/o lower abd pain, cramping, and vomiting since this morning when she woke up.

## 2016-03-26 NOTE — ED Notes (Signed)
Patient ambulated to restroom with assistance.  Patient was a little unsteady when trying to walk.

## 2016-03-26 NOTE — ED Provider Notes (Signed)
ED ECG REPORT   Date: 03/26/2016 [id number]  Rate: 64  Rhythm: normal sinus rhythm  QRS Axis: normal  Intervals: QT prolonged  ST/T Wave abnormalities: nonspecific ST changes  Conduction Disutrbances:none  I have personally reviewed the EKG tracing and agree with the computerized printout as noted.    Zadie Rhineonald Margaretmary Prisk, MD 03/26/16 [phone]

## 2016-03-26 NOTE — ED Provider Notes (Signed)
[facility] Provider Note   CSN: [id number] Arrival date & time: 03/26/16  0500     History   Chief Complaint Chief Complaint  Patient presents with  . Abdominal Pain  . Emesis    HPI Sherri [name] CitizenMeachem is a 75 y.o. female.  Patient with no past surgical history presents with complaint of acute onset of periumbilical abdominal pain overnight. Symptoms associated with nonbloody vomiting. No diarrhea. Patient has had a normal bowel movement since. No treatments prior to arrival. No fevers, chest pain, shortness of breath. Pain does not radiate. No lower extremity swelling. No urinary symptoms. The onset of this condition was acute. The course is improved. Aggravating factors: none. Alleviating factors: none. No known sick contacts.        Past Medical History:  Diagnosis Date  . Diabetes mellitus without complication (HCC)   . Hyperlipidemia   . Hypertension     Patient Active Problem List   Diagnosis Date Noted  . Medicare annual wellness visit, subsequent 03/23/2016  . Dementia 03/23/2016  . Stress at home 01/26/2016  . History of TIA (transient ischemic attack)   . CKD (chronic kidney disease)   . Acute CVA (cerebrovascular accident) (HCC) 08/03/2015  . CVA (cerebral infarction) 08/03/2015  . Dizziness   . Encounter for medication counseling 07/03/2015  . Adjustment disorder with mixed anxiety and depressed mood 09/16/2014  . Hyperlipidemia   . Hypertension     Past Surgical History:  Procedure Laterality Date  . EP IMPLANTABLE DEVICE N/A 08/06/2015   Procedure: Loop Recorder Insertion;  Surgeon: Hillis RangeJames Allred, MD;  Location: [facility];  Service: Cardiovascular;  Laterality: N/A;  . TEE WITHOUT CARDIOVERSION N/A 08/06/2015   Procedure: TRANSESOPHAGEAL ECHOCARDIOGRAM (TEE);  Surgeon: Lars MassonKatarina H Nelson, MD;  [facility];  Service: Cardiovascular;  Laterality: N/A;    OB History    No data available       Home Medications    Prior  to Admission medications   Medication Sig Start Date End Date Taking? Authorizing Provider  amLODipine (NORVASC) 10 MG tablet TAKE 1 TABLET (10 MG TOTAL) BY MOUTH DAILY. 01/30/16  Yes Dianne Dunalia M Aron, MD  aspirin EC 325 MG EC tablet Take 1 tablet (325 mg total) by mouth daily. 08/07/15  Yes Penny Piarlando Vega, MD  Glucosamine-Chondroit-Vit C-Mn (GLUCOSAMINE 1500 COMPLEX) CAPS Take 1 capsule by mouth daily.    Yes Historical Provider, MD  mirtazapine (REMERON) 15 MG tablet TAKE 1 TABLET (15 MG TOTAL) BY MOUTH AT BEDTIME. 01/05/16  Yes Dianne Dunalia M Aron, MD  Omega-3 Fatty Acids (FISH OIL) 1000 MG CAPS Take 1 capsule by mouth daily.     Yes Historical Provider, MD  rosuvastatin (CRESTOR) 5 MG tablet TAKE 1 TABLET (5 MG TOTAL) BY MOUTH DAILY. 01/05/16  Yes Dianne Dunalia M Aron, MD    Family History History reviewed. No pertinent family history.  Social History Social History  Substance Use Topics  . Smoking status: Never Smoker  . Smokeless tobacco: Never Used  . Alcohol use No     Allergies   Hydrogen peroxide and Sulfa antibiotics   Review of Systems Review of Systems  Constitutional: Negative for fever.  HENT: Negative for rhinorrhea and sore throat.   Eyes: Negative for redness.  Respiratory: Negative for cough.   Cardiovascular: Negative for chest pain.  Gastrointestinal: Positive for abdominal pain, nausea and vomiting. Negative for diarrhea.  Genitourinary: Negative for dysuria.  Musculoskeletal: Negative for myalgias.  Skin: Negative for rash.  Neurological: Negative for headaches.     Physical Exam Updated Vital Signs BP 163/80   Pulse (!) 59   Temp 97.2 F (36.2 C) (Oral)   Resp 17   Ht 5\' 5"  (1.651 m)   Wt 64 kg   SpO2 94%   BMI 23.46 kg/m   Physical Exam  Constitutional: She appears well-developed and well-nourished.  HENT:  Head: Normocephalic and atraumatic.  Eyes: Conjunctivae are normal. Right eye exhibits no discharge. Left eye exhibits no discharge.  Neck: Normal range  of motion. Neck supple.  Cardiovascular: Normal rate, regular rhythm and normal heart sounds.   Pulmonary/Chest: Effort normal and breath sounds normal.  Abdominal: Soft. She exhibits no mass. There is no tenderness. There is no guarding.  Non-tender at time of exam.   Neurological: She is alert.  Skin: Skin is warm and dry.  Psychiatric: She has a normal mood and affect.  Nursing note and vitals reviewed.    ED Treatments / Results  Labs (all labs ordered are listed, but only abnormal results are displayed) Labs Reviewed  COMPREHENSIVE METABOLIC PANEL - Abnormal; Notable for the following:       Result Value   Potassium 5.7 (*)    Glucose, Bld 115 (*)    Creatinine, Ser 1.12 (*)    Total Protein 6.1 (*)    AST 42 (*)    Total Bilirubin 1.7 (*)    GFR calc non Af Amer 46 (*)    GFR calc Af Amer 54 (*)    All other components within normal limits  URINALYSIS, ROUTINE W REFLEX MICROSCOPIC (NOT AT [facility]) - Abnormal; Notable for the following:    Protein, ur 30 (*)    Leukocytes, UA TRACE (*)    All other components within normal limits  URINE MICROSCOPIC-ADD ON - Abnormal; Notable for the following:    Squamous Epithelial / LPF 6-30 (*)    Bacteria, UA FEW (*)    All other components within normal limits  URINE CULTURE  LIPASE, BLOOD  CBC  POTASSIUM    EKG  EKG Interpretation None       Procedures Procedures (including critical care time)  Medications Ordered in ED Medications  ondansetron (ZOFRAN) injection 4 mg (4 mg Intravenous Given 03/26/16 1610)     Initial Impression / Assessment and Plan / ED Course  I have reviewed the triage vital signs and the nursing notes.  Pertinent labs & imaging results that were available during my care of the patient were reviewed by me and considered in my medical decision making (see chart for details).  Clinical Course   Patient seen and examined. Work-up initiated. Medications ordered.   Vital signs reviewed and are  as follows: BP 163/80   Pulse (!) 59   Temp 97.2 F (36.2 C) (Oral)   Resp 17   Ht 5\' 5"  (1.651 m)   Wt 64 kg   SpO2 94%   BMI 23.46 kg/m   9:40 AM EKG reviewed. K rechecked and is normal. Pt discussed with and seen by Dr. Clarene Duke. She remains asymptomatic in emergency department. Do not feel the patient requires any imaging given benign exam and reassuring blood tests. Patient updated on results. Encouraged PCP follow-up as needed.  The patient was urged to return to the Emergency Department immediately with worsening of current symptoms, worsening abdominal pain, persistent vomiting, blood noted in stools, fever, or any other concerns. The patient verbalized understanding.    Final Clinical Impressions(s) /  ED Diagnoses   Final diagnoses:  Periumbilical abdominal pain   Patient with abdominal pain, Short-lived this morning, now completely resolved without any pain on exam. Patient is tolerating oral fluids.. Vitals are stable, no fever. Labs are reassuring. Imaging not deemed indicated. No signs of dehydration, patient is tolerating PO's. Lungs are clear and no signs suggestive of PNA. Low concern for appendicitis, cholecystitis, pancreatitis, ruptured viscus, UTI, kidney stone, aortic dissection, aortic aneurysm or other emergent abdominal etiology. Supportive therapy indicated with return if symptoms worsen.    New Prescriptions New Prescriptions   No medications on file     Renne Crigler, PA-C 03/26/16 1610    Laurence Spates, MD 03/27/16 [phone]

## 2016-03-26 NOTE — ED Notes (Signed)
Patient drank Sprite and was able to tolerate well.

## 2016-03-26 NOTE — Discharge Instructions (Signed)
Please read and follow all provided instructions.  Your diagnoses today include:  1. Periumbilical abdominal pain     Tests performed today include:  Blood counts and electrolytes  Blood tests to check liver and kidney function  Blood tests to check pancreas function  Urine test to look for infection, unclear if you have an infection. A culture was sent. If positive you will get a phone call with an antibiotic prescribed.   Vital signs. See below for your results today.   Medications prescribed:   None  Take any prescribed medications only as directed.  Home care instructions:   Follow any educational materials contained in this packet.  Follow-up instructions: Please follow-up with your primary care provider in the next 2 days for further evaluation of your symptoms.    Return instructions:  SEEK IMMEDIATE MEDICAL ATTENTION IF:  The pain does not go away or becomes severe   A temperature above 101F develops   Repeated vomiting occurs (multiple episodes)   The pain becomes localized to portions of the abdomen. The right side could possibly be appendicitis. In an adult, the left lower portion of the abdomen could be colitis or diverticulitis.   Blood is being passed in stools or vomit (bright red or black tarry stools)   You develop chest pain, difficulty breathing, dizziness or fainting, or become confused, poorly responsive, or inconsolable (young children)  If you have any other emergent concerns regarding your health  Additional Information: Abdominal (belly) pain can be caused by many things. Your caregiver performed an examination and possibly ordered blood/urine tests and imaging (CT scan, x-rays, ultrasound). Many cases can be observed and treated at home after initial evaluation in the emergency department. Even though you are being discharged home, abdominal pain can be unpredictable. Therefore, you need a repeated exam if your pain does not resolve, returns,  or worsens. Most patients with abdominal pain don't have to be admitted to the hospital or have surgery, but serious problems like appendicitis and gallbladder attacks can start out as nonspecific pain. Many abdominal conditions cannot be diagnosed in one visit, so follow-up evaluations are very important.  Your vital signs today were: BP 159/97 (BP Location: Right Arm)    Pulse 72    Temp 97.2 F (36.2 C) (Oral)    Resp 15    Ht 5\' 5"  (1.651 m)    Wt 64 kg    SpO2 99%    BMI 23.46 kg/m  If your blood pressure (bp) was elevated above 135/85 this visit, please have this repeated by your doctor within one month. --------------

## 2016-03-27 LAB — URINE CULTURE

## 2016-04-02 ENCOUNTER — Ambulatory Visit (INDEPENDENT_AMBULATORY_CARE_PROVIDER_SITE_OTHER): Payer: Medicare Other | Admitting: *Deleted

## 2016-04-02 DIAGNOSIS — I639 Cerebral infarction, unspecified: Secondary | ICD-10-CM | POA: Diagnosis not present

## 2016-04-05 NOTE — Progress Notes (Signed)
Carelink Summary Report / Loop Recorder 

## 2016-04-09 ENCOUNTER — Emergency Department (HOSPITAL_COMMUNITY): Payer: Medicare Other

## 2016-04-09 ENCOUNTER — Encounter (HOSPITAL_COMMUNITY): Payer: Self-pay | Admitting: Emergency Medicine

## 2016-04-09 ENCOUNTER — Emergency Department (HOSPITAL_COMMUNITY)
Admission: EM | Admit: 2016-04-09 | Discharge: 2016-04-09 | Disposition: A | Discharge status: Home / Self Care | Payer: Medicare Other | Attending: Emergency Medicine | Admitting: Emergency Medicine

## 2016-04-09 DIAGNOSIS — N189 Chronic kidney disease, unspecified: Secondary | ICD-10-CM | POA: Diagnosis not present

## 2016-04-09 DIAGNOSIS — R404 Transient alteration of awareness: Secondary | ICD-10-CM | POA: Diagnosis not present

## 2016-04-09 DIAGNOSIS — E1122 Type 2 diabetes mellitus with diabetic chronic kidney disease: Secondary | ICD-10-CM | POA: Diagnosis not present

## 2016-04-09 DIAGNOSIS — Y999 Unspecified external cause status: Secondary | ICD-10-CM | POA: Insufficient documentation

## 2016-04-09 DIAGNOSIS — Y939 Activity, unspecified: Secondary | ICD-10-CM | POA: Diagnosis not present

## 2016-04-09 DIAGNOSIS — F919 Conduct disorder, unspecified: Secondary | ICD-10-CM | POA: Diagnosis not present

## 2016-04-09 DIAGNOSIS — W19XXXA Unspecified fall, initial encounter: Secondary | ICD-10-CM

## 2016-04-09 DIAGNOSIS — R410 Disorientation, unspecified: Secondary | ICD-10-CM | POA: Insufficient documentation

## 2016-04-09 DIAGNOSIS — S0990XA Unspecified injury of head, initial encounter: Secondary | ICD-10-CM | POA: Diagnosis not present

## 2016-04-09 DIAGNOSIS — I129 Hypertensive chronic kidney disease with stage 1 through stage 4 chronic kidney disease, or unspecified chronic kidney disease: Secondary | ICD-10-CM | POA: Insufficient documentation

## 2016-04-09 DIAGNOSIS — Z7982 Long term (current) use of aspirin: Secondary | ICD-10-CM | POA: Insufficient documentation

## 2016-04-09 DIAGNOSIS — R42 Dizziness and giddiness: Secondary | ICD-10-CM | POA: Diagnosis present

## 2016-04-09 DIAGNOSIS — Z8673 Personal history of transient ischemic attack (TIA), and cerebral infarction without residual deficits: Secondary | ICD-10-CM | POA: Diagnosis not present

## 2016-04-09 DIAGNOSIS — W228XXA Striking against or struck by other objects, initial encounter: Secondary | ICD-10-CM | POA: Insufficient documentation

## 2016-04-09 DIAGNOSIS — S199XXA Unspecified injury of neck, initial encounter: Secondary | ICD-10-CM | POA: Diagnosis not present

## 2016-04-09 DIAGNOSIS — Y92015 Private garage of single-family (private) house as the place of occurrence of the external cause: Secondary | ICD-10-CM | POA: Insufficient documentation

## 2016-04-09 HISTORY — DX: Cerebral infarction, unspecified: I63.9

## 2016-04-09 LAB — CBC WITH DIFFERENTIAL/PLATELET
BASOS ABS: 0 10*3/uL (ref 0.0–0.1)
BASOS PCT: 0 %
Eosinophils Absolute: 0.1 10*3/uL (ref 0.0–0.7)
Eosinophils Relative: 2 %
HCT: 42.5 % (ref 36.0–46.0)
HEMOGLOBIN: 13.6 g/dL (ref 12.0–15.0)
LYMPHS PCT: 19 %
Lymphs Abs: 1.4 10*3/uL (ref 0.7–4.0)
MCH: 28.9 pg (ref 26.0–34.0)
MCHC: 32 g/dL (ref 30.0–36.0)
MCV: 90.2 fL (ref 78.0–100.0)
MONOS PCT: 4 %
Monocytes Absolute: 0.3 10*3/uL (ref 0.1–1.0)
NEUTROS ABS: 5.5 10*3/uL (ref 1.7–7.7)
NEUTROS PCT: 75 %
Platelets: 188 10*3/uL (ref 150–400)
RBC: 4.71 MIL/uL (ref 3.87–5.11)
RDW: 14.1 % (ref 11.5–15.5)
WBC: 7.3 10*3/uL (ref 4.0–10.5)

## 2016-04-09 LAB — URINALYSIS, ROUTINE W REFLEX MICROSCOPIC
Bilirubin Urine: NEGATIVE
Glucose, UA: NEGATIVE mg/dL
Hgb urine dipstick: NEGATIVE
Ketones, ur: NEGATIVE mg/dL
NITRITE: NEGATIVE
PH: 7.5 (ref 5.0–8.0)
Protein, ur: 30 mg/dL — AB
SPECIFIC GRAVITY, URINE: 1.006 (ref 1.005–1.030)

## 2016-04-09 LAB — BASIC METABOLIC PANEL
ANION GAP: 7 (ref 5–15)
BUN: 8 mg/dL (ref 6–20)
CALCIUM: 10 mg/dL (ref 8.9–10.3)
CO2: 26 mmol/L (ref 22–32)
Chloride: 112 mmol/L — ABNORMAL HIGH (ref 101–111)
Creatinine, Ser: 1.14 mg/dL — ABNORMAL HIGH (ref 0.44–1.00)
GFR, EST AFRICAN AMERICAN: 53 mL/min — AB (ref >60)
GFR, EST NON AFRICAN AMERICAN: 46 mL/min — AB (ref >60)
Glucose, Bld: 85 mg/dL (ref 65–99)
POTASSIUM: 4.4 mmol/L (ref 3.5–5.1)
Sodium: 145 mmol/L (ref 135–145)

## 2016-04-09 LAB — URINE MICROSCOPIC-ADD ON: RBC / HPF: NONE SEEN RBC/hpf (ref 0–5)

## 2016-04-09 LAB — CBG MONITORING, ED: Glucose-Capillary: 89 mg/dL (ref 65–99)

## 2016-04-09 MED ORDER — AMLODIPINE BESYLATE 5 MG PO TABS
10.0000 mg | ORAL_TABLET | Freq: Every day | ORAL | Status: DC
  Administered 2016-04-09: 10 mg via ORAL
  Filled 2016-04-09: qty 2

## 2016-04-09 MED ORDER — ROSUVASTATIN CALCIUM 10 MG PO TABS
5.0000 mg | ORAL_TABLET | Freq: Every day | ORAL | Status: DC
  Administered 2016-04-09: 5 mg via ORAL
  Filled 2016-04-09: qty 1

## 2016-04-09 MED ORDER — GLUCOSAMINE 1500 COMPLEX PO CAPS: 1.0000 | ORAL_CAPSULE | Freq: Every day | ORAL | Status: DC

## 2016-04-09 MED ORDER — ASPIRIN EC 325 MG PO TBEC
325.0000 mg | DELAYED_RELEASE_TABLET | Freq: Every day | ORAL | Status: DC
  Administered 2016-04-09: 325 mg via ORAL
  Filled 2016-04-09: qty 1

## 2016-04-09 NOTE — ED Provider Notes (Signed)
[facility]-EM[facility]Y DEPT Provider Note   CSN: [id number] Arrival date & time: 04/09/16  1009     History   Chief Complaint Chief Complaint  Patient presents with  . Fall    HPI Tashawnda Elizebeth KollerS Sabbagh is a 75 y.o. female.  The history is provided by the patient and the EMS personnel. No language interpreter was used.  Fall     Merridy Elizebeth KollerS Hoying is a 75 y.o. female who presents to the Emergency Department complaining of fall.  She presents by EMS for evaluation of injuries following a fall. She was going out to pick up the newspaper today when she decided to stop in the garage to look out the window before going outside. She climbed onto a small volar tilt of the window and she fell off the cooler. She denies any injuries in the fall. She was unable to get back up off the ground. She did not lose consciousness and does not think she hit her head. She told EMS that she had some headache and dizziness. She states these are resolved now. She denies any fevers, chest pain, shortness of breath, abdominal pain, nausea, vomiting, numbness, weakness. She states she has been in her routine set of health lately.  Past Medical History:  Diagnosis Date  . Diabetes mellitus without complication (HCC)   . Hyperlipidemia   . Hypertension   . Stroke [facility](HCC)     Patient Active Problem List   Diagnosis Date Noted  . Medicare annual wellness visit, subsequent 03/23/2016  . Dementia 03/23/2016  . Stress at home 01/26/2016  . History of TIA (transient ischemic attack)   . CKD (chronic kidney disease)   . Acute CVA (cerebrovascular accident) (HCC) 08/03/2015  . CVA (cerebral infarction) 08/03/2015  . Dizziness   . Encounter for medication counseling 07/03/2015  . Adjustment disorder with mixed anxiety and depressed mood 09/16/2014  . Hyperlipidemia   . Hypertension     Past Surgical History:  Procedure Laterality Date  . EP IMPLANTABLE DEVICE N/A 08/06/2015   Procedure: Loop Recorder Insertion;  Surgeon:  Hillis RangeJames Allred, MD;  Location: [facility] INVASIVE CV LAB;  Service: Cardiovascular;  Laterality: N/A;  . TEE WITHOUT CARDIOVERSION N/A 08/06/2015   Procedure: TRANSESOPHAGEAL ECHOCARDIOGRAM (TEE);  Surgeon: Lars MassonKatarina H Nelson, MD;  [facility];  Service: Cardiovascular;  Laterality: N/A;    OB History    No data available       Home Medications    Prior to Admission medications   Medication Sig Start Date End Date Taking? Authorizing Provider  amLODipine (NORVASC) 10 MG tablet TAKE 1 TABLET (10 MG TOTAL) BY MOUTH DAILY. 01/30/16   Dianne Dunalia M Aron, MD  aspirin EC 325 MG EC tablet Take 1 tablet (325 mg total) by mouth daily. 08/07/15   Penny Piarlando Vega, MD  Glucosamine-Chondroit-Vit C-Mn (GLUCOSAMINE 1500 COMPLEX) CAPS Take 1 capsule by mouth daily.     Historical Provider, MD  mirtazapine (REMERON) 15 MG tablet TAKE 1 TABLET (15 MG TOTAL) BY MOUTH AT BEDTIME. 01/05/16   Dianne Dunalia M Aron, MD  Omega-3 Fatty Acids (FISH OIL) 1000 MG CAPS Take 1 capsule by mouth daily.      Historical Provider, MD  rosuvastatin (CRESTOR) 5 MG tablet TAKE 1 TABLET (5 MG TOTAL) BY MOUTH DAILY. 01/05/16   Dianne Dunalia M Aron, MD    Family History History reviewed. No pertinent family history.  Social History Social History  Substance Use Topics  . Smoking status: Never Smoker  . Smokeless tobacco: Never  Used  . Alcohol use No     Allergies   Hydrogen peroxide and Sulfa antibiotics   Review of Systems Review of Systems  All other systems reviewed and are negative.    Physical Exam Updated Vital Signs BP 161/86   Pulse 73   Temp 97.8 F (36.6 C) (Oral)   Resp 18   Ht 5\' 5"  (1.651 m)   Wt 142 lb (64.4 kg)   SpO2 94%   BMI 23.63 kg/m   Physical Exam  Constitutional: She is oriented to person, place, and time. She appears well-developed and well-nourished.  HENT:  Head: Normocephalic and atraumatic.  Eyes: Pupils are equal, round, and reactive to light.  Cardiovascular: Normal rate and regular rhythm.   No  murmur heard. Pulmonary/Chest: Effort normal and breath sounds normal. No respiratory distress.  Abdominal: Soft. There is no tenderness. There is no rebound and no guarding.  Musculoskeletal: She exhibits no edema or tenderness.  No C-spine tenderness  Neurological: She is alert and oriented to person, place, and time. No cranial nerve deficit.  5 out of 5 strength in all 4 extremities. Disoriented to time but does know the president.  Skin: Skin is warm and dry.  Psychiatric: She has a normal mood and affect. Her behavior is normal.  Nursing note and vitals reviewed.    ED Treatments / Results  Labs (all labs ordered are listed, but only abnormal results are displayed) Labs Reviewed  BASIC METABOLIC PANEL - Abnormal; Notable for the following:       Result Value   Chloride 112 (*)    Creatinine, Ser 1.14 (*)    GFR calc non Af Amer 46 (*)    GFR calc Af Amer 53 (*)    All other components within normal limits  URINALYSIS, ROUTINE W REFLEX MICROSCOPIC (NOT AT [facility]) - Abnormal; Notable for the following:    Protein, ur 30 (*)    Leukocytes, UA SMALL (*)    All other components within normal limits  URINE MICROSCOPIC-ADD ON - Abnormal; Notable for the following:    Squamous Epithelial / LPF 0-5 (*)    Bacteria, UA FEW (*)    All other components within normal limits  CBC WITH DIFFERENTIAL/PLATELET  CBG MONITORING, ED    EKG  EKG Interpretation  Date/Time:  Friday April 09 2016 10:19:55 EDT Ventricular Rate:  76 PR Interval:    QRS Duration: 85 QT Interval:  426 QTC Calculation: 479 R Axis:   4 Text Interpretation:  Sinus rhythm Nonspecific T abnormalities, lateral leads No significant change since last tracing Confirmed by Lincoln Brigham [phone]) on 04/09/2016 10:27:57 AM Also confirmed by Lincoln Brigham [phone]), editor [place] CT, [place] [phone])  on 04/09/2016 11:13:10 AM       Radiology Dg Chest 2 View  Result Date: 04/09/2016 CLINICAL DATA:  Dizziness. EXAM: CHEST  2 VIEW  COMPARISON:  August 03, 2015 FINDINGS: The heart, hila, mediastinum, lungs, and pleura are unremarkable. Mild scar or atelectasis in the bases. No other acute abnormalities. IMPRESSION: No active cardiopulmonary disease. Electronically Signed   By: Gerome Sam III M.D   On: 04/09/2016 11:08   Ct Head Wo Contrast  Result Date: 04/09/2016 CLINICAL DATA:  Patient fell this morning, hit her head, and has pain. EXAM: CT HEAD WITHOUT CONTRAST CT CERVICAL SPINE WITHOUT CONTRAST TECHNIQUE: Multidetector CT imaging of the head and cervical spine was performed following the standard protocol without intravenous contrast. Multiplanar CT image reconstructions of the cervical  spine were also generated. COMPARISON:  August 04, 2015 FINDINGS: CT HEAD FINDINGS Brain: No subdural, epidural, or subarachnoid hemorrhage. Lacunar infarcts in the cerebellar hemispheres are stable. The cerebellum, brainstem, and basal cisterns are otherwise normal. Moderate to severe white matter changes are stable in the interval with cortical infarct in the right frontal lobe, unchanged. Probable left parietal lobe infarct, unchanged. A lacunar infarct in the right thalamus was present previously. No acute cortical ischemia or infarct identified on today's study. No mass, mass effect, or midline shift. Vascular: No hyperdense vessel or unexpected calcification. Skull: Normal. Negative for fracture or focal lesion. Sinuses/Orbits: No acute finding. Other: None. CT CERVICAL SPINE FINDINGS Alignment: There is reversal of normal lordosis centered at C5-6. No other malalignment. Specifically, no traumatic malalignment identified. Skull base and vertebrae: There is a calcification posterior to the C7 spinous process which is an age-indeterminate. No other fractures are seen. Soft tissues and spinal canal: No prevertebral fluid or swelling. No visible canal hematoma. Disc levels: Multilevel degenerative changes most marked at C5-6 with mild canal  narrowing at this level. Upper chest: Negative. Other: No other abnormalities. IMPRESSION: 1. No acute intracranial process. Multiple small chronic infarcts and white matter changes which are also chronic. 2. No traumatic malalignment in the cervical spine. There is an age-indeterminate fracture associated with the posterior tip of the C7 spinous process. This may not be acute. Recommend clinical correlation. No other fractures identified. Electronically Signed   By: Gerome Sam III M.D   On: 04/09/2016 11:45   Ct Cervical Spine Wo Contrast  Result Date: 04/09/2016 CLINICAL DATA:  Patient fell this morning, hit her head, and has pain. EXAM: CT HEAD WITHOUT CONTRAST CT CERVICAL SPINE WITHOUT CONTRAST TECHNIQUE: Multidetector CT imaging of the head and cervical spine was performed following the standard protocol without intravenous contrast. Multiplanar CT image reconstructions of the cervical spine were also generated. COMPARISON:  August 04, 2015 FINDINGS: CT HEAD FINDINGS Brain: No subdural, epidural, or subarachnoid hemorrhage. Lacunar infarcts in the cerebellar hemispheres are stable. The cerebellum, brainstem, and basal cisterns are otherwise normal. Moderate to severe white matter changes are stable in the interval with cortical infarct in the right frontal lobe, unchanged. Probable left parietal lobe infarct, unchanged. A lacunar infarct in the right thalamus was present previously. No acute cortical ischemia or infarct identified on today's study. No mass, mass effect, or midline shift. Vascular: No hyperdense vessel or unexpected calcification. Skull: Normal. Negative for fracture or focal lesion. Sinuses/Orbits: No acute finding. Other: None. CT CERVICAL SPINE FINDINGS Alignment: There is reversal of normal lordosis centered at C5-6. No other malalignment. Specifically, no traumatic malalignment identified. Skull base and vertebrae: There is a calcification posterior to the C7 spinous process  which is an age-indeterminate. No other fractures are seen. Soft tissues and spinal canal: No prevertebral fluid or swelling. No visible canal hematoma. Disc levels: Multilevel degenerative changes most marked at C5-6 with mild canal narrowing at this level. Upper chest: Negative. Other: No other abnormalities. IMPRESSION: 1. No acute intracranial process. Multiple small chronic infarcts and white matter changes which are also chronic. 2. No traumatic malalignment in the cervical spine. There is an age-indeterminate fracture associated with the posterior tip of the C7 spinous process. This may not be acute. Recommend clinical correlation. No other fractures identified. Electronically Signed   By: Gerome Sam III M.D   On: 04/09/2016 11:45    Procedures Procedures (including critical care time)  Medications Ordered in ED Medications  amLODipine (NORVASC) tablet 10 mg (not administered)  aspirin EC tablet 325 mg (not administered)  rosuvastatin (CRESTOR) tablet 5 mg (not administered)     Initial Impression / Assessment and Plan / ED Course  I have reviewed the triage vital signs and the nursing notes.  Pertinent labs & imaging results that were available during my care of the patient were reviewed by me and considered in my medical decision making (see chart for details).  Clinical Course    Patient with history of dementia presents for evaluation of injuries following fall. On initial evaluation patient is calm and pleasant and minimally confused. When her husband arrived at time of discharge he states that he is concerned for her safety at home but she has increased paranoia and becomes agitated with him at times. The patient states that he is cheating on her with members of the church.  Concern for dementia with some paranoid features. She has been medically cleared for psychiatric evaluation. Denies any SI or HI.    Final Clinical Impressions(s) / ED Diagnoses   Final diagnoses:    Fall, initial encounter    New Prescriptions New Prescriptions   No medications on file     Tilden FossaElizabeth Salsabeel Gorelick, MD 04/09/16 1542

## 2016-04-09 NOTE — ED Notes (Signed)
RN spoke with pt brother on phone, with pt permission.  Brother was very frustrated stating "You are holding her there for legal reasons and that needs to be discussed in the courts and not in the hospital.  You are holding her based just on what her husband says and theres been more going on with that then you all even know.  Its not right."  RN attempted to explain to brother that pt was being medically cleared and we were just ensuring that pt would be safe and have a safe environment to be discharged home to.  Brother remained frustrated and hung up on Charity fundraiserN.

## 2016-04-09 NOTE — ED Notes (Signed)
CBG 89 

## 2016-04-09 NOTE — ED Notes (Signed)
Pt. Ambulated down the hall to bathroom with minimal assist and walker. Pt. Tolerated well with no complaints of pain.

## 2016-04-09 NOTE — Care Management Note (Addendum)
Case Management Note  Patient Details  Name: Sherri [name] MRN: [id number] Date of Birth: 1941/03/31  Subjective/Objective:         S/p mechanical fall. Patient has a hx of dementia.  She has Medicare and Dr. Deborra Medina is her  PCP in [facility],  Patient lives at home with husband, he reports wife not being able to care for self.  Patient states, she has been caring for self at home just fine. Patient shared with this CM that husband wants a divorce.  CM recommended that patient and family follow up with PCP for follow-up with dementia,.  [place] Social Worker was ordered to assist with social issues at home.  Recommendations for [facility] services   Action/Plan: CM spoke with Husband and met with patient at bedside to to discuss recommendations for [facility] services RN, PT, HHA, and SW, patient and husband agreeable with  Transitional care plan. Offered choice [facility] selected. Verified patient's contact information. Referral faxed to [facility] 336 [phone] received fax confirmation. Patient verbalized understanding teach back done. Updated Erin RN and Dr. Jeneen Rinks regarding discharge plan.  Explained to patient that nurse will contact patient by phone to arranged the initial visit. Patient verbalized understanding. Per patient's request to contact son Sherri [name] [phone] to transport her home.   Expected Discharge Date:     04/09/2016             Expected Discharge Plan:  [place]  In-House Referral:     Discharge planning Services  CM Consult  Post Acute Care Choice:  Home Health Choice offered to:  Patient, Spouse  DME Arranged:    DME Agency:     HH Arranged:  RN, PT, Nurse's Aide, Social Work [employer] Agency:  [place]  Status of Service:  Completed, signed off  If discussed at [employer], dates discussed:    Additional CommentsLaurena Slimmer, RN 04/09/2016, 7:14 PM

## 2016-04-09 NOTE — Discharge Instructions (Signed)
Follow-up with her primary care physician.  Discuss your situation and your wife situation with your family. Talk with your primary care physician to form a plan for her future care needs.  She meets no criteria for inpatient hospitalization, psychiatric hospitalization, or skilled nursing placement

## 2016-04-09 NOTE — Care Management (Signed)
CM spoke with son who states he will be here within 30 min to transport patient home. CM updated Erin RN on Pod A. No further ED CM needs identified

## 2016-04-09 NOTE — ED Triage Notes (Signed)
[facility]. States she went out to the garage to be "nosy" and look out the window, where she stood on a 6in cooler and fell off. Pt denies LOC, or injury to her head. States she was feeling dizzy. Per EMS, pt has a hx of dementia and stroke; states family reports no deficits from stroke other than slur. Pt currently denies feeling dizzy or pain. Pt NAD at this time.

## 2016-04-09 NOTE — ED Notes (Signed)
RN attempted to contact son after missing call.  Son was unavailable.

## 2016-04-09 NOTE — Progress Notes (Signed)
CSW contacted patients spouse, Marilu FavreClarence, regarding patients discharge plans. Patients spouse is requesting resources at home before patient returns. Patients spouse would like an aid and nurse to help with patients care. CSW contacted RN CM to discuss resources with patients spouse.   Stacy GardnerErin Ferdinand Revoir, LCSWA Clinical Social Worker [phone]

## 2016-04-09 NOTE — ED Notes (Signed)
Patient transported to X-ray 

## 2016-04-09 NOTE — ED Notes (Signed)
[name] [phone] 

## 2016-04-09 NOTE — ED Provider Notes (Signed)
Patient does not meet inpatient psychiatric criteria. Our case manager has spoken with the patient's husband at length. He will be picking her up. They were encouraged to have family meetings and follow-up with primary care physician to discuss future plans for the patient's ongoing health and social needs.   Rolland PorterMark Elwyn Klosinski, MD 04/09/16 [phone]

## 2016-04-09 NOTE — BH Assessment (Addendum)
Tele Assessment Note   Sherri [name] is a 75 y.o. female [facility]. Upon d/c, pt's husband indicated to EDP that he was concerned for pt's safety due to reported paranoia and increased agitation towards him. Pt denied SI, HI, AVH. Pt did say that her husband told her today that he was ready for a divorce. Pt was not oriented to time, but appeared oriented otherwise. Regarding her husband, pt indicated that he told her that he wanted to see other people and that he "goes out quite frequently at night". Pt's assertions appeared reasonable. She did not appear to have any delusional thought content. She did not appear to have increased agitation while speaking about her husband. Pt remained calm throughout the assessment. There were no other displays of paranoia that clinician could ascertain, after asking several pointed questions to pt about feeling that people are following her or out to get her, etc.   Diagnosis: Deferred  Past Medical History:  Past Medical History:  Diagnosis Date  . Diabetes mellitus without complication (HCC)   . Hyperlipidemia   . Hypertension   . Stroke [facility](HCC)     Past Surgical History:  Procedure Laterality Date  . EP IMPLANTABLE DEVICE N/A 08/06/2015   Procedure: Loop Recorder Insertion;  Surgeon: Hillis RangeJames Allred, MD;  [facility] IN[facility] LAB;  Service: Cardiovascular;  Laterality: N/A;  . TEE WITHOUT CARDIOVERSION N/A 08/06/2015   Procedure: TRANSESOPHAGEAL ECHOCARDIOGRAM (TEE);  Surgeon: Lars MassonKatarina H Nelson, MD;  [facility];  Service: Cardiovascular;  Laterality: N/A;    Family History: History reviewed. No pertinent family history.  Social History:  reports that she has never smoked. She has never used smokeless tobacco. She reports that she does not drink alcohol or use drugs.  Additional Social History:  Alcohol / Drug Use Pain Medications: see PTA Prescriptions: see PTA Over the Counter: see PTA History of  alcohol / drug use?: No history of alcohol / drug abuse  CIWA: CIWA-Ar BP: 162/86 Pulse Rate: 79 COWS:    PATIENT STRENGTHS: (choose at least two) Active sense of humor Average or above average intelligence  Allergies:  Allergies  Allergen Reactions  . Hydrogen Peroxide Swelling  . Sulfa Antibiotics     Home Medications:  (Not in a hospital admission)  OB/GYN Status:  No LMP recorded. Patient is postmenopausal.  General Assessment Data [facility] Assessment: In system Is this a Tele or Face-to-Face Assessment?: Tele Assessment Is this an Initial Assessment or a Re-assessment for this encounter?: Initial Assessment Marital status: Married Is patient pregnant?: No Pregnancy Status: No Living Arrangements: Spouse/significant other Can pt return to current living arrangement?: Yes Admission Status: Voluntary Is patient capable of signing voluntary admission?: Yes Referral Source: Self/Family/Friend Insurance type: Medicare     Crisis Care Plan Living Arrangements: Spouse/significant other Name of Psychiatrist: none Name of Therapist: none  Education Status Is patient currently in school?: No  Risk to self with the past 6 months Suicidal Ideation: No Has patient been a risk to self within the past 6 months prior to admission? : No Suicidal Intent: No Has patient had any suicidal intent within the past 6 months prior to admission? : No Is patient at risk for suicide?: No Suicidal Plan?: No Has patient had any suicidal plan within the past 6 months prior to admission? : No Access to Means: No What has been your use of drugs/alcohol within the last 12 months?:  pt denies Previous Attempts/Gestures: No Intentional Self Injurious Behavior: None Family Suicide History: Unknown Recent stressful life event(s): Conflict (Comment) (with husband) Persecutory voices/beliefs?: No Substance abuse history and/or treatment for substance abuse?:  No Suicide prevention information given to non-admitted patients: Not applicable  Risk to Others within the past 6 months Homicidal Ideation: No Does patient have any lifetime risk of violence toward others beyond the six months prior to admission? : No Thoughts of Harm to Others: No Current Homicidal Intent: No Current Homicidal Plan: No Access to Homicidal Means: No History of harm to others?: No Assessment of Violence: None Noted Does patient have access to weapons?: No Criminal Charges Pending?: No Does patient have a court date: No Is patient on probation?: No  Psychosis Hallucinations: None noted Delusions: None noted  Mental Status Report Appearance/Hygiene: Unremarkable Eye Contact: Good Motor Activity: Unremarkable Speech: Logical/coherent Level of Consciousness: Alert Mood: Pleasant Affect: Appropriate to circumstance Anxiety Level: None Thought Processes: Coherent, Relevant Judgement: Unimpaired Orientation: Person, Place Obsessive Compulsive Thoughts/Behaviors: None  Cognitive Functioning Concentration: Normal Memory: Unable to Assess IQ: Average Insight: see judgement above Impulse Control: Good Appetite: Good Sleep: No Change Vegetative Symptoms: None  ADLScreening [facility] Assessment Services) Patient's cognitive ability adequate to safely complete daily activities?: Yes Patient able to express need for assistance with ADLs?: Yes  Prior Inpatient Therapy Prior Inpatient Therapy: No  Prior Outpatient Therapy Prior Outpatient Therapy: No Does patient have an ACCT team?: No Does patient have Intensive In-House Services?  : No Does patient have [facility] services? : No Does patient have P4CC services?: No  ADL Screening (condition at time of admission) Patient's cognitive ability adequate to safely complete daily activities?: Yes Is the patient deaf or have difficulty hearing?: No Does the patient have difficulty seeing, even when wearing  glasses/contacts?: Yes Does the patient have difficulty concentrating, remembering, or making decisions?: Yes Patient able to express need for assistance with ADLs?: Yes Does the patient have difficulty dressing or bathing?: No Does the patient have difficulty walking or climbing stairs?: Yes       Abuse/Neglect Assessment (Assessment to be complete while patient is alone) Physical Abuse: Denies Verbal Abuse: Denies Sexual Abuse: Denies Exploitation of patient/patient's resources: Denies Self-Neglect: Denies Values / Beliefs Cultural Requests During Hospitalization: None Spiritual Requests During Hospitalization: None Consults Spiritual Care Consult Needed: No Social Work Consult Needed: No Merchant navy officerAdvance Directives (For Healthcare) Does patient have an advance directive?: No Would patient like information on creating an advanced directive?: No - patient declined information    Additional Information 1:1 In Past 12 Months?: No CIRT Risk: No Elopement Risk: No Does patient have medical clearance?: Yes     Disposition:  Disposition Initial Assessment Completed for this Encounter: Yes (consulted with Fransisca KaufmannLaura Davis, NP) Disposition of Patient: Other dispositions Other disposition(s): To current provider (Follow up for concerns of advanced dementia)  Laddie AquasSamantha M Delaynee Alred 04/09/2016 4:48 PM

## 2016-04-09 NOTE — ED Notes (Addendum)
TTS in progress 

## 2016-04-12 NOTE — Progress Notes (Signed)
EDCM asked by EDSW to call patient's husband to discuss home health services.  Patient noted to have been seen in the ED on Friday and [facility]  EDCM called [facility] and spoke to [facility] who rpeorts patient was to have a visiting RN out to the patient's house today.  Danielle reports to call back if RN did not show.  Duwayne HeckDanielle confirms that referral was received. EDCM called and spoke ot patient's husband clearance [phone] who reports the visiting RN did not show up today.  EDCM provided patient's [facility] with [facility].  Clearance reports he cannot take care of the patient.  EDCM provided patient's husband with phone number to choice connections for further assistance if placement needed.  EDCM informed patient's husband that [facility] to let them know that patient needs to be seen by SW as soon as possible.  Patient's husband agreeable with plan and thankful for assistance. EDCM called [facility] back and spoke to Hol[place]bara who reports she will relay above information to [place].  EDCM provided Britta MccreedyBarbara with phone number to patient's husband [phone] and reports they  Have a totally different phone number in their system for this patient.  Britta MccreedyBarbara reports she will call the on call RN Margaretha GlassingLoretta and provide her with husband's phone number.  No further EDCM needs at this time.

## 2016-04-19 ENCOUNTER — Telehealth: Payer: Self-pay | Admitting: Family Medicine

## 2016-04-19 NOTE — Telephone Encounter (Signed)
PT was assessed and will not be admitted by [facility] at this time. She lives alone and due to her dementia she needs someone there and no one was able to commit to visits.

## 2016-04-20 NOTE — Telephone Encounter (Signed)
Attempted to contact pt; unable to leave vm 

## 2016-04-20 NOTE — Telephone Encounter (Signed)
She has been living with her husband. Is she now living alone?

## 2016-04-21 NOTE — Telephone Encounter (Signed)
Attempted to contact pt; line busy. LM on Amy, with [facility]'s vm and advised per Dr [name]. Requested a call back to discuss further, if needed.

## 2016-04-21 NOTE — Telephone Encounter (Signed)
Spoke to Amy who states that she went to the pts home last week and no one was available. She later spoke with the husband and made arrangements to meet, but when she arrived, the pt was there alone and was unable to answer any of the appropriate questions. Amy states that she then spoke to pts husband, again, and advised  Him that the pt is not needing to be at home alone, but he would not commit to being at home for upcoming visits. He states that their daughter would be in that evening and would get back in touch with Amy, but did not. I provided Amy with the name and telephone number of pts brother, as he is listed on her DPR and has accompanied pt at her last few appts. She will contact him directly and discuss matters of the pt. This is only a FYI to Dr [name].

## 2016-04-22 ENCOUNTER — Telehealth: Payer: Self-pay | Admitting: *Deleted

## 2016-04-22 NOTE — Telephone Encounter (Signed)
Pt's brother left a note at triage(DPR on file). Pt's brother would like to know if pt needs any more vaccines, his states in the note that he was at pt's home on 03/23/16 and she received "a shot" so now he wants to know if she needs any other "shots"

## 2016-04-22 NOTE — Telephone Encounter (Signed)
The only thing she may needs is a shingles vaccine. I did not see this listed on her documented immunizations. Does she know if she has ever had one?

## 2016-04-22 NOTE — Telephone Encounter (Signed)
Spoke to pts brother and advised. F/u appt scheduled

## 2016-04-23 ENCOUNTER — Telehealth: Payer: Self-pay | Admitting: Cardiology

## 2016-04-23 NOTE — Telephone Encounter (Signed)
Spoke w/ pt and requested that she send a manual transmission b/c her home monitor has not updated in at least 14 days.   

## 2016-04-28 ENCOUNTER — Ambulatory Visit (INDEPENDENT_AMBULATORY_CARE_PROVIDER_SITE_OTHER): Payer: Medicare Other | Admitting: Family Medicine

## 2016-04-28 ENCOUNTER — Encounter: Payer: Self-pay | Admitting: Family Medicine

## 2016-04-28 VITALS — BP 172/88 | HR 78 | Temp 98.1°F | Wt 144.5 lb

## 2016-04-28 DIAGNOSIS — I639 Cerebral infarction, unspecified: Secondary | ICD-10-CM

## 2016-04-28 DIAGNOSIS — F0390 Unspecified dementia without behavioral disturbance: Secondary | ICD-10-CM

## 2016-04-28 DIAGNOSIS — F439 Reaction to severe stress, unspecified: Secondary | ICD-10-CM

## 2016-04-28 NOTE — Assessment & Plan Note (Signed)
>  25 minutes spent in face to face time with patient, >50% spent in counselling or coordination of care Difficult home situation.  Both Sherri [name]'s brother and [name] feel she is safe at home.  He is going to call private companies to see if she can set up daily in home care to check on her and to help with meal and light cleaning. He will keep me updated.

## 2016-04-28 NOTE — Progress Notes (Signed)
Pre visit review using our clinic review tool, if applicable. No additional management support is needed unless otherwise documented below in the visit note. 

## 2016-04-28 NOTE — Progress Notes (Signed)
Subjective:   Patient ID: Sherri [name], female    DOB: 03/20/1941, 75 y.o.   MRN: [id number]  Sherri BirkCreola S Edenfield is a pleasant 75 y.o. year old female who presents to clinic today with her brother for Follow-up  on 04/28/2016  HPI:   See phone note from 04/19/16-  Spoke to Amy who states that she went to the pts home last week and no one was available. She later spoke with the husband and made arrangements to meet, but when she arrived, the pt was there alone and was unable to answer any of the appropriate questions. Amy states that she then spoke to pts husband, again, and advised  Him that the pt is not needing to be at home alone, but he would not commit to being at home for upcoming visits. He states that their daughter would be in that evening and would get back in touch with Amy, but did not. I provided Amy with the name and telephone number of pts brother, as he is listed on her DPR and has accompanied pt at her last few appts. She will contact him directly and discuss matters of the pt. This is only a FYI to Dr [name].  Dementia has been more significant. She is no longer taking rx for this.  In October, I referred her to neurology for further evaluation. She also has had multiple referrals for home health.  Current Outpatient Prescriptions on File Prior to Visit  Medication Sig Dispense Refill  . amLODipine (NORVASC) 10 MG tablet TAKE 1 TABLET (10 MG TOTAL) BY MOUTH DAILY. 90 tablet 1  . aspirin EC 325 MG EC tablet Take 1 tablet (325 mg total) by mouth daily. 30 tablet 0  . Glucosamine-Chondroit-Vit C-Mn (GLUCOSAMINE 1500 COMPLEX) CAPS Take 1 capsule by mouth daily.     . mirtazapine (REMERON) 15 MG tablet TAKE 1 TABLET (15 MG TOTAL) BY MOUTH AT BEDTIME. 90 tablet 0  . Omega-3 Fatty Acids (FISH OIL) 1000 MG CAPS Take 1 capsule by mouth daily.      . rosuvastatin (CRESTOR) 5 MG tablet TAKE 1 TABLET (5 MG TOTAL) BY MOUTH DAILY. 90 tablet 0   No current facility-administered  medications on file prior to visit.     Allergies  Allergen Reactions  . Hydrogen Peroxide Swelling  . Sulfa Antibiotics     Past Medical History:  Diagnosis Date  . Diabetes mellitus without complication (HCC)   . Hyperlipidemia   . Hypertension   . Stroke [facility](HCC)     Past Surgical History:  Procedure Laterality Date  . EP IMPLANTABLE DEVICE N/A 08/06/2015   Procedure: Loop Recorder Insertion;  Surgeon: Hillis RangeJames Allred, MD;  [facility] IN[facility] LAB;  Service: Cardiovascular;  Laterality: N/A;  . TEE WITHOUT CARDIOVERSION N/A 08/06/2015   Procedure: TRANSESOPHAGEAL ECHOCARDIOGRAM (TEE);  Surgeon: Lars MassonKatarina H Nelson, MD;  [facility];  Service: Cardiovascular;  Laterality: N/A;    No family history on file.  Social History   Social History  . Marital status: Married    Spouse name: N/A  . Number of children: N/A  . Years of education: N/A   Occupational History  . Not on file.   Social History Main Topics  . Smoking status: Never Smoker  . Smokeless tobacco: Never Used  . Alcohol use No  . Drug use: No  . Sexual activity: No   Other Topics Concern  . Not on file   Social History Narrative   Desires  CPR.   Would not want prolonged life support if futile   The PMH, PSH, Social History, Family History, Medications, and allergies have [facility], and have been updated if relevant.  Review of Systems  Psychiatric/Behavioral: Positive for decreased concentration.  All other systems reviewed and are negative.      Objective:    BP (!) 172/88   Pulse 78   Temp 98.1 F (36.7 C) (Oral)   Wt 144 lb 8 oz (65.5 kg)   SpO2 97%   BMI 24.05 kg/m    Physical Exam   General:  Well-developed,well-nourished,in no acute distress; alert,appropriate and cooperative throughout examination Head:  normocephalic and atraumatic.   Eyes:  vision grossly intact, PERRL Ears:  R ear normal and L ear normal externally, TMs clear bilaterally Nose:  no  external deformity.   Mouth:  good dentition.   Lungs:  Normal respiratory effort, chest expands symmetrically. Lungs are clear to auscultation, no crackles or wheezes. Heart:  Normal rate and regular rhythm. S1 and S2 normal without gallop, murmur, click, rub or other extra sounds. Msk:  No deformity or scoliosis noted of thoracic or lumbar spine.   Extremities:  No clubbing, cyanosis, edema, or deformity noted with normal full range of motion of all joints.   Neurologic:  alert & oriented X3 and gait normal.   Skin:  Intact without suspicious lesions or rashes Psych:  Cognition and judgment appear intact. Alert and cooperative with normal attention span and concentration. No apparent delusions, illusions, hallucinations.c      Assessment & Plan:   Dementia without behavioral disturbance, unspecified dementia type  Stress at home No Follow-up on file.

## 2016-05-03 ENCOUNTER — Ambulatory Visit (INDEPENDENT_AMBULATORY_CARE_PROVIDER_SITE_OTHER): Payer: Medicare Other | Admitting: *Deleted

## 2016-05-03 DIAGNOSIS — I639 Cerebral infarction, unspecified: Secondary | ICD-10-CM | POA: Diagnosis not present

## 2016-05-03 LAB — CUP PACEART REMOTE DEVICE CHECK
MDC IDC PG IMPLANT DT: 20170301
MDC IDC SESS DTM: 20171027213953

## 2016-05-03 NOTE — Progress Notes (Signed)
Carelink summary report received. Battery status OK. Normal device function. No new symptom episodes, tachy episodes, brady, or pause episodes. No new AF episodes. Monthly summary reports and ROV/PRN 

## 2016-05-04 NOTE — Progress Notes (Signed)
Carelink Summary Report / Loop Recorder 

## 2016-05-12 ENCOUNTER — Telehealth: Payer: Self-pay
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
## 2016-05-21 ENCOUNTER — Encounter: Payer: Self-pay | Admitting: Cardiology

## 2016-06-01 ENCOUNTER — Encounter: Payer: Medicare Other | Admitting: *Deleted

## 2016-06-02 ENCOUNTER — Telehealth: Payer: Self-pay

## 2016-06-02 NOTE — Telephone Encounter (Signed)
Sherri [name] with [facility] left v/m requesting cb. I called back and left message with [facility] answering service and she will have Sherri [name] return call on 06/03/16.

## 2016-06-03 ENCOUNTER — Other Ambulatory Visit: Payer: Self-pay | Admitting: *Deleted

## 2016-06-03 NOTE — Telephone Encounter (Signed)
Becky with [facility] called and Sheral Flowdwina is in a meeting and will cb with details after meeting.

## 2016-06-03 NOTE — Telephone Encounter (Signed)
Can you call to check on patient, husband
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
## 2016-06-03 NOTE — Patient Outreach (Addendum)
[facility]) Care Management [place] Telephone Outreach 06/03/2016  Sherri [name] August 19, 1940 [id number]  Unsuccessful telephone outreach to Sherri [name], husband of Sherri [name], 75 y/o female, previously active with and well-known to [place] in March 2017 on referral from [place] after having IP hospital visit February 26-August 07, 2015 for CVA  I received voicemail message yesterday afternoon from Sherri [name], asking that I return his call, regarding his ongoing belief that patient needs to be placed in a nursing facility.  In the voicemail message I received yesterday, Sherri [name] (again) stated that patient's doctor's are not providing him with any information, and he wanted to speak with me about ongoing family dynamics, and obtain assistance with having patient placed in a nursing facility.  To note, patient's husband, Sherri [name], is on [facility] CM written consent for patient, but is not listed as a contact for patient with her PCP.  Today, I left Sherri [name] a return voicemail message, and again advised him to work with patient's family members that are on active patient contact list with patient's PCP to resolve ongoing family dynamic issues, in an effort to ensure that patient care needs are met.  Plan:  Will route this note as an FYI to patient's PCP.  Oneta Rack, RN, BSN, [employer] [facility] Care Management  [phone]

## 2016-06-03 NOTE — Telephone Encounter (Signed)
Attempted to contact pts home twice. Phone picked up and D/c

## 2016-06-04 ENCOUNTER — Encounter: Payer: Self-pay | Admitting: Cardiology

## 2016-06-04 NOTE — Telephone Encounter (Signed)
LM on pts vm requesting a call back 

## 2016-06-08 ENCOUNTER — Other Ambulatory Visit: Payer: Self-pay | Admitting: Internal Medicine

## 2016-06-11 LAB — CUP PACEART REMOTE DEVICE CHECK
MDC IDC PG IMPLANT DT: 20170301
MDC IDC SESS DTM: 20171126221030

## 2016-06-16 ENCOUNTER — Encounter: Payer: Self-pay | Admitting: Family Medicine

## 2016-06-16 ENCOUNTER — Ambulatory Visit (INDEPENDENT_AMBULATORY_CARE_PROVIDER_SITE_OTHER): Payer: Medicare Other | Admitting: Family Medicine

## 2016-06-16 VITALS — BP 178/98 | HR 99 | Temp 98.5°F | Wt 137.5 lb

## 2016-06-16 DIAGNOSIS — F439 Reaction to severe stress, unspecified: Secondary | ICD-10-CM

## 2016-06-16 NOTE — Progress Notes (Signed)
Pre visit review using our clinic review tool, if applicable. No additional management support is needed unless otherwise documented below in the visit note. 
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
## 2016-06-16 NOTE — Progress Notes (Signed)
Subjective:   Patient ID: Sherri [name], female    DOB: Jul 05, 1940, 76 y.o.   MRN: [id number]  Sherri [name] is a pleasant 76 y.o. year old female who presents to clinic today with Follow-up and Memory Loss  on 06/16/2016  HPI:  Here with her brother to initially discuss memory loss but we discussed difficult home situation in stead.  Reviewed chart with her brother as he was unaware of some of the issues that have been having.  He knew her husband has been wanting to get her out of the house but did not realize this were this bad.  APS is coming to their home today.   Brother does not think she is eating or getting her medicines.  He does want someone coming to her home daily- is Engineer, materials to do this. They will help with bathing, feeding and medication management.  He is also considering moving his sister out of the home where she currently resides with her husband.  Current Outpatient Prescriptions on File Prior to Visit  Medication Sig Dispense Refill  . amLODipine (NORVASC) 10 MG tablet TAKE 1 TABLET (10 MG TOTAL) BY MOUTH DAILY. 90 tablet 1  . aspirin EC 325 MG EC tablet Take 1 tablet (325 mg total) by mouth daily. 30 tablet 0  . Glucosamine-Chondroit-Vit C-Mn (GLUCOSAMINE 1500 COMPLEX) CAPS Take 1 capsule by mouth daily.     . mirtazapine (REMERON) 15 MG tablet TAKE 1 TABLET (15 MG TOTAL) BY MOUTH AT BEDTIME. 90 tablet 0  . Omega-3 Fatty Acids (FISH OIL) 1000 MG CAPS Take 1 capsule by mouth daily.      . rosuvastatin (CRESTOR) 5 MG tablet TAKE 1 TABLET (5 MG TOTAL) BY MOUTH DAILY. 90 tablet 0   No current facility-administered medications on file prior to visit.     Allergies  Allergen Reactions  . Hydrogen Peroxide Swelling  . Sulfa Antibiotics     Past Medical History:  Diagnosis Date  . Diabetes mellitus without complication (HCC)   . Hyperlipidemia   . Hypertension   . Stroke [facility])     Past Surgical History:  Procedure Laterality Date  . EP  IMPLANTABLE DEVICE N/A 08/06/2015   Procedure: Loop Recorder Insertion;  Surgeon: Hillis Range, MD;  [facility] INVAS[facility] CV LAB;  Service: Cardiovascular;  Laterality: N/A;  . TEE WITHOUT CARDIOVERSION N/A 08/06/2015   Procedure: TRANSESOPHAGEAL ECHOCARDIOGRAM (TEE);  Surgeon: Lars Masson, MD;  Location: [facility] ENDOSCOPY;  Service: Cardiovascular;  Laterality: N/A;    No family history on file.  Social History   Social History  . Marital status: Married    Spouse name: N/A  . Number of children: N/A  . Years of education: N/A   Occupational History  . Not on file.   Social History Main Topics  . Smoking status: Never Smoker  . Smokeless tobacco: Never Used  . Alcohol use No  . Drug use: No  . Sexual activity: No   Other Topics Concern  . Not on file   Social History Narrative   Desires CPR.   Would not want prolonged life support if futile   The PMH, PSH, Social History, Family History, Medications, and allergies have been reviewed in [facility], and have been updated if relevant.   Review of Systems  All other systems reviewed and are negative.      Objective:    BP (!) 178/98   Pulse 99   Temp 98.5 F (36.9 C) (Oral)  Wt 137 lb 8 oz (62.4 kg)   SpO2 96%   BMI 22.88 kg/m   Wt Readings from Last 3 Encounters:  06/16/16 137 lb 8 oz (62.4 kg)  04/28/16 144 lb 8 oz (65.5 kg)  04/09/16 142 lb (64.4 kg)    Physical Exam  Constitutional: She is oriented to person, place, and time. She appears well-developed. No distress.  Poor eye contact, does appear upset by the conversations  HENT:  Head: Normocephalic.  Eyes: Conjunctivae are normal.  Cardiovascular: Normal rate.   Pulmonary/Chest: Effort normal.  Neurological: She is alert and oriented to person, place, and time. No cranial nerve deficit.  Skin: Skin is warm and dry. She is not diaphoretic.  Psychiatric: She has a normal mood and affect. Her behavior is normal. Judgment and thought content normal.    Nursing note and vitals reviewed.         Assessment & Plan:   Stress at home No Follow-up on file.

## 2016-07-01 ENCOUNTER — Ambulatory Visit (INDEPENDENT_AMBULATORY_CARE_PROVIDER_SITE_OTHER): Payer: Medicare Other | Admitting: *Deleted

## 2016-07-01 ENCOUNTER — Telehealth: Payer: Self-pay | Admitting: Cardiology

## 2016-07-01 DIAGNOSIS — I639 Cerebral infarction, unspecified: Secondary | ICD-10-CM | POA: Diagnosis not present

## 2016-07-01 NOTE — Telephone Encounter (Signed)
Spoke w/ pt and requested that she send a manual transmission b/c her home monitor has not updated in at least 14 days.   

## 2016-07-02 NOTE — Progress Notes (Signed)
Carelink Summary Report / Loop Recorder 

## 2016-07-07 ENCOUNTER — Encounter: Payer: Self-pay | Admitting: Cardiology

## 2016-07-13 ENCOUNTER — Other Ambulatory Visit: Payer: Self-pay | Admitting: Internal Medicine

## 2016-07-21 ENCOUNTER — Encounter: Payer: Self-pay | Admitting: Cardiology

## 2016-08-01 LAB — CUP PACEART REMOTE DEVICE CHECK
Implantable Pulse Generator Implant Date: 20170301
MDC IDC SESS DTM: 20180125233728

## 2016-08-02 ENCOUNTER — Encounter: Payer: Medicare Other | Admitting: *Deleted

## 2016-08-16 ENCOUNTER — Telehealth: Payer: Self-pay | Admitting: Family Medicine

## 2016-08-16 DIAGNOSIS — R262 Difficulty in walking, not elsewhere classified: Secondary | ICD-10-CM | POA: Diagnosis not present

## 2016-08-16 DIAGNOSIS — B351 Tinea unguium: Secondary | ICD-10-CM | POA: Diagnosis not present

## 2016-08-16 DIAGNOSIS — I739 Peripheral vascular disease, unspecified: Secondary | ICD-10-CM | POA: Diagnosis not present

## 2016-08-16 NOTE — Telephone Encounter (Signed)
Sherri [name] called, he would like to have current insurance card  dr Abundio Miutiwari fax number is [phone]

## 2016-08-17 NOTE — Telephone Encounter (Signed)
Faxed to the number requested.

## 2016-09-21 DIAGNOSIS — M47817 Spondylosis without myelopathy or radiculopathy, lumbosacral region: Secondary | ICD-10-CM | POA: Diagnosis not present

## 2016-09-21 DIAGNOSIS — M5126 Other intervertebral disc displacement, lumbar region: Secondary | ICD-10-CM | POA: Diagnosis not present

## 2016-09-21 DIAGNOSIS — M5416 Radiculopathy, lumbar region: Secondary | ICD-10-CM | POA: Diagnosis not present

## 2016-09-21 DIAGNOSIS — M545 Low back pain: Secondary | ICD-10-CM | POA: Diagnosis not present

## 2016-10-05 DIAGNOSIS — M5126 Other intervertebral disc displacement, lumbar region: Secondary | ICD-10-CM | POA: Diagnosis not present

## 2016-10-05 DIAGNOSIS — M47817 Spondylosis without myelopathy or radiculopathy, lumbosacral region: Secondary | ICD-10-CM | POA: Diagnosis not present

## 2016-10-05 DIAGNOSIS — M5416 Radiculopathy, lumbar region: Secondary | ICD-10-CM | POA: Diagnosis not present

## 2016-10-05 DIAGNOSIS — M545 Low back pain: Secondary | ICD-10-CM | POA: Diagnosis not present

## 2016-10-07 ENCOUNTER — Other Ambulatory Visit: Payer: Self-pay

## 2016-10-07 MED ORDER — MIRTAZAPINE 15 MG PO TABS: 15.0000 mg | ORAL_TABLET | Freq: Every day | ORAL | 0 refills | Status: DC

## 2016-10-07 NOTE — Telephone Encounter (Signed)
Henry RusselJames Smith (DPR signed) left v/m requesting refill mirtazapine to [facility]; pt is there with Mr Katrinka BlazingSmith for few more weeks. Last refilled # 90 on 01/05/16. Pt last seen 06/16/16.

## 2016-11-17 ENCOUNTER — Other Ambulatory Visit: Payer: Self-pay | Admitting: Family Medicine

## 2016-11-23 ENCOUNTER — Observation Stay (HOSPITAL_COMMUNITY)
Admission: EM | Admit: 2016-11-23 | Discharge: 2016-11-25 | Disposition: A | Discharge status: Home / Self Care | Payer: Medicare Other | Attending: Internal Medicine | Admitting: Internal Medicine

## 2016-11-23 ENCOUNTER — Encounter (HOSPITAL_COMMUNITY): Payer: Self-pay

## 2016-11-23 ENCOUNTER — Emergency Department (HOSPITAL_COMMUNITY): Payer: Medicare Other

## 2016-11-23 ENCOUNTER — Other Ambulatory Visit: Payer: Self-pay

## 2016-11-23 DIAGNOSIS — R42 Dizziness and giddiness: Principal | ICD-10-CM

## 2016-11-23 DIAGNOSIS — Z8249 Family history of ischemic heart disease and other diseases of the circulatory system: Secondary | ICD-10-CM | POA: Diagnosis not present

## 2016-11-23 DIAGNOSIS — Z7982 Long term (current) use of aspirin: Secondary | ICD-10-CM | POA: Insufficient documentation

## 2016-11-23 DIAGNOSIS — I69354 Hemiplegia and hemiparesis following cerebral infarction affecting left non-dominant side: Secondary | ICD-10-CM | POA: Diagnosis not present

## 2016-11-23 DIAGNOSIS — Z79899 Other long term (current) drug therapy: Secondary | ICD-10-CM | POA: Insufficient documentation

## 2016-11-23 DIAGNOSIS — F4323 Adjustment disorder with mixed anxiety and depressed mood: Secondary | ICD-10-CM | POA: Diagnosis not present

## 2016-11-23 DIAGNOSIS — I10 Essential (primary) hypertension: Secondary | ICD-10-CM | POA: Diagnosis not present

## 2016-11-23 DIAGNOSIS — E785 Hyperlipidemia, unspecified: Secondary | ICD-10-CM | POA: Diagnosis present

## 2016-11-23 DIAGNOSIS — I69392 Facial weakness following cerebral infarction: Secondary | ICD-10-CM | POA: Diagnosis not present

## 2016-11-23 DIAGNOSIS — G458 Other transient cerebral ischemic attacks and related syndromes: Secondary | ICD-10-CM

## 2016-11-23 DIAGNOSIS — F439 Reaction to severe stress, unspecified: Secondary | ICD-10-CM | POA: Insufficient documentation

## 2016-11-23 DIAGNOSIS — Z794 Long term (current) use of insulin: Secondary | ICD-10-CM | POA: Diagnosis not present

## 2016-11-23 DIAGNOSIS — N183 Chronic kidney disease, stage 3 (moderate): Secondary | ICD-10-CM | POA: Diagnosis not present

## 2016-11-23 DIAGNOSIS — E1122 Type 2 diabetes mellitus with diabetic chronic kidney disease: Secondary | ICD-10-CM | POA: Diagnosis not present

## 2016-11-23 DIAGNOSIS — N189 Chronic kidney disease, unspecified: Secondary | ICD-10-CM | POA: Diagnosis present

## 2016-11-23 DIAGNOSIS — I6529 Occlusion and stenosis of unspecified carotid artery: Secondary | ICD-10-CM | POA: Insufficient documentation

## 2016-11-23 DIAGNOSIS — F0390 Unspecified dementia without behavioral disturbance: Secondary | ICD-10-CM | POA: Diagnosis not present

## 2016-11-23 DIAGNOSIS — Z882 Allergy status to sulfonamides status: Secondary | ICD-10-CM | POA: Insufficient documentation

## 2016-11-23 DIAGNOSIS — G9389 Other specified disorders of brain: Secondary | ICD-10-CM | POA: Insufficient documentation

## 2016-11-23 DIAGNOSIS — E876 Hypokalemia: Secondary | ICD-10-CM | POA: Diagnosis present

## 2016-11-23 DIAGNOSIS — I6930 Unspecified sequelae of cerebral infarction: Secondary | ICD-10-CM | POA: Diagnosis not present

## 2016-11-23 DIAGNOSIS — Z888 Allergy status to other drugs, medicaments and biological substances status: Secondary | ICD-10-CM | POA: Insufficient documentation

## 2016-11-23 DIAGNOSIS — I129 Hypertensive chronic kidney disease with stage 1 through stage 4 chronic kidney disease, or unspecified chronic kidney disease: Secondary | ICD-10-CM | POA: Insufficient documentation

## 2016-11-23 DIAGNOSIS — E119 Type 2 diabetes mellitus without complications: Secondary | ICD-10-CM | POA: Diagnosis not present

## 2016-11-23 LAB — CBC
HEMATOCRIT: 40.6 % (ref 36.0–46.0)
Hemoglobin: 12.4 g/dL (ref 12.0–15.0)
MCH: 27.8 pg (ref 26.0–34.0)
MCHC: 30.5 g/dL (ref 30.0–36.0)
MCV: 91 fL (ref 78.0–100.0)
PLATELETS: 207 10*3/uL (ref 150–400)
RBC: 4.46 MIL/uL (ref 3.87–5.11)
RDW: 14.2 % (ref 11.5–15.5)
WBC: 6.9 10*3/uL (ref 4.0–10.5)

## 2016-11-23 LAB — COMPREHENSIVE METABOLIC PANEL
ALK PHOS: 81 U/L (ref 38–126)
ALT: 16 U/L (ref 14–54)
AST: 19 U/L (ref 15–41)
Albumin: 3.9 g/dL (ref 3.5–5.0)
Anion gap: 9 (ref 5–15)
BUN: 15 mg/dL (ref 6–20)
CALCIUM: 9.4 mg/dL (ref 8.9–10.3)
CHLORIDE: 107 mmol/L (ref 101–111)
CO2: 26 mmol/L (ref 22–32)
CREATININE: 1.18 mg/dL — AB (ref 0.44–1.00)
GFR calc Af Amer: 50 mL/min — ABNORMAL LOW (ref >60)
GFR calc non Af Amer: 43 mL/min — ABNORMAL LOW (ref >60)
GLUCOSE: 99 mg/dL (ref 65–99)
Potassium: 3.2 mmol/L — ABNORMAL LOW (ref 3.5–5.1)
SODIUM: 142 mmol/L (ref 135–145)
Total Bilirubin: 0.3 mg/dL (ref 0.3–1.2)
Total Protein: 7.2 g/dL (ref 6.5–8.1)

## 2016-11-23 LAB — DIFFERENTIAL
BASOS ABS: 0 10*3/uL (ref 0.0–0.1)
Basophils Relative: 0 %
Eosinophils Absolute: 0.2 10*3/uL (ref 0.0–0.7)
Eosinophils Relative: 3 %
LYMPHS PCT: 30 %
Lymphs Abs: 2.1 10*3/uL (ref 0.7–4.0)
Monocytes Absolute: 0.4 10*3/uL (ref 0.1–1.0)
Monocytes Relative: 5 %
NEUTROS ABS: 4.2 10*3/uL (ref 1.7–7.7)
Neutrophils Relative %: 62 %

## 2016-11-23 LAB — I-STAT CHEM 8, ED
BUN: 18 mg/dL (ref 6–20)
CHLORIDE: 107 mmol/L (ref 101–111)
CREATININE: 1.1 mg/dL — AB (ref 0.44–1.00)
Calcium, Ion: 1.12 mmol/L — ABNORMAL LOW (ref 1.15–1.40)
GLUCOSE: 102 mg/dL — AB (ref 65–99)
HCT: 41 % (ref 36.0–46.0)
Hemoglobin: 13.9 g/dL (ref 12.0–15.0)
POTASSIUM: 3.3 mmol/L — AB (ref 3.5–5.1)
Sodium: 143 mmol/L (ref 135–145)
TCO2: 23 mmol/L (ref 0–100)

## 2016-11-23 LAB — PROTIME-INR
INR: 1
PROTHROMBIN TIME: 13.2 s (ref 11.4–15.2)

## 2016-11-23 LAB — I-STAT TROPONIN, ED: Troponin i, poc: 0.01 ng/mL (ref 0.00–0.08)

## 2016-11-23 LAB — APTT: APTT: 30 s (ref 24–36)

## 2016-11-23 MED ORDER — ACETAMINOPHEN 160 MG/5ML PO SOLN: 650.0000 mg | ORAL | Status: DC | PRN

## 2016-11-23 MED ORDER — ACETAMINOPHEN 650 MG RE SUPP: 650.0000 mg | RECTAL | Status: DC | PRN

## 2016-11-23 MED ORDER — AMLODIPINE BESYLATE 5 MG PO TABS
10.0000 mg | ORAL_TABLET | Freq: Every day | ORAL | Status: DC
  Administered 2016-11-24 – 2016-11-25 (×3): 10 mg via ORAL
  Filled 2016-11-23 (×3): qty 2

## 2016-11-23 MED ORDER — POTASSIUM CHLORIDE CRYS ER 20 MEQ PO TBCR
40.0000 meq | EXTENDED_RELEASE_TABLET | ORAL | Status: AC
  Administered 2016-11-23: 40 meq via ORAL
  Filled 2016-11-23: qty 2

## 2016-11-23 MED ORDER — ROSUVASTATIN CALCIUM 5 MG PO TABS
5.0000 mg | ORAL_TABLET | Freq: Every day | ORAL | Status: DC
  Administered 2016-11-24 – 2016-11-25 (×2): 5 mg via ORAL
  Filled 2016-11-23 (×2): qty 1

## 2016-11-23 MED ORDER — SENNOSIDES-DOCUSATE SODIUM 8.6-50 MG PO TABS: 1.0000 | ORAL_TABLET | Freq: Every evening | ORAL | Status: DC | PRN

## 2016-11-23 MED ORDER — ASPIRIN EC 325 MG PO TBEC
325.0000 mg | DELAYED_RELEASE_TABLET | Freq: Every day | ORAL | Status: DC
  Administered 2016-11-24 – 2016-11-25 (×2): 325 mg via ORAL
  Filled 2016-11-23 (×2): qty 1

## 2016-11-23 MED ORDER — STROKE: EARLY STAGES OF RECOVERY BOOK
Freq: Once | Status: AC
  Administered 2016-11-24: 01:00:00
  Filled 2016-11-23: qty 1

## 2016-11-23 MED ORDER — ENOXAPARIN SODIUM 40 MG/0.4ML ~~LOC~~ SOLN
40.0000 mg | SUBCUTANEOUS | Status: DC
  Administered 2016-11-24 – 2016-11-25 (×2): 40 mg via SUBCUTANEOUS
  Filled 2016-11-23 (×2): qty 0.4

## 2016-11-23 MED ORDER — INSULIN ASPART 100 UNIT/ML ~~LOC~~ SOLN: 0.0000 [IU] | Freq: Every day | SUBCUTANEOUS | Status: DC

## 2016-11-23 MED ORDER — INSULIN ASPART 100 UNIT/ML ~~LOC~~ SOLN
0.0000 [IU] | Freq: Three times a day (TID) | SUBCUTANEOUS | Status: DC
Start: 2016-11-24 — End: 2016-11-25

## 2016-11-23 MED ORDER — ACETAMINOPHEN 325 MG PO TABS: 650.0000 mg | ORAL_TABLET | ORAL | Status: DC | PRN

## 2016-11-23 MED ORDER — SODIUM CHLORIDE 0.9 % IV SOLN
INTRAVENOUS | Status: DC
  Administered 2016-11-24 – 2016-11-25 (×3): via INTRAVENOUS

## 2016-11-23 NOTE — ED Triage Notes (Signed)
Pt presents to the ed for complaints of dizziness states she feels like she is spinning, also complains of nausea. Reports having stroke 3 moths ago and just not feeling right ever since then. Alert and oriented, no unilateral weakness. Mild right sided facial droop that the patient states has been there since her last stroke. edp called to rule out code stroke, advised not a code stroke.

## 2016-11-23 NOTE — ED Provider Notes (Signed)
Emergency Department Provider Note   I have reviewed the triage vital signs and the nursing notes.   HISTORY  Chief Complaint Dizziness   HPI Sherri [name] is a 76 y.o. female with PMH of DM, HLD, and HTN with h/o prior CVA in 07/2015 presents to the emergency department for evaluation of persistent vertigo, slurred speech, gait instability that has since resolved. The patient states that she awoke this morning with vertigo sensation which persisted throughout the day. The patient's brother drove down from [place] which point he witnessed her to have significant slurred speech and altered mental status. She is complaining of some mild headache and he noticed her to have a staggering gait which is unusual for her. Over the last month she has been living here in [place]. The brother states that she has not been taking any of her medication including medications for stroke. He feels that she is losing weight and not eating very much. They called paramedics and symptoms seemed to resolve entirely by the time they reached the emergency department. At this time he feels that she is acting like her normal self.   The patient denies any headache, vertigo, weakness/numbness. No notable voice changes. No gait instability.   Past Medical History:  Diagnosis Date  . Diabetes mellitus without complication (HCC)   . Hyperlipidemia   . Hypertension   . Stroke [facility])     Patient Active Problem List   Diagnosis Date Noted  . Hypokalemia 11/24/2016  . History of CVA with residual deficit 11/24/2016  . Vertigo 11/23/2016  . Dementia 03/23/2016  . Stress at home 01/26/2016  . History of TIA (transient ischemic attack)   . CKD (chronic kidney disease)   . Acute CVA (cerebrovascular accident) (HCC) 08/03/2015  . CVA (cerebral infarction) 08/03/2015  . Diabetes mellitus type 2 in nonobese (HCC) 08/03/2015  . Dizziness   . Encounter for medication counseling 07/03/2015  . Adjustment  disorder with mixed anxiety and depressed mood 09/16/2014  . Hyperlipidemia   . Hypertension     Past Surgical History:  Procedure Laterality Date  . EP IMPLANTABLE DEVICE N/A 08/06/2015   Procedure: Loop Recorder Insertion;  Surgeon: Hillis Range, MD;  Location: [facility] INVASIVE CV LAB;  Service: Cardiovascular;  Laterality: N/A;  . TEE WITHOUT CARDIOVERSION N/A 08/06/2015   Procedure: TRANSESOPHAGEAL ECHOCARDIOGRAM (TEE);  Surgeon: Lars Masson, MD;  Location: [facility] ENDOSCOPY;  Service: Cardiovascular;  Laterality: N/A;      Allergies Hydrogen peroxide and Sulfa antibiotics  Family History  Problem Relation Age of Onset  . Hypertension Mother   . Hypertension Father     Social History Social History  Substance Use Topics  . Smoking status: Never Smoker  . Smokeless tobacco: Never Used  . Alcohol use No    Review of Systems  Constitutional: No fever/chills Eyes: No visual changes. ENT: No sore throat. Cardiovascular: Denies chest pain. Respiratory: Denies shortness of breath. Gastrointestinal: No abdominal pain.  No nausea, no vomiting.  No diarrhea.  No constipation. Genitourinary: Negative for dysuria. Musculoskeletal: Negative for back pain. Skin: Negative for rash. Neurological: Negative for headaches, focal weakness or numbness. Positive vertigo and family report of slurred speech and gait instability.   10-point ROS otherwise negative.  ____________________________________________   PHYSICAL EXAM:  VITAL SIGNS: ED Triage Vitals [11/23/16 1906]  Enc Vitals Group     BP (!) 186/103     Pulse Rate 66     Resp 20  Temp 98.7 F (37.1 C)     Temp Source Oral     SpO2 100 %     Weight 135 lb (61.2 kg)     Height 5\' 6"  (1.676 m)   Constitutional: Alert and oriented. Well appearing and in no acute distress. Eyes: Conjunctivae are normal. PERRL. EOMI. Head: Atraumatic. Nose: No congestion/rhinnorhea. Mouth/Throat: Mucous membranes are moist.  Neck: No  stridor.   Cardiovascular: Normal rate, regular rhythm. Good peripheral circulation. Grossly normal heart sounds.   Respiratory: Normal respiratory effort.  No retractions. Lungs CTAB. Gastrointestinal: Soft and nontender. No distention.  Musculoskeletal: No lower extremity tenderness nor edema. No gross deformities of extremities. Neurologic:  Normal speech and language. Normal CN exam 2-12. No deficits with finger-to-nose testing. 4/5 LUE with 5/5 in other extremities.  Skin:  Skin is warm, dry and intact. No rash noted. Psychiatric: Mood and affect are normal. Speech and behavior are normal.  ____________________________________________   LABS (all labs ordered are listed, but only abnormal results are displayed)  Labs Reviewed  COMPREHENSIVE METABOLIC PANEL - Abnormal; Notable for the following:       Result Value   Potassium 3.2 (*)    Creatinine, Ser 1.18 (*)    GFR calc non Af Amer 43 (*)    GFR calc Af Amer 50 (*)    All other components within normal limits  LIPID PANEL - Abnormal; Notable for the following:    Cholesterol 209 (*)    LDL Cholesterol 148 (*)    All other components within normal limits  GLUCOSE, CAPILLARY - Abnormal; Notable for the following:    Glucose-Capillary 112 (*)    All other components within normal limits  GLUCOSE, CAPILLARY - Abnormal; Notable for the following:    Glucose-Capillary 105 (*)    All other components within normal limits  I-STAT CHEM 8, ED - Abnormal; Notable for the following:    Potassium 3.3 (*)    Creatinine, Ser 1.10 (*)    Glucose, Bld 102 (*)    Calcium, Ion 1.12 (*)    All other components within normal limits  PROTIME-INR  APTT  CBC  DIFFERENTIAL  CBC  GLUCOSE, CAPILLARY  GLUCOSE, CAPILLARY  HEMOGLOBIN A1C  I-STAT TROPOININ, ED   ____________________________________________  EKG   EKG Interpretation  Date/Time:  Tuesday November 23 2016 20:47:49 EDT Ventricular Rate:  64 PR Interval:    QRS  Duration: 90 QT Interval:  470 QTC Calculation: 485 R Axis:   -10 Text Interpretation:  Sinus rhythm Short PR interval LVH by voltage Confirmed by Bethann BerkshireZammit, Joseph [phone]) on 11/24/2016 11:08:06 AM       ____________________________________________  RADIOLOGY  Ct Head Wo Contrast  Result Date: 11/23/2016 CLINICAL DATA:  76 year old female with history of stroke 3 months ago presenting with dizziness and nausea. EXAM: CT HEAD WITHOUT CONTRAST TECHNIQUE: Contiguous axial images were obtained from the base of the skull through the vertex without intravenous contrast. COMPARISON:  Head CT dated 04/09/2016 FINDINGS: Brain: There is moderate age-related atrophy and chronic microvascular ischemic changes. Areas of old infarct and encephalomalacia noted in the right frontal and temporal lobes as well as left posterior parietal convexity similar to prior CT. There is no acute intracranial hemorrhage. No mass effect or midline shift. No extra-axial fluid collection. Vascular: No hyperdense vessel or unexpected calcification. Skull: Normal. Negative for fracture or focal lesion. Sinuses/Orbits: No acute finding. Other: None IMPRESSION: 1. No acute intracranial hemorrhage. 2. Age-related atrophy and chronic microvascular ischemic  changes as well as areas of chronic infarct. Electronically Signed   By: Elgie Collard M.D.   On: 11/23/2016 20:09    ____________________________________________   PROCEDURES  Procedure(s) performed:   Procedures  None ____________________________________________   INITIAL IMPRESSION / ASSESSMENT AND PLAN / ED COURSE  Pertinent labs & imaging results that were available during my care of the patient were reviewed by me and considered in my medical decision making (see chart for details).  Patient resents to the emergency room in for evaluation of persistent vertigo, speech change, gait instability that is since resolved. Symptoms concerning for TIA. The patient had  labs drawn in triage and CT ordered with no acute findings. The patient does have a cardiac loop recorder of unknown MRI compliance. The patient's last CVA was 07/2015 here at [facility]. With concern for TIA and medication non-compliance I will discuss admission with the hospitalist.   Discussed patient's case with Hospitlaist, Dr. Katrinka Blazing. Patient and family (if present) updated with plan. Care transferred to Hospitalist service.  I reviewed all nursing notes, vitals, pertinent old records, EKGs, labs, imaging (as available).  ____________________________________________  FINAL CLINICAL IMPRESSION(S) / ED DIAGNOSES  Final diagnoses:  Vertigo  Other specified transient cerebral ischemias     MEDICATIONS GIVEN DURING THIS VISIT:  Medications  0.9 %  sodium chloride infusion ( Intravenous New Bag/Given 11/24/16 1338)  acetaminophen (TYLENOL) tablet 650 mg (not administered)    Or  acetaminophen (TYLENOL) solution 650 mg (not administered)    Or  acetaminophen (TYLENOL) suppository 650 mg (not administered)  senna-docusate (Senokot-S) tablet 1 tablet (not administered)  enoxaparin (LOVENOX) injection 40 mg (40 mg Subcutaneous Given 11/24/16 1212)  rosuvastatin (CRESTOR) tablet 5 mg (5 mg Oral Given 11/24/16 1212)  amLODipine (NORVASC) tablet 10 mg (10 mg Oral Given 11/24/16 1212)  aspirin EC tablet 325 mg (325 mg Oral Given 11/24/16 1212)  insulin aspart (novoLOG) injection 0-9 Units (0 Units Subcutaneous Not Given 11/24/16 1258)  insulin aspart (novoLOG) injection 0-5 Units (0 Units Subcutaneous Not Given 11/23/16 2300)  potassium chloride SA (K-DUR,KLOR-CON) CR tablet 40 mEq (40 mEq Oral Given 11/23/16 2224)   stroke: mapping our early stages of recovery book ( Does not apply Given 11/24/16 0112)  potassium chloride SA (K-DUR,KLOR-CON) CR tablet 40 mEq (40 mEq Oral Given 11/24/16 1212)     NEW OUTPATIENT MEDICATIONS STARTED DURING THIS VISIT:  None   Note:  This document was prepared using  Dragon voice recognition software and may include unintentional dictation errors.  Alona Bene, MD Emergency Medicine   Balthazar Dooly, Arlyss Repress, MD 11/24/16 1344

## 2016-11-23 NOTE — H&P (Signed)
History and Physical    Sherri [name] AVW:[id number] DOB: May 11, 1941 DOA: 11/23/2016  Referring MD/NP/PA: Alona Bene, MD PCP: Dianne Dun, MD  Patient coming from: Home. EMS  Chief Complaint: Dizziness  HPI: Sherri [name] is a 76 y.o. female with medical history significant of HTN, HLD, DM type II, CVA with residual left facial droop; who presents with complaints of dizziness since this morning. Patient reports waking up with a aching headache all over and reports of the room spinning around her. Normally patient gets around with the use of a cane. However, patient significantly off balance and able to ambulate with a normal. Patient previously had been living up in the [place]/ [place] area with her brother, but had come back down to [place] to be close to family around Mother's Day. Since that time the brother makes note that the patient has not been taking any of her medications as family members are not actively participating in her care. Today while visiting her at her house from [place] he found her in a chair and noticed that she was unable to get up and her speech was more slurred than normal. He reports that no one has been making sure that the patient is going to her doctor's appointments or taking her medicines.  at the house he noted that some of her medications were in the wrong time slot.  He feels that she needs to have some kind of home health aide to come and check to make sure that her medicines are being given. Patient lies had a stroke in February 2018 in [place] where patient has had a residual left sided weakness and some slurred speech at baseline.  ED Course: En route  with EMS the patient was given aspirin. Patient was initially called a code stroke but this was canceled after being evaluated by the ED physician.   initial vital signs are relatively unremarkable except for blood pressures elevated at 186/103. Labs revealed potassium of 3.2, creatinine 1.18. Initial CT scan of  the brain was negative for any signs of stroke. The ED physician ordered for MRI to be obtained, but family declined this as patient has been told previously in the past that she cannot have MRIs due to this loop recorder. Discussed with family that appears to loop recorder may be compatible with our MRI, but they still declined as they know she's had strokes before. Patient now reports resolution of headache and dizziness symptoms.  Review of Systems: As per HPI otherwise 10 point review of systems negative.   Past Medical History:  Diagnosis Date  . Diabetes mellitus without complication (HCC)   . Hyperlipidemia   . Hypertension   . Stroke [facility])     Past Surgical History:  Procedure Laterality Date  . EP IMPLANTABLE DEVICE N/A 08/06/2015   Procedure: Loop Recorder Insertion;  Surgeon: Hillis Range, MD;  Location: [facility] INVASIVE CV LAB;  Service: Cardiovascular;  Laterality: N/A;  . TEE WITHOUT CARDIOVERSION N/A 08/06/2015   Procedure: TRANSESOPHAGEAL ECHOCARDIOGRAM (TEE);  Surgeon: Lars Masson, MD;  Location: [facility] ENDOSCOPY;  Service: Cardiovascular;  Laterality: N/A;     reports that she has never smoked. She has never used smokeless tobacco. She reports that she does not drink alcohol or use drugs.  Allergies  Allergen Reactions  . Hydrogen Peroxide Swelling    Swelling at site applied  . Sulfa Antibiotics     Reaction not recalled by patient    No family history on  file.  Prior to Admission medications   Medication Sig Start Date End Date Taking? Authorizing Provider  aspirin EC 325 MG EC tablet Take 1 tablet (325 mg total) by mouth daily. 08/07/15  Yes Penny Pia, MD  traMADol (ULTRAM) 50 MG tablet Take 50 mg by mouth every 6 (six) hours as needed (for pain).   Yes [provider]  amLODipine (NORVASC) 10 MG tablet TAKE 1 TABLET (10 MG TOTAL) BY MOUTH DAILY. Patient not taking: Reported on 11/23/2016 01/30/16   Dianne Dun, MD  gabapentin (NEURONTIN) 100 MG capsule  Take 200 mg by mouth at bedtime. 10/06/16   [provider]  Glucosamine-Chondroit-Vit C-Mn (GLUCOSAMINE 1500 COMPLEX) CAPS Take 1 capsule by mouth daily.     [provider]  mirtazapine (REMERON) 15 MG tablet Take 1 tablet (15 mg total) by mouth at bedtime. Patient not taking: Reported on 11/23/2016 10/07/16   Dianne Dun, MD  Omega-3 Fatty Acids (FISH OIL) 1000 MG CAPS Take 1 capsule by mouth daily.      [provider]  rosuvastatin (CRESTOR) 5 MG tablet TAKE 1 TABLET (5 MG TOTAL) BY MOUTH DAILY. Patient not taking: Reported on 11/23/2016 11/17/16   Dianne Dun, MD    Physical Exam:   Constitutional: Elderly female who appears to be in no acute distress Vitals:   11/23/16 2047 11/23/16 2100 11/23/16 2115 11/23/16 2130  BP:  (!) 170/94 (!) 163/85 (!) 181/98  Pulse:  72 69 69  Resp:  16 12 14   Temp: 98.6 F (37 C)     TempSrc:      SpO2:  97% 97% 99%  Weight:      Height:       Eyes: PERRL, lids and conjunctivae normal ENMT: Mucous membranes are moist. Posterior pharynx clear of any exudate or lesions.  Neck: normal, supple, no masses, no thyromegaly Respiratory: clear to auscultation bilaterally, no wheezing, no crackles. Normal respiratory effort. No accessory muscle use.  Cardiovascular: Regular rate and rhythm, no murmurs / rubs / gallops. No extremity edema. 2+ pedal pulses. No carotid bruits.  Abdomen: no tenderness, no masses palpated. No hepatosplenomegaly. Bowel sounds positive.  Musculoskeletal: no clubbing / cyanosis. No joint deformity upper and lower extremities. Good ROM, no contractures. Normal muscle tone.  Skin: no rashes, lesions, ulcers. No induration Neurologic: CN 2-12 grossly intact. Sensation intact, DTR normal. Chronic left-sided weakness with dysarthria Psychiatric: Normal judgment and insight. Alert and oriented x 3. Normal mood.     Labs on Admission: I have personally reviewed following labs and imaging  studies  CBC:  Recent Labs Lab 11/23/16 1928 11/23/16 1944  WBC 6.9  --   NEUTROABS 4.2  --   HGB 12.4 13.9  HCT 40.6 41.0  MCV 91.0  --   PLT 207  --    Basic Metabolic Panel:  Recent Labs Lab 11/23/16 1928 11/23/16 1944  NA 142 143  K 3.2* 3.3*  CL 107 107  CO2 26  --   GLUCOSE 99 102*  BUN 15 18  CREATININE 1.18* 1.10*  CALCIUM 9.4  --    GFR: Estimated Creatinine Clearance: 40.1 mL/min (A) (by C-G formula based on SCr of 1.1 mg/dL (H)). Liver Function Tests:  Recent Labs Lab 11/23/16 1928  AST 19  ALT 16  ALKPHOS 81  BILITOT 0.3  PROT 7.2  ALBUMIN 3.9   No results for input(s): LIPASE, AMYLASE in the last 168 hours. No results for input(s): AMMONIA in  the last 168 hours. Coagulation Profile:  Recent Labs Lab 11/23/16 1928  INR 1.00   Cardiac Enzymes: No results for input(s): CKTOTAL, CKMB, CKMBINDEX, TROPONINI in the last 168 hours. BNP (last 3 results) No results for input(s): PROBNP in the last 8760 hours. HbA1C: No results for input(s): HGBA1C in the last 72 hours. CBG: No results for input(s): GLUCAP in the last 168 hours. Lipid Profile: No results for input(s): CHOL, HDL, LDLCALC, TRIG, CHOLHDL, LDLDIRECT in the last 72 hours. Thyroid Function Tests: No results for input(s): TSH, T4TOTAL, FREET4, T3FREE, THYROIDAB in the last 72 hours. Anemia Panel: No results for input(s): VITAMINB12, FOLATE, FERRITIN, TIBC, IRON, RETICCTPCT in the last 72 hours. Urine analysis:    Component Value Date/Time   COLORURINE YELLOW 04/09/2016 1042   APPEARANCEUR CLEAR 04/09/2016 1042   LABSPEC 1.006 04/09/2016 1042   PHURINE 7.5 04/09/2016 1042   GLUCOSEU NEGATIVE 04/09/2016 1042   HGBUR NEGATIVE 04/09/2016 1042   BILIRUBINUR NEGATIVE 04/09/2016 1042   KETONESUR NEGATIVE 04/09/2016 1042   PROTEINUR 30 (A) 04/09/2016 1042   UROBILINOGEN 0.2 10/13/2010 1647   NITRITE NEGATIVE 04/09/2016 1042   LEUKOCYTESUR SMALL (A) 04/09/2016 1042   Sepsis  Labs: No results found for this or any previous visit (from the past 240 hour(s)).   Radiological Exams on Admission: Ct Head Wo Contrast  Result Date: 11/23/2016 CLINICAL DATA:  76 year old female with history of stroke 3 months ago presenting with dizziness and nausea. EXAM: CT HEAD WITHOUT CONTRAST TECHNIQUE: Contiguous axial images were obtained from the base of the skull through the vertex without intravenous contrast. COMPARISON:  Head CT dated 04/09/2016 FINDINGS: Brain: There is moderate age-related atrophy and chronic microvascular ischemic changes. Areas of old infarct and encephalomalacia noted in the right frontal and temporal lobes as well as left posterior parietal convexity similar to prior CT. There is no acute intracranial hemorrhage. No mass effect or midline shift. No extra-axial fluid collection. Vascular: No hyperdense vessel or unexpected calcification. Skull: Normal. Negative for fracture or focal lesion. Sinuses/Orbits: No acute finding. Other: None IMPRESSION: 1. No acute intracranial hemorrhage. 2. Age-related atrophy and chronic microvascular ischemic changes as well as areas of chronic infarct. Electronically Signed   By: Elgie Collard M.D.   On: 11/23/2016 20:09    EKG: Independently reviewed. Sinus rhythm  Assessment/Plan Vertigo with headache: Acute. Patient presents with acute headache and dizziness. Question for concern of TIA as symptoms are now resolved. - Admit to a telemetry bed - Neuro checks - Check lipid panel and hemoglobin A1c in a.m. - PT/OT/ speech  to eval and treat - Check echocardiogram and vascular duplex Doppler ultrasound in a.m.  - Continue aspirin  - May want to try and interrogate loop recorder - Case management and social worker consult for need of home health and physical therapy - May warrant neuro evaluation if symptoms return   H/O CVA with residual left-sided weakness - Continue aspirin  Hypokalemia: Acute. Initial potassium 3.2  on admission - Give 40 mEq of potassium chloride 1 dose now  Essential hypertension uncontrolled: Blood pressures initially elevated up to 183/103 on admission. - Restart home medications of amlodipine  Diabetes mellitus type 2 - Hypoglycemic protocol - Check hemoglobin A1c  - CBGs every before meals and at bedtime with sensitive sliding scale insulin  Hyperlipidemia - Follow-up lipid panel - Restart previous home medication of pravastatin  Chronic kidney disease stage III: Stable - Continued to monitor   DVT prophylaxis: Lovenox  Code Status:  full Family Communication: Discussed plan of care with the patient and her brother who is present at bedside  Disposition Plan: To be determined  Consults called: none Admission status: observation  Clydie Braunondell A Aggie Douse MD [facility] Pager [phone]  If 7PM-7AM, please contact night-coverage [email or web address] Password [email or web address]  11/23/2016, 10:12 PM

## 2016-11-24 ENCOUNTER — Encounter (HOSPITAL_COMMUNITY): Payer: Self-pay | Admitting: Neurology

## 2016-11-24 ENCOUNTER — Observation Stay (HOSPITAL_BASED_OUTPATIENT_CLINIC_OR_DEPARTMENT_OTHER): Payer: Medicare Other

## 2016-11-24 DIAGNOSIS — R42 Dizziness and giddiness: Secondary | ICD-10-CM

## 2016-11-24 DIAGNOSIS — E876 Hypokalemia: Secondary | ICD-10-CM | POA: Diagnosis present

## 2016-11-24 DIAGNOSIS — N184 Chronic kidney disease, stage 4 (severe): Secondary | ICD-10-CM

## 2016-11-24 DIAGNOSIS — I6930 Unspecified sequelae of cerebral infarction: Secondary | ICD-10-CM

## 2016-11-24 DIAGNOSIS — E119 Type 2 diabetes mellitus without complications: Secondary | ICD-10-CM | POA: Diagnosis not present

## 2016-11-24 LAB — LIPID PANEL
Cholesterol: 209 mg/dL — ABNORMAL HIGH (ref 0–200)
HDL: 47 mg/dL (ref >40)
LDL CALC: 148 mg/dL — AB (ref 0–99)
TRIGLYCERIDES: 68 mg/dL (ref <150)
Total CHOL/HDL Ratio: 4.4 RATIO
VLDL: 14 mg/dL (ref 0–40)

## 2016-11-24 LAB — CBC
HCT: 39.8 % (ref 36.0–46.0)
Hemoglobin: 12.3 g/dL (ref 12.0–15.0)
MCH: 27.9 pg (ref 26.0–34.0)
MCHC: 30.9 g/dL (ref 30.0–36.0)
MCV: 90.2 fL (ref 78.0–100.0)
Platelets: 208 10*3/uL (ref 150–400)
RBC: 4.41 MIL/uL (ref 3.87–5.11)
RDW: 14 % (ref 11.5–15.5)
WBC: 6 10*3/uL (ref 4.0–10.5)

## 2016-11-24 LAB — VAS US CAROTID
LCCADDIAS: -11 cm/s
LCCADSYS: -52 cm/s
LCCAPDIAS: 15 cm/s
LCCAPSYS: 109 cm/s
LEFT ECA DIAS: -7 cm/s
LEFT VERTEBRAL DIAS: 5 cm/s
LICADDIAS: 24 cm/s
LICADSYS: 87 cm/s
Left ICA prox dias: -11 cm/s
Left ICA prox sys: -31 cm/s
RCCADSYS: -32 cm/s
RCCAPSYS: 98 cm/s
RIGHT ECA DIAS: -6 cm/s
RIGHT VERTEBRAL DIAS: -8 cm/s
Right CCA prox dias: 12 cm/s

## 2016-11-24 LAB — GLUCOSE, CAPILLARY
GLUCOSE-CAPILLARY: 77 mg/dL (ref 65–99)
Glucose-Capillary: 105 mg/dL — ABNORMAL HIGH (ref 65–99)
Glucose-Capillary: 112 mg/dL — ABNORMAL HIGH (ref 65–99)
Glucose-Capillary: 92 mg/dL (ref 65–99)
Glucose-Capillary: 103 mg/dL — ABNORMAL HIGH (ref 65–99)
Glucose-Capillary: 88 mg/dL (ref 65–99)

## 2016-11-24 MED ORDER — POTASSIUM CHLORIDE CRYS ER 20 MEQ PO TBCR
40.0000 meq | EXTENDED_RELEASE_TABLET | Freq: Once | ORAL | Status: AC
  Administered 2016-11-24: 40 meq via ORAL
  Filled 2016-11-24: qty 2

## 2016-11-24 NOTE — Evaluation (Signed)
Physical Therapy Evaluation Patient Details Name: Sherri [name] MRN: [id number] DOB: 07/27/1940 Today's Date: 11/24/2016   History of Present Illness  Pt is a 76 yo female admitted to the ED for slurred speech, dizziness, and headache. 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 CT was negative, and pt's family declined MRI.   Clinical Impression  Pt presents with decreased functional mobility with limitations listed below. Prior to admission pt was using a cane for mobility, and requires assistance taking medications and driving. Pt has residual L sided weakness and speech deficits. Pt required BUE support with IV pole and 1 HHA during gait and presented with R/L drift with VCs to correct. Pt did not report any dizziness during mobility. Pt would benefit from cont skilled PT to improve balance, strength, and improve functional mobility. PT will follow acutely.    Follow Up Recommendations Home health PT;Supervision/Assistance - 24 hour    Equipment Recommendations  None recommended by PT    Recommendations for Other Services       Precautions / Restrictions Precautions Precautions: Fall Restrictions Weight Bearing Restrictions: No      Mobility  Bed Mobility Overal bed mobility: Needs Assistance Bed Mobility: Supine to Sit     Supine to sit: Supervision     General bed mobility comments: Increased time and use of bed rails. No reports of dizziness upon standign. Supervision for safety   Transfers Overall transfer level: Needs assistance Equipment used: None Transfers: Sit to/from Stand Sit to Stand: Min assist         General transfer comment: Min A for power up. Pt requires single UE support for balance upon standing.   Ambulation/Gait Ambulation/Gait assistance: Min Elsa Ploch Ambulation Distance (Feet): 60 Feet Assistive device: 1 person hand held assist (IV pole with BUE) Gait  Pattern/deviations: Step-through pattern;Decreased stride length;Drifts right/left Gait velocity: decreased Gait velocity interpretation: Below normal speed for age/gender General Gait Details: Pt requires single UE support during gait due to poor balance. Pt denies dizziness during ambulation, but frequently drifts toward L and requires VCs to correct.   Stairs            Wheelchair Mobility    Modified Rankin (Stroke Patients Only)       Balance Overall balance assessment: Needs assistance Sitting-balance support: No upper extremity supported;Feet supported Sitting balance-Leahy Scale: Fair     Standing balance support: Single extremity supported;During functional activity Standing balance-Leahy Scale: Poor Standing balance comment: Pt requires UE support during gait                              Pertinent Vitals/Pain Pain Assessment: No/denies pain    Home Living Family/patient expects to be discharged to:: Private residence Living Arrangements: Spouse/significant other Available Help at Discharge: Family Type of Home: House Home Access: Stairs to enter Entrance Stairs-Rails: None Entrance Stairs-Number of Steps: 2 Home Layout: One level Home Equipment: Cane - single point;Bedside commode      Prior Function Level of Independence: Needs assistance   Gait / Transfers Assistance Needed: requires use of SPC   ADL's / Homemaking Assistance Needed: Per pt's chart, pt requires assistance with taking medications and driving to doctor's appointments.   Comments: No family present to validate pt's PLOF     Hand Dominance   Dominant Hand: Right    Extremity/Trunk Assessment   Upper Extremity  Assessment Upper Extremity Assessment: Defer to OT evaluation    Lower Extremity Assessment Lower Extremity Assessment: Generalized weakness;RLE deficits/detail;LLE deficits/detail RLE Deficits / Details: Pt with generalized LE weakness L>R.  LLE Deficits /  Details: Pt with generalized LE weakness L>R.  Residual L sided weakness from prior stroke       Communication   Communication: Expressive difficulties  Cognition Arousal/Alertness: Lethargic Behavior During Therapy: Flat affect Overall Cognitive Status: No family/caregiver present to determine baseline cognitive functioning                                 General Comments: Pt requires increased time for word finding at times.       General Comments      Exercises     Assessment/Plan    PT Assessment Patient needs continued PT services  PT Problem List Decreased strength;Decreased activity tolerance;Decreased balance;Decreased mobility;Decreased knowledge of use of DME;Decreased safety awareness;Decreased knowledge of precautions       PT Treatment Interventions DME instruction;Gait training;Stair training;Functional mobility training;Therapeutic activities;Therapeutic exercise;Balance training;Neuromuscular re-education;Patient/family education    PT Goals (Current goals can be found in the Care Plan section)  Acute Rehab PT Goals Patient Stated Goal: to go home PT Goal Formulation: With patient Time For Goal Achievement: 12/08/16 Potential to Achieve Goals: Good    Frequency Min 3X/week   Barriers to discharge        Co-evaluation               AM-PAC PT "6 Clicks" Daily Activity  Outcome Measure Difficulty turning over in bed (including adjusting bedclothes, sheets and blankets)?: None Difficulty moving from lying on back to sitting on the side of the bed? : A Little Difficulty sitting down on and standing up from a chair with arms (e.g., wheelchair, bedside commode, etc,.)?: Total Help needed moving to and from a bed to chair (including a wheelchair)?: A Little Help needed walking in hospital room?: A Little Help needed climbing 3-5 steps with a railing? : A Lot 6 Click Score: 16    End of Session Equipment Utilized During Treatment: Gait  belt Activity Tolerance: Patient tolerated treatment well Patient left: in chair;with call bell/phone within reach;with nursing/sitter in room Nurse Communication: Mobility status PT Visit Diagnosis: History of falling (Z91.81);Difficulty in walking, not elsewhere classified (R26.2);Other symptoms and signs involving the nervous system (R29.898)    Time: [id number] PT Time Calculation (min) (ACUTE ONLY): 17 min   Charges:   PT Evaluation $PT Eval Moderate Complexity: 1 Procedure     PT G Codes:   PT G-Codes **NOT FOR INPATIENT CLASS** Functional Assessment Tool Used: Clinical judgement Functional Limitation: Mobility: Walking and moving around Mobility: Walking and Moving Around Current Status (G2694(G8978): At least 20 percent but less than 40 percent impaired, limited or restricted Mobility: Walking and Moving Around Goal Status [phone](G8979): At least 1 percent but less than 20 percent impaired, limited or restricted    Corlis Leakaylor [place]abene, SPT  [phone]  Corlis Leakaylor Guardalabene 11/24/2016, 9:58 AM

## 2016-11-24 NOTE — Care Management Obs Status (Signed)
MEDICARE OBSERVATION STATUS NOTIFICATION   Patient Details  Name: Sherri [name] MRN: [id number] Date of Birth: 08/08/1940   Medicare Observation Status Notification Given:  Yes    Kermit BaloKelli F Steph Cheadle, RN 11/24/2016, 10:46 AM

## 2016-11-24 NOTE — Progress Notes (Addendum)
NURSING PROGRESS NOTE  Elijah BirkCreola S Kehres [id number] Admission Data: 11/24/2016 5:43 AM Attending Provider: Clydie BraunSmith, Rondell A, MD WUJ:[id number]:Aron, Bryn Gullingalia M, MD Code Status: Full  Elijah BirkCreola S Levert is a 76 y.o. female patient admitted from ED:  -No acute distress noted.  -No complaints of shortness of breath.  -No complaints of chest pain.   Cardiac Monitoring: Box # 4 in place. Cardiac monitor yields:normal sinus rhythm.  Blood pressure (!) 156/63, pulse 71, temperature 98.1 F (36.7 C), temperature source Oral, resp. rate 16, height 5\' 5"  (1.651 m), weight 64.6 kg (142 lb 6.4 oz), SpO2 96 %.   IV Fluids:  IV in place, occlusive dsg intact without redness, IV cath forearm right, condition patent and no redness Started normal saline at 1375ml/hr.   Allergies:  Hydrogen peroxide and Sulfa antibiotics  Past Medical History:   has a past medical history of Diabetes mellitus without complication (HCC); Hyperlipidemia; Hypertension; and Stroke [facility](HCC).  Past Surgical History:   has a past surgical history that includes Cardiac catheterization (N/A, 08/06/2015) and TEE without cardioversion (N/A, 08/06/2015).  Social History:   reports that she has never smoked. She has never used smokeless tobacco. She reports that she does not drink alcohol or use drugs.  Skin: Clean, Dry & Intact  Patient/Family orientated to room. Information packet given to patient/family. Admission inpatient armband information verified with patient/family to include name and date of birth and placed on patient arm. Side rails up x 2, fall assessment and education completed with patient/family. Patient/family able to verbalize understanding of risk associated with falls and verbalized understanding to call for assistance before getting out of bed. Call light within reach. Patient/family able to voice and demonstrate understanding of unit orientation instructions.    Unable to complete a full assessment due to patient's unclear  recollection. Will await Husband's visit to complete the assessment to assure proper history is obtained  Will continue to evaluate and treat per MD orders.

## 2016-11-24 NOTE — Progress Notes (Signed)
VASCULAR LAB PRELIMINARY  PRELIMINARY  PRELIMINARY  PRELIMINARY  Carotid duplex completed.    Preliminary report:  Bilateral:  1-39% ICA stenosis.  Vertebral artery flow is antegrade.     Tremel Setters, RVS 11/24/2016, 11:38 AM

## 2016-11-24 NOTE — Consult Note (Signed)
Requesting Physician: Dr. Susie Cassette    Chief Complaint: Dizziness  History obtained from:  Patient   Chart    HPI:                                                                                                                                         Sherri [name] is an 76 y.o. female with "medical history significant of HTN, HLD, DM type II, CVA with residual left facial droop; who presents with complaints of dizziness since morning of 6.20.2018. Patient reports waking up with a aching headache all over and reports of the room spinning around her. Normally patient gets around with the use of a cane. However, patient significantly off balance and able to ambulate with a normal. Patient previously had been living up in the [place]/ [place] area with her brother, but had come back down to [place] to be close to family around Mother's Day. Since mothersday the brother makes note that the patient has not been taking any of her medications as family members are not actively participating in her care. Today while visiting her at her house from [place] he found her in a chair and noticed that she was unable to get up and her speech was more slurred than normal. He reports that no one has been making sure that the patient is going to her doctor's appointments or taking her medicines.  at the house he noted that some of her medications were in the wrong time slot.  Patient lies had a stroke in February 2018 in [place] where patient has had a residual left sided weakness and some slurred speech at baseline. "  On consultation it is noticeable that the patient is a very poor historian. Patient states that she felt dizzy, but after several minutes of trying to tease out what dizzy meant she actually meant the room was spinning. She cannot recall which with room was spinning left or right or right to left. And she stated that her head was ringing. When I asked what reading meant she could not describe this but she did not mean  an actual ringing sound. I could not tease out of her what ringing meant. What I could gather is that the spinning was worse when she had head movement or body movement and better when she laid still. She had no nausea or vomiting involved. She did not think that she had any abnormal speech however her brother did. The symptoms lasted for short period of time until she was brought into the ambulance. And at this time she is back to her baseline. Her husband who is in the room apparently was sleeping during the whole event and did not know she was brought to the hospital. She denies any weakness of her arms or legs, any diplopia, blurred vision, numbness or tingling in extremities or face.  Date last known well:  Unable to determine Time last known well: Unable to determine tPA Given: No: out of window   Past Medical History:  Diagnosis Date  . Diabetes mellitus without complication (HCC)   . Hyperlipidemia   . Hypertension   . Stroke [facility](HCC)     Past Surgical History:  Procedure Laterality Date  . EP IMPLANTABLE DEVICE N/A 08/06/2015   Procedure: Loop Recorder Insertion;  Surgeon: Hillis RangeJames Allred, MD;  Location: [facility] INVASIVE CV LAB;  Service: Cardiovascular;  Laterality: N/A;  . TEE WITHOUT CARDIOVERSION N/A 08/06/2015   Procedure: TRANSESOPHAGEAL ECHOCARDIOGRAM (TEE);  Surgeon: Lars MassonKatarina H Nelson, MD;  [facility];  Service: Cardiovascular;  Laterality: N/A;    Family History  Problem Relation Age of Onset  . Hypertension Mother   . Hypertension Father    Social History:  reports that she has never smoked. She has never used smokeless tobacco. She reports that she does not drink alcohol or use drugs.  Allergies:  Allergies  Allergen Reactions  . Hydrogen Peroxide Swelling    Swelling at site applied  . Sulfa Antibiotics     Reaction not recalled by patient    Medications:                                                                                                                            Scheduled: . amLODipine  10 mg Oral Daily  . aspirin  325 mg Oral Daily  . enoxaparin (LOVENOX) injection  40 mg Subcutaneous Q24H  . insulin aspart  0-5 Units Subcutaneous QHS  . insulin aspart  0-9 Units Subcutaneous TID WC  . potassium chloride  40 mEq Oral Once  . rosuvastatin  5 mg Oral Daily    ROS:                                                                                                                                       History obtained from the patient  General ROS: negative for - chills, fatigue, fever, night sweats, weight gain or weight loss Psychological ROS: negative for - behavioral disorder, hallucinations, memory difficulties, mood swings or suicidal ideation Ophthalmic ROS: negative for - blurry vision, double vision, eye pain or loss of vision ENT ROS: negative for - epistaxis, nasal discharge, oral lesions, sore throat, tinnitus or vertigo Allergy and Immunology ROS: negative for - hives or itchy/watery eyes Hematological  and Lymphatic ROS: negative for - bleeding problems, bruising or swollen lymph nodes Endocrine ROS: negative for - galactorrhea, hair pattern changes, polydipsia/polyuria or temperature intolerance Respiratory ROS: negative for - cough, hemoptysis, shortness of breath or wheezing Cardiovascular ROS: negative for - chest pain, dyspnea on exertion, edema or irregular heartbeat Gastrointestinal ROS: negative for - abdominal pain, diarrhea, hematemesis, nausea/vomiting or stool incontinence Genito-Urinary ROS: negative for - dysuria, hematuria, incontinence or urinary frequency/urgency Musculoskeletal ROS: negative for - joint swelling or muscular weakness Neurological ROS: as noted in HPI Dermatological ROS: negative for rash and skin lesion changes  Neurologic Examination:                                                                                                      Blood pressure (!) 145/93, pulse 73, temperature 97.5 F  (36.4 C), temperature source Oral, resp. rate 16, height 5\' 5"  (1.651 m), weight 64.6 kg (142 lb 6.4 oz), SpO2 100 %.  HEENT-  Normocephalic, no lesions, without obvious abnormality.  Normal external eye and conjunctiva.  Normal TM's bilaterally.  Normal auditory canals and external ears. Normal external nose, mucus membranes and septum.  Normal pharynx. Cardiovascular- S1, S2 normal, pulses palpable throughout   Lungs- chest clear, no wheezing, rales, normal symmetric air entry Abdomen- normal findings: bowel sounds normal Extremities- no edema Lymph-no adenopathy palpable Musculoskeletal-no joint tenderness, deformity or swelling Skin-warm and dry, no hyperpigmentation, vitiligo, or suspicious lesions  Neurological Examination Mental Status: Alert, oriented to hospital and month.  Speech fluent without evidence of aphasia.  Able to follow 3 step commands without difficulty. Cranial Nerves: II: Visual fields grossly normal,  III,IV, VI: ptosis not present, extra-ocular motions intact bilaterally, pupils equal, round, reactive to light and accommodation--negative head thrust V,VII: smile symmetric, facial light touch sensation normal bilaterally VIII: hearing normal bilaterally IX,X: uvula rises symmetrically XI: bilateral shoulder shrug XII: midline tongue extension Motor: Right : Upper extremity   5/5    Left:     Upper extremity   4/5  Lower extremity   5/5     Lower extremity   5/5 Tone and bulk:normal tone throughout; no atrophy noted Sensory: Pinprick and light touch intact throughout, bilaterally Deep Tendon Reflexes: 1+ and symmetric throughout Plantars: Right: downgoing   Left: downgoing Cerebellar: normal finger-to-nose,  and normal heel-to-shin test Gait: Not tested       Lab Results: Basic Metabolic Panel:  Recent Labs Lab 11/23/16 1928 11/23/16 1944  NA 142 143  K 3.2* 3.3*  CL 107 107  CO2 26  --   GLUCOSE 99 102*  BUN 15 18  CREATININE 1.18* 1.10*   CALCIUM 9.4  --     Liver Function Tests:  Recent Labs Lab 11/23/16 1928  AST 19  ALT 16  ALKPHOS 81  BILITOT 0.3  PROT 7.2  ALBUMIN 3.9   No results for input(s): LIPASE, AMYLASE in the last 168 hours. No results for input(s): AMMONIA in the last 168 hours.  CBC:  Recent Labs Lab 11/23/16 1928 11/23/16 1944 11/24/16 0502  WBC 6.9  --  6.0  NEUTROABS 4.2  --   --   HGB 12.4 13.9 12.3  HCT 40.6 41.0 39.8  MCV 91.0  --  90.2  PLT 207  --  208    Cardiac Enzymes: No results for input(s): CKTOTAL, CKMB, CKMBINDEX, TROPONINI in the last 168 hours.  Lipid Panel:  Recent Labs Lab 11/24/16 0502  CHOL 209*  TRIG 68  HDL 47  CHOLHDL 4.4  VLDL 14  LDLCALC 130*    CBG:  Recent Labs Lab 11/23/16 2342 11/23/16 2351 11/24/16 0607  GLUCAP 105* 112* 77    Microbiology: Results for orders placed or performed during the hospital encounter of 03/26/16  Urine culture     Status: Abnormal   Collection Time: 03/26/16  6:45 AM  Result Value Ref Range Status   Specimen Description URINE, RANDOM  Final   Special Requests ADDED [id number]  Final   Culture MULTIPLE SPECIES PRESENT, SUGGEST RECOLLECTION (A)  Final   Report Status [id number] FINAL  Final    Coagulation Studies:  Recent Labs  11/23/16 1928  LABPROT 13.2  INR 1.00    Imaging: Ct Head Wo Contrast  Result Date: 11/23/2016 CLINICAL DATA:  76 year old female with history of stroke 3 months ago presenting with dizziness and nausea. EXAM: CT HEAD WITHOUT CONTRAST TECHNIQUE: Contiguous axial images were obtained from the base of the skull through the vertex without intravenous contrast. COMPARISON:  Head CT dated 04/09/2016 FINDINGS: Brain: There is moderate age-related atrophy and chronic microvascular ischemic changes. Areas of old infarct and encephalomalacia noted in the right frontal and temporal lobes as well as left posterior parietal convexity similar to prior CT. There is no acute intracranial  hemorrhage. No mass effect or midline shift. No extra-axial fluid collection. Vascular: No hyperdense vessel or unexpected calcification. Skull: Normal. Negative for fracture or focal lesion. Sinuses/Orbits: No acute finding. Other: None IMPRESSION: 1. No acute intracranial hemorrhage. 2. Age-related atrophy and chronic microvascular ischemic changes as well as areas of chronic infarct. Electronically Signed   By: Elgie Collard M.D.   On: 11/23/2016 20:09       Assessment and plan discussed with with attending physician and they are in agreement.    Felicie Morn PA-C [facility] [phone]  11/24/2016, 10:18 AM   Assessment: 76 y.o. female with a transient dizziness/vertigo. Symptoms have fully resolved. CT of head is negative. I've discussed with patient that it is okay for her to have an MRI as we can get the information off of her loop recorder prior to MRI.  Stroke Risk Factors - diabetes mellitus, hyperlipidemia and hypertension  1) dizziness/vertigo: Although at this time symptoms are resolved cannot rule out possible stroke versus TIA although given history of sounds more as though she had BPPV and physical therapy has ordered a vestibular rehabilitation consult. I do believe a MRI would be beneficial at this point in time to look at the intracranial vessels along with evaluate for possible stroke with resolved symptoms. It would also be beneficial to fully work patient up with stroke workup including echo, carotid Dopplers. LDL has been obtained and is significantly elevated at 147 A1c is pending  2) agree with vestibular rehabilitation  3) agree with ongoing physical therapy  Dr. Amada Jupiter to attest this note  I have seen the patient and reviewed the above note. She is continuing to refuse MRI to me. Given that she does have a history of stroke, secondary risk factor reduction would apply whether this was TIA  or not. Agree with statin, antiplatelet therapy,  vestibular rehabilitation. Carotids are negative. Echo to assess for embolic source.   No further recommendations beyond this.  Please call with further questions or concerns.  Ritta Slot, MD [facility] [phone]  If 7pm- 7am, please page neurology on call as listed in AMION.

## 2016-11-24 NOTE — Care Management Note (Signed)
Case Management Note  Patient Details  Name: Sherri [name] MRN: [id number] Date of Birth: 02/17/1941  Subjective/Objective:   Pt in with Vertigo. She is from home with her spouse. She has walker, cane, 3 in 1, shower chair at home. Husband available but not 24/7.                  Action/Plan: Awaiting PT/OT recommendations. CM following for d/c needs, physician orders.   Expected Discharge Date:                  Expected Discharge Plan:     In-House Referral:     Discharge planning Services     Post Acute Care Choice:    Choice offered to:     DME Arranged:    DME Agency:     HH Arranged:    HH Agency:     Status of Service:  In process, will continue to follow  If discussed at Long Length of Stay Meetings, dates discussed:    Additional Comments:  Sherri BaloKelli F Lanesha Azzaro, RN 11/24/2016, 10:47 AM

## 2016-11-24 NOTE — Progress Notes (Signed)
[facility] PROGRESS NOTE  Sherri [name] [id number] DOB: 01/05/1941 DOA: 11/23/2016   PCP: Dianne DunAron, Talia M, MD     Assessment/Plan: Principal Problem:   Vertigo Active Problems:   Hyperlipidemia   Hypertension   Diabetes mellitus type 2 in nonobese (HCC)   CKD (chronic kidney disease)   Hypokalemia   History of CVA with residual deficit   76 y.o. female with medical history significant of HTN, HLD, DM type II, CVA with residual left facial droop; who presents with complaints of dizziness since this morning. Patient reports waking up with a aching headache all over and reports of the room spinning around her. Normally patient gets around with the use of a cane. However, patient significantly off balance and able to ambulate with a normal. Family declined MRI. Neurology requested to consult  Assessment and plan   Vertigo with headache: Acute. Patient presents with acute headache and dizziness. Question for concern of TIA  versus CVA, may need MRI brain per neuro - Telemetry shows normal sinus rhythm - Neuro checks stable -LDL 148  -continue Crestor - PT/OT/ speech  to eval and treat - Check echocardiogram and vascular duplex Doppler ultrasound    - Continue aspirin  325 mg - Interrogate ILR May need repeat imaging study, requested Dr. Petra KubaKilpatrick to evaluate   H/O CVA with residual left-sided weakness - Continue aspirin  Hypokalemia: Acute. Initial potassium 3.2 on admission - Replete potassium and recheck tomorrow  Essential hypertension uncontrolled: Blood pressures initially elevated up to 183/103 on admission. - Continue Norvasc  Diabetes mellitus type 2 - Hypoglycemic protocol - Check hemoglobin A1c  - Continue SSI  Hyperlipidemia - Continue Crestor    Chronic kidney disease stage III: Stable - Continued to monitor    DVT prophylaxsis  lovenox  Code Status:  Full code   Family Communication: Discussed in detail with the patient,  all imaging results, lab results explained to the patient   Disposition Plan:   1-2 days       Consultants:  Neurology  Procedures:  None  Antibiotics: Anti-infectives    None         HPI/Subjective:  still has some residual vertigo,slurred speech   Objective: Vitals:   11/24/16 0000 11/24/16 0200 11/24/16 0410 11/24/16 0620  BP: (!) 170/85 (!) 154/97 (!) 156/63 (!) 145/93  Pulse: 76 69 71 73  Resp: 16 16 16 16   Temp: 97.7 F (36.5 C) 97.7 F (36.5 C) 98.1 F (36.7 C) 97.5 F (36.4 C)  TempSrc: Oral Oral Oral Oral  SpO2: 100% 98% 96% 100%  Weight: 64.6 kg (142 lb 6.4 oz)     Height: 5\' 5"  (1.651 m)       Intake/Output Summary (Last 24 hours) at 11/24/16 0916 Last data filed at 11/24/16 0749  Gross per 24 hour  Intake           496.25 ml  Output             1350 ml  Net          -853.75 ml    Exam:  Examination:  Cardiovascular: Regular rate and rhythm, no murmurs / rubs / gallops. No extremity edema. 2+ pedal pulses. No carotid bruits.  Abdomen: no tenderness, no masses palpated. No hepatosplenomegaly. Bowel sounds positive.  Musculoskeletal: no clubbing / cyanosis. No joint deformity upper and lower extremities. Good ROM, no contractures. Normal muscle tone.  Skin: no rashes, lesions, ulcers. No induration  Neurologic: CN 2-12 grossly intact. Sensation intact, DTR normal. Chronic left-sided weakness with dysarthria    Data Reviewed: I have personally reviewed following labs and imaging studies  Micro Results No results found for this or any previous visit (from the past 240 hour(s)).  Radiology Reports Ct Head Wo Contrast  Result Date: 11/23/2016 CLINICAL DATA:  76 year old female with history of stroke 3 months ago presenting with dizziness and nausea. EXAM: CT HEAD WITHOUT CONTRAST TECHNIQUE: Contiguous axial images were obtained from the base of the skull through the vertex without intravenous contrast. COMPARISON:  Head CT dated 04/09/2016  FINDINGS: Brain: There is moderate age-related atrophy and chronic microvascular ischemic changes. Areas of old infarct and encephalomalacia noted in the right frontal and temporal lobes as well as left posterior parietal convexity similar to prior CT. There is no acute intracranial hemorrhage. No mass effect or midline shift. No extra-axial fluid collection. Vascular: No hyperdense vessel or unexpected calcification. Skull: Normal. Negative for fracture or focal lesion. Sinuses/Orbits: No acute finding. Other: None IMPRESSION: 1. No acute intracranial hemorrhage. 2. Age-related atrophy and chronic microvascular ischemic changes as well as areas of chronic infarct. Electronically Signed   By: Elgie Collard M.D.   On: 11/23/2016 20:09     CBC  Recent Labs Lab 11/23/16 1928 11/23/16 1944 11/24/16 0502  WBC 6.9  --  6.0  HGB 12.4 13.9 12.3  HCT 40.6 41.0 39.8  PLT 207  --  208  MCV 91.0  --  90.2  MCH 27.8  --  27.9  MCHC 30.5  --  30.9  RDW 14.2  --  14.0  LYMPHSABS 2.1  --   --   MONOABS 0.4  --   --   EOSABS 0.2  --   --   BASOSABS 0.0  --   --     Chemistries   Recent Labs Lab 11/23/16 1928 11/23/16 1944  NA 142 143  K 3.2* 3.3*  CL 107 107  CO2 26  --   GLUCOSE 99 102*  BUN 15 18  CREATININE 1.18* 1.10*  CALCIUM 9.4  --   AST 19  --   ALT 16  --   ALKPHOS 81  --   BILITOT 0.3  --    ------------------------------------------------------------------------------------------------------------------ estimated creatinine clearance is 38.5 mL/min (A) (by C-G formula based on SCr of 1.1 mg/dL (H)). ------------------------------------------------------------------------------------------------------------------ No results for input(s): HGBA1C in the last 72 hours. ------------------------------------------------------------------------------------------------------------------  Recent Labs  11/24/16 0502  CHOL 209*  HDL 47  LDLCALC 148*  TRIG 68  CHOLHDL 4.4    ------------------------------------------------------------------------------------------------------------------ No results for input(s): TSH, T4TOTAL, T3FREE, THYROIDAB in the last 72 hours.  Invalid input(s): FREET3 ------------------------------------------------------------------------------------------------------------------ No results for input(s): VITAMINB12, FOLATE, FERRITIN, TIBC, IRON, RETICCTPCT in the last 72 hours.  Coagulation profile  Recent Labs Lab 11/23/16 1928  INR 1.00    No results for input(s): DDIMER in the last 72 hours.  Cardiac Enzymes No results for input(s): CKMB, TROPONINI, MYOGLOBIN in the last 168 hours.  Invalid input(s): CK ------------------------------------------------------------------------------------------------------------------ Invalid input(s): POCBNP   CBG:  Recent Labs Lab 11/23/16 2342 11/23/16 2351 11/24/16 0607  GLUCAP 105* 112* 77       Studies: Ct Head Wo Contrast  Result Date: 11/23/2016 CLINICAL DATA:  76 year old female with history of stroke 3 months ago presenting with dizziness and nausea. EXAM: CT HEAD WITHOUT CONTRAST TECHNIQUE: Contiguous axial images were obtained from the base of the skull through the vertex without intravenous contrast. COMPARISON:  Head CT dated 04/09/2016 FINDINGS: Brain: There is moderate age-related atrophy and chronic microvascular ischemic changes. Areas of old infarct and encephalomalacia noted in the right frontal and temporal lobes as well as left posterior parietal convexity similar to prior CT. There is no acute intracranial hemorrhage. No mass effect or midline shift. No extra-axial fluid collection. Vascular: No hyperdense vessel or unexpected calcification. Skull: Normal. Negative for fracture or focal lesion. Sinuses/Orbits: No acute finding. Other: None IMPRESSION: 1. No acute intracranial hemorrhage. 2. Age-related atrophy and chronic microvascular ischemic changes as well  as areas of chronic infarct. Electronically Signed   By: Elgie Collard M.D.   On: 11/23/2016 20:09      Lab Results  Component Value Date   HGBA1C 5.7 (H) 08/04/2015   HGBA1C 5.8 03/14/2014   HGBA1C 6.1 03/15/2013   Lab Results  Component Value Date   LDLCALC 148 (H) 11/24/2016   CREATININE 1.10 (H) 11/23/2016       Scheduled Meds: . amLODipine  10 mg Oral Daily  . aspirin  325 mg Oral Daily  . enoxaparin (LOVENOX) injection  40 mg Subcutaneous Q24H  . insulin aspart  0-5 Units Subcutaneous QHS  . insulin aspart  0-9 Units Subcutaneous TID WC  . rosuvastatin  5 mg Oral Daily   Continuous Infusions: . sodium chloride 75 mL/hr at 11/24/16 0112     LOS: 0 days    Time spent: >30 MINS    Richarda Overlie  [facility] Pager [phone]. If 7PM-7AM, please contact night-coverage at [email or web address], password [facility] 11/24/2016, 9:16 AM  LOS: 0 days

## 2016-11-24 NOTE — Progress Notes (Signed)
OT Cancellation Note  Patient Details Name: Sherri BirkCreola S Emond MRN: [id number] DOB: 09/25/1940   Cancelled Treatment:    Reason Eval/Treat Not Completed: Patient at procedure or test/ unavailable  [facility] LLC[facility],HILLARY  Ivorie Uplinger, OT/L  [phone] 11/24/2016 11/24/2016, 12:14 PM

## 2016-11-25 ENCOUNTER — Observation Stay (HOSPITAL_BASED_OUTPATIENT_CLINIC_OR_DEPARTMENT_OTHER): Payer: Medicare Other

## 2016-11-25 ENCOUNTER — Other Ambulatory Visit (HOSPITAL_COMMUNITY): Payer: Medicare Other

## 2016-11-25 ENCOUNTER — Observation Stay (HOSPITAL_COMMUNITY): Payer: Medicare Other

## 2016-11-25 DIAGNOSIS — I5030 Unspecified diastolic (congestive) heart failure: Secondary | ICD-10-CM | POA: Diagnosis not present

## 2016-11-25 DIAGNOSIS — N184 Chronic kidney disease, stage 4 (severe): Secondary | ICD-10-CM | POA: Diagnosis not present

## 2016-11-25 DIAGNOSIS — R42 Dizziness and giddiness: Secondary | ICD-10-CM | POA: Diagnosis not present

## 2016-11-25 DIAGNOSIS — I6930 Unspecified sequelae of cerebral infarction: Secondary | ICD-10-CM | POA: Diagnosis not present

## 2016-11-25 DIAGNOSIS — E119 Type 2 diabetes mellitus without complications: Secondary | ICD-10-CM | POA: Diagnosis not present

## 2016-11-25 LAB — CBC
HEMATOCRIT: 40.6 % (ref 36.0–46.0)
Hemoglobin: 12.5 g/dL (ref 12.0–15.0)
MCH: 27.8 pg (ref 26.0–34.0)
MCHC: 30.8 g/dL (ref 30.0–36.0)
MCV: 90.4 fL (ref 78.0–100.0)
Platelets: 210 10*3/uL (ref 150–400)
RBC: 4.49 MIL/uL (ref 3.87–5.11)
RDW: 14.2 % (ref 11.5–15.5)
WBC: 4.2 10*3/uL (ref 4.0–10.5)

## 2016-11-25 LAB — ECHOCARDIOGRAM COMPLETE
AOASC: 30 cm
E decel time: 257 msec
E/e' ratio: 15.93
FS: 21 % — AB (ref 28–44)
HEIGHTINCHES: 65 in
IVS/LV PW RATIO, ED: 1.16
LA diam end sys: 21 mm
LA diam index: 1.23 cm/m2
LA vol A4C: 34.1 ml
LASIZE: 21 mm
LAVOL: 28.1 mL
LAVOLIN: 16.4 mL/m2
LV E/e'average: 15.93
LV SIMPSON'S DISK: 56
LV TDI E'MEDIAL: 4.02
LV dias vol index: 39 mL/m2
LV sys vol index: 17 mL/m2
LV sys vol: 30 mL (ref 14–42)
LVDIAVOL: 67 mL (ref 46–106)
LVEEMED: 15.93
LVELAT: 4.05 cm/s
LVOT SV: 62 mL
LVOT VTI: 21.7 cm
LVOT area: 2.84 cm2
LVOT diameter: 19 mm
LVOT peak vel: 106 cm/s
Lateral S' vel: 9.4 cm/s
MV Dec: 257
MV pk A vel: 94.6 m/s
MVPKEVEL: 64.5 m/s
PW: 8.55 mm — AB (ref 0.6–1.1)
Stroke v: 38 ml
TAPSE: 25.6 mm
TDI e' lateral: 4.05
WEIGHTICAEL: 2278.4 [oz_av]

## 2016-11-25 LAB — COMPREHENSIVE METABOLIC PANEL
ALT: 12 U/L — AB (ref 14–54)
ANION GAP: 6 (ref 5–15)
AST: 14 U/L — ABNORMAL LOW (ref 15–41)
Albumin: 3.2 g/dL — ABNORMAL LOW (ref 3.5–5.0)
Alkaline Phosphatase: 70 U/L (ref 38–126)
BUN: 11 mg/dL (ref 6–20)
CHLORIDE: 111 mmol/L (ref 101–111)
CO2: 23 mmol/L (ref 22–32)
Calcium: 9 mg/dL (ref 8.9–10.3)
Creatinine, Ser: 0.96 mg/dL (ref 0.44–1.00)
GFR calc non Af Amer: 56 mL/min — ABNORMAL LOW (ref >60)
Glucose, Bld: 89 mg/dL (ref 65–99)
POTASSIUM: 4.2 mmol/L (ref 3.5–5.1)
SODIUM: 140 mmol/L (ref 135–145)
Total Bilirubin: 0.9 mg/dL (ref 0.3–1.2)
Total Protein: 6.1 g/dL — ABNORMAL LOW (ref 6.5–8.1)

## 2016-11-25 LAB — GLUCOSE, CAPILLARY
Glucose-Capillary: 93 mg/dL (ref 65–99)
Glucose-Capillary: 81 mg/dL (ref 65–99)
Glucose-Capillary: 108 mg/dL — ABNORMAL HIGH (ref 65–99)

## 2016-11-25 LAB — HEMOGLOBIN A1C
Hgb A1c MFr Bld: 5.5 % (ref 4.8–5.6)
Mean Plasma Glucose: 111 mg/dL

## 2016-11-25 NOTE — Progress Notes (Signed)
Discharge instruction reviewed with patient and brother. All questions answered at this time. Transport home by family.   Sim BoastHavy, RN
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
## 2016-11-25 NOTE — Progress Notes (Signed)
  Echocardiogram 2D Echocardiogram has been performed.  Janalyn HarderWest, Maddyson Keil R 11/25/2016, 2:26 PM

## 2016-11-25 NOTE — Care Management Note (Signed)
Case Management Note  Patient Details  Name: Sherri [name] MRN: [id number] Date of Birth: 04/24/1941  Subjective/Objective:                    Action/Plan: Pt discharging home with orders for [facility] services. CM met with the patient and she asked that her brother be called. CM called and left message. CM did reach out to patients husband and he asked that patients brother be called.  Mr Tamala Julian (pts brother) returned call and CM went over [facility] services and choices. He selected [facility]. Drew with [facility] notified and accepted the referral.  CM left private duty list in patients room per Mr Smith's request.  Mr Tamala Julian to provide transportation home.   Expected Discharge Date:  11/25/16               Expected Discharge Plan:  [place]  In-House Referral:     Discharge planning Services  CM Consult  Post Acute Care Choice:  Home Health Choice offered to:  Patient, Sibling  DME Arranged:    DME Agency:     HH Arranged:  RN, PT, OT, Nurse's Aide, Social Work [employer] Agency:  [place]  Status of Service:  Completed, signed off  If discussed at [employer], dates discussed:    Additional Comments:  Pollie Friar, RN 11/25/2016, 3:19 PM
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
## 2016-11-25 NOTE — Discharge Summary (Addendum)
Physician Discharge Summary  Sherri [name] MRN: [id number] DOB/AGE: 11/14/1940 76 y.o.  PCP: Lucille Passy, MD   Admit date: 11/23/2016 Discharge date: 11/25/2016  Discharge Diagnoses:    Principal Problem:   Vertigo Active Problems:   Hyperlipidemia   Hypertension   Diabetes mellitus type 2 in nonobese (HCC)   CKD (chronic kidney disease)   Hypokalemia   History of CVA with residual deficit    Follow-up recommendations Follow-up with PCP in 3-5 days , including all  additional recommended appointments as below Follow-up CBC, CMP in 3-5 days Patient is being discharged with home health       Current Discharge Medication List    CONTINUE these medications which have NOT CHANGED   Details  aspirin EC 325 MG EC tablet Take 1 tablet (325 mg total) by mouth daily. Qty: 30 tablet, Refills: 0    traMADol (ULTRAM) 50 MG tablet Take 50 mg by mouth every 6 (six) hours as needed (for pain).    amLODipine (NORVASC) 10 MG tablet TAKE 1 TABLET (10 MG TOTAL) BY MOUTH DAILY. Qty: 90 tablet, Refills: 1    gabapentin (NEURONTIN) 100 MG capsule Take 200 mg by mouth at bedtime. Refills: 1    Glucosamine-Chondroit-Vit C-Mn (GLUCOSAMINE 1500 COMPLEX) CAPS Take 1 capsule by mouth daily.     mirtazapine (REMERON) 15 MG tablet Take 1 tablet (15 mg total) by mouth at bedtime. Qty: 90 tablet, Refills: 0    Omega-3 Fatty Acids (FISH OIL) 1000 MG CAPS Take 1 capsule by mouth daily.      rosuvastatin (CRESTOR) 5 MG tablet TAKE 1 TABLET (5 MG TOTAL) BY MOUTH DAILY. Qty: 90 tablet, Refills: 0         Discharge Condition: Stable   Discharge Instructions Get Medicines reviewed and adjusted: Please take all your medications with you for your next visit with your Primary MD  Please request your Primary MD to go over all hospital tests and procedure/radiological results at the follow up, please ask your Primary MD to get all Hospital records sent to his/her office.  If you  experience worsening of your admission symptoms, develop shortness of breath, life threatening emergency, suicidal or homicidal thoughts you must seek medical attention immediately by calling 911 or calling your MD immediately if symptoms less severe.  You must read complete instructions/literature along with all the possible adverse reactions/side effects for all the Medicines you take and that have been prescribed to you. Take any new Medicines after you have completely understood and accpet all the possible adverse reactions/side effects.   Do not drive when taking Pain medications.   Do not take more than prescribed Pain, Sleep and Anxiety Medications  Special Instructions: If you have smoked or chewed Tobacco in the last 2 yrs please stop smoking, stop any regular Alcohol and or any Recreational drug use.  Wear Seat belts while driving.  Please note  You were cared for by a hospitalist during your hospital stay. Once you are discharged, your primary care physician will handle any further medical issues. Please note that NO REFILLS for any discharge medications will be authorized once you are discharged, as it is imperative that you return to your primary care physician (or establish a relationship with a primary care physician if you do not have one) for your aftercare needs so that they can reassess your need for medications and monitor your lab values.  Discharge Instructions    Diet - low sodium heart healthy  Complete by:  As directed    Increase activity slowly    Complete by:  As directed        Allergies  Allergen Reactions  . Hydrogen Peroxide Swelling    Swelling at site applied  . Sulfa Antibiotics     Reaction not recalled by patient      Disposition: 01-Home or Self Care   Consults: neurology    Significant Diagnostic Studies:  Ct Head Wo Contrast  Result Date: 11/23/2016 CLINICAL DATA:  76 year old female with history of stroke 3 months ago  presenting with dizziness and nausea. EXAM: CT HEAD WITHOUT CONTRAST TECHNIQUE: Contiguous axial images were obtained from the base of the skull through the vertex without intravenous contrast. COMPARISON:  Head CT dated 04/09/2016 FINDINGS: Brain: There is moderate age-related atrophy and chronic microvascular ischemic changes. Areas of old infarct and encephalomalacia noted in the right frontal and temporal lobes as well as left posterior parietal convexity similar to prior CT. There is no acute intracranial hemorrhage. No mass effect or midline shift. No extra-axial fluid collection. Vascular: No hyperdense vessel or unexpected calcification. Skull: Normal. Negative for fracture or focal lesion. Sinuses/Orbits: No acute finding. Other: None IMPRESSION: 1. No acute intracranial hemorrhage. 2. Age-related atrophy and chronic microvascular ischemic changes as well as areas of chronic infarct. Electronically Signed   By: Anner Crete M.D.   On: 11/23/2016 20:09    echocardiogram    LV EF: 50% -   55%  ------------------------------------------------------------------- Indications:      TIA 435.9.  ------------------------------------------------------------------- History:   PMH:   Altered mental status.  Stroke.  Risk factors: Hypertension. Diabetes mellitus. Dyslipidemia.  ------------------------------------------------------------------- Study Conclusions  - Left ventricle: The cavity size was normal. Systolic function was   normal. The estimated ejection fraction was in the range of 50%   to 55%. Diffuse hypokinesis. There was an increased relative   contribution of atrial contraction to ventricular filling.   Doppler parameters are consistent with abnormal left ventricular   relaxation (grade 1 diastolic dysfunction). Doppler parameters   are consistent with high ventricular filling pressure. - Aortic valve: Trileaflet; normal thickness, mildly calcified   leaflets. There was  trivial regurgitation. - Mitral valve: There was trivial regurgitation. - Pulmonic valve: There was trivial regurgitation.    Filed Weights   11/23/16 1906 11/23/16 1919 11/24/16 0000  Weight: 61.2 kg (135 lb) 61.2 kg (135 lb) 64.6 kg (142 lb 6.4 oz)     Microbiology: No results found for this or any previous visit (from the past 240 hour(s)).     Blood Culture    Component Value Date/Time   SDES URINE, RANDOM 03/26/2016 0645   SPECREQUEST ADDED [id number] 03/26/2016 0645   CULT MULTIPLE SPECIES PRESENT, SUGGEST RECOLLECTION (A) 03/26/2016 0645   REPTSTATUS Mar 23, 202017 FINAL 03/26/2016 0645      Labs: Results for orders placed or performed during the hospital encounter of 11/23/16 (from the past 48 hour(s))  Protime-INR     Status: None   Collection Time: 11/23/16  7:28 PM  Result Value Ref Range   Prothrombin Time 13.2 11.4 - 15.2 seconds   INR 1.00   APTT     Status: None   Collection Time: 11/23/16  7:28 PM  Result Value Ref Range   aPTT 30 24 - 36 seconds  CBC     Status: None   Collection Time: 11/23/16  7:28 PM  Result Value Ref Range   WBC 6.9 4.0 - 10.5 K/uL  RBC 4.46 3.87 - 5.11 MIL/uL   Hemoglobin 12.4 12.0 - 15.0 g/dL   HCT 40.6 36.0 - 46.0 %   MCV 91.0 78.0 - 100.0 fL   MCH 27.8 26.0 - 34.0 pg   MCHC 30.5 30.0 - 36.0 g/dL   RDW 14.2 11.5 - 15.5 %   Platelets 207 150 - 400 K/uL  Differential     Status: None   Collection Time: 11/23/16  7:28 PM  Result Value Ref Range   Neutrophils Relative % 62 %   Neutro Abs 4.2 1.7 - 7.7 K/uL   Lymphocytes Relative 30 %   Lymphs Abs 2.1 0.7 - 4.0 K/uL   Monocytes Relative 5 %   Monocytes Absolute 0.4 0.1 - 1.0 K/uL   Eosinophils Relative 3 %   Eosinophils Absolute 0.2 0.0 - 0.7 K/uL   Basophils Relative 0 %   Basophils Absolute 0.0 0.0 - 0.1 K/uL  Comprehensive metabolic panel     Status: Abnormal   Collection Time: 11/23/16  7:28 PM  Result Value Ref Range   Sodium 142 135 - 145 mmol/L   Potassium  3.2 (L) 3.5 - 5.1 mmol/L   Chloride 107 101 - 111 mmol/L   CO2 26 22 - 32 mmol/L   Glucose, Bld 99 65 - 99 mg/dL   BUN 15 6 - 20 mg/dL   Creatinine, Ser 1.18 (H) 0.44 - 1.00 mg/dL   Calcium 9.4 8.9 - 10.3 mg/dL   Total Protein 7.2 6.5 - 8.1 g/dL   Albumin 3.9 3.5 - 5.0 g/dL   AST 19 15 - 41 U/L   ALT 16 14 - 54 U/L   Alkaline Phosphatase 81 38 - 126 U/L   Total Bilirubin 0.3 0.3 - 1.2 mg/dL   GFR calc non Af Amer 43 (L) >60 mL/min   GFR calc Af Amer 50 (L) >60 mL/min    Comment: (NOTE) The eGFR has been calculated using the CKD EPI equation. This calculation has not been validated in all clinical situations. eGFR's persistently <60 mL/min signify possible Chronic Kidney Disease.    Anion gap 9 5 - 15  I-stat troponin, ED     Status: None   Collection Time: 11/23/16  7:42 PM  Result Value Ref Range   Troponin i, poc 0.01 0.00 - 0.08 ng/mL   Comment 3            Comment: Due to the release kinetics of cTnI, a negative result within the first hours of the onset of symptoms does not rule out myocardial infarction with certainty. If myocardial infarction is still suspected, repeat the test at appropriate intervals.   I-Stat Chem 8, ED     Status: Abnormal   Collection Time: 11/23/16  7:44 PM  Result Value Ref Range   Sodium 143 135 - 145 mmol/L   Potassium 3.3 (L) 3.5 - 5.1 mmol/L   Chloride 107 101 - 111 mmol/L   BUN 18 6 - 20 mg/dL   Creatinine, Ser 1.10 (H) 0.44 - 1.00 mg/dL   Glucose, Bld 102 (H) 65 - 99 mg/dL   Calcium, Ion 1.12 (L) 1.15 - 1.40 mmol/L   TCO2 23 0 - 100 mmol/L   Hemoglobin 13.9 12.0 - 15.0 g/dL   HCT 41.0 36.0 - 46.0 %  Glucose, capillary     Status: Abnormal   Collection Time: 11/23/16 11:42 PM  Result Value Ref Range   Glucose-Capillary 105 (H) 65 - 99 mg/dL  Glucose, capillary  Status: Abnormal   Collection Time: 11/23/16 11:51 PM  Result Value Ref Range   Glucose-Capillary 112 (H) 65 - 99 mg/dL  Hemoglobin A1c     Status: None    Collection Time: 11/24/16  5:02 AM  Result Value Ref Range   Hgb A1c MFr Bld 5.5 4.8 - 5.6 %    Comment: (NOTE)         Pre-diabetes: 5.7 - 6.4         Diabetes: >6.4         Glycemic control for adults with diabetes: <7.0    Mean Plasma Glucose 111 mg/dL    Comment: (NOTE) Performed At: [facility] [place], [place] [id number] Lindon Romp MD [id number]   Lipid panel     Status: Abnormal   Collection Time: 11/24/16  5:02 AM  Result Value Ref Range   Cholesterol 209 (H) 0 - 200 mg/dL   Triglycerides 68 <150 mg/dL   HDL 47 >40 mg/dL   Total CHOL/HDL Ratio 4.4 RATIO   VLDL 14 0 - 40 mg/dL   LDL Cholesterol 148 (H) 0 - 99 mg/dL    Comment:        Total Cholesterol/HDL:CHD Risk Coronary Heart Disease Risk Table                     Men   Women  1/2 Average Risk   3.4   3.3  Average Risk       5.0   4.4  2 X Average Risk   9.6   7.1  3 X Average Risk  23.4   11.0        Use the calculated Patient Ratio above and the CHD Risk Table to determine the patient's CHD Risk.        ATP III CLASSIFICATION (LDL):  <100     mg/dL   Optimal  100-129  mg/dL   Near or Above                    Optimal  130-159  mg/dL   Borderline  160-189  mg/dL   High  >190     mg/dL   Very High   CBC     Status: None   Collection Time: 11/24/16  5:02 AM  Result Value Ref Range   WBC 6.0 4.0 - 10.5 K/uL   RBC 4.41 3.87 - 5.11 MIL/uL   Hemoglobin 12.3 12.0 - 15.0 g/dL   HCT 39.8 36.0 - 46.0 %   MCV 90.2 78.0 - 100.0 fL   MCH 27.9 26.0 - 34.0 pg   MCHC 30.9 30.0 - 36.0 g/dL   RDW 14.0 11.5 - 15.5 %   Platelets 208 150 - 400 K/uL  Glucose, capillary     Status: None   Collection Time: 11/24/16  6:07 AM  Result Value Ref Range   Glucose-Capillary 77 65 - 99 mg/dL   Comment 1 Notify RN    Comment 2 Document in Chart   Glucose, capillary     Status: None   Collection Time: 11/24/16 11:59 AM  Result Value Ref Range   Glucose-Capillary 92 65 - 99 mg/dL  Glucose,  capillary     Status: Abnormal   Collection Time: 11/24/16  6:10 PM  Result Value Ref Range   Glucose-Capillary 103 (H) 65 - 99 mg/dL  Glucose, capillary     Status: None   Collection Time: 11/24/16  9:31 PM  Result Value Ref Range   Glucose-Capillary 88 65 - 99 mg/dL   Comment 1 Notify RN    Comment 2 Document in Chart   Comprehensive metabolic panel     Status: Abnormal   Collection Time: 11/25/16  5:49 AM  Result Value Ref Range   Sodium 140 135 - 145 mmol/L   Potassium 4.2 3.5 - 5.1 mmol/L   Chloride 111 101 - 111 mmol/L   CO2 23 22 - 32 mmol/L   Glucose, Bld 89 65 - 99 mg/dL   BUN 11 6 - 20 mg/dL   Creatinine, Ser 0.96 0.44 - 1.00 mg/dL   Calcium 9.0 8.9 - 10.3 mg/dL   Total Protein 6.1 (L) 6.5 - 8.1 g/dL   Albumin 3.2 (L) 3.5 - 5.0 g/dL   AST 14 (L) 15 - 41 U/L   ALT 12 (L) 14 - 54 U/L   Alkaline Phosphatase 70 38 - 126 U/L   Total Bilirubin 0.9 0.3 - 1.2 mg/dL   GFR calc non Af Amer 56 (L) >60 mL/min   GFR calc Af Amer >60 >60 mL/min    Comment: (NOTE) The eGFR has been calculated using the CKD EPI equation. This calculation has not been validated in all clinical situations. eGFR's persistently <60 mL/min signify possible Chronic Kidney Disease.    Anion gap 6 5 - 15  CBC     Status: None   Collection Time: 11/25/16  5:49 AM  Result Value Ref Range   WBC 4.2 4.0 - 10.5 K/uL   RBC 4.49 3.87 - 5.11 MIL/uL   Hemoglobin 12.5 12.0 - 15.0 g/dL   HCT 40.6 36.0 - 46.0 %   MCV 90.4 78.0 - 100.0 fL   MCH 27.8 26.0 - 34.0 pg   MCHC 30.8 30.0 - 36.0 g/dL   RDW 14.2 11.5 - 15.5 %   Platelets 210 150 - 400 K/uL  Glucose, capillary     Status: None   Collection Time: 11/25/16  6:01 AM  Result Value Ref Range   Glucose-Capillary 93 65 - 99 mg/dL   Comment 1 Notify RN    Comment 2 Document in Chart      Lipid Panel     Component Value Date/Time   CHOL 209 (H) 11/24/2016 0502   TRIG 68 11/24/2016 0502   HDL 47 11/24/2016 0502   CHOLHDL 4.4 11/24/2016 0502   VLDL  14 11/24/2016 0502   LDLCALC 148 (H) 11/24/2016 0502     Lab Results  Component Value Date   HGBA1C 5.5 11/24/2016   HGBA1C 5.7 (H) 08/04/2015   HGBA1C 5.8 03/14/2014     HPI :   76 y.o. female with medical history significant of HTN, HLD, DM type II, CVA with residual left facial droop; who presents with complaints of dizziness since this morning. Patient reports waking up with a aching headache all over and reports of the room spinning around her. Normally patient gets around with the use of a cane. However, patient significantly off balance and able to ambulate with a normal. Patient previously had been living up in the [place] area with her brother, but had come back down to [place] to be close to family around Mother's Day. Since that time the brother makes note that the patient has not been taking any of her medications as family members are not actively participating in her care. Today while visiting her at her house from [place] he found her in a chair and  noticed that she was unable to get up and her speech was more slurred than normal. He reports that no one has been making sure that the patient is going to her doctor's appointments or taking her medicines.  at the house he noted that some of her medications were in the wrong time slot.  He feels that she needs to have some kind of home health aide to come and check to make sure that her medicines are being given. Patient lies had a stroke in February 2018 in [place] where patient has had a residual left sided weakness and some slurred speech at baseline.  ED Course: En route  with EMS the patient was given aspirin. Patient was initially called a code stroke but this was canceled after being evaluated by the ED physician.   initial vital signs are relatively unremarkable except for blood pressures elevated at 186/103. Labs revealed potassium of 3.2, creatinine 1.18. Initial CT scan of the brain was negative for any signs of stroke. The ED  physician ordered for MRI to be obtained, but family declined this as patient has been told previously in the past that she cannot have MRIs due to this loop recorder. Discussed with family that appears to loop recorder may be compatible with our MRI, but they still declined as they know she's had strokes before. Patient now reports resolution of headache and dizziness symptoms.  HOSPITAL COURSE:   Vertigowith headache:  Acute. Patient presents with acute headache and dizziness. Question for concern of TIA versus CVA, may need MRI brain per neuro - Telemetry shows normal sinus rhythm - Neuro checks stable, dizziness resolved  -LDL 148  -continue Crestor - PT/OT/-arranged for [facility]   echocardiogram ..EF: 50% -   55% ,. .and vascular duplex Doppler ultrasound  Bilateral:  1-39% ICA stenosis - Continue aspirin  325 mg - Interrogated ILR-no alarms  requested Dr. Katherine Roan with neurology to evaluate,continuing to refuse MRI, cannot completely rule out TIA. Continue with statin, antiplatelet therapy, vestibular rehabilitation, no further recommendations from a cardiology standpoint   H/O CVA with residual left-sided weakness - Continue aspirin  Hypokalemia: Acute. Initial potassium 3.2 on admission - Potassium repleted, now 4.2  Essential hypertension uncontrolled:Blood pressures initially elevated up to 183/103 on admission. - Continue Norvasc  Diabetes mellitus type 2 - Hypoglycemic protocol -Hemoglobin A1c 5.5  - Continue SSI  Hyperlipidemia - Continue Crestor    Chronic kidney disease stage III: Stable - Continued to monitor      Discharge Exam:    Blood pressure (!) 147/76, pulse 79, temperature 98.2 F (36.8 C), temperature source Oral, resp. rate 20, height _0  (1.651 m), weight 64.6 kg (142 lb 6.4 oz), SpO2 97 %.  Cardiovascular: Regular rate and rhythm, no murmurs / rubs / gallops. No extremity edema. 2+ pedal pulses. No carotid bruits.  Abdomen: no  tenderness, no masses palpated. No hepatosplenomegaly. Bowel sounds positive.  Musculoskeletal: no clubbing / cyanosis. No joint deformity upper and lower extremities. Good ROM, no contractures. Normal muscle tone.  Skin: no rashes, lesions, ulcers. No induration Neurologic: CN 2-12 grossly intact. Sensation intact, DTR normal. Chronic left-sided weakness with dysarthria Psychiatric: Normal judgment and insight. Alert and oriented x 3. Normal mood.       SignedReyne Dumas 11/25/2016, 11:09 AM        Time spent >1 hour

## 2016-11-29 ENCOUNTER — Encounter: Payer: Self-pay | Admitting: Family Medicine

## 2016-11-29 ENCOUNTER — Ambulatory Visit (INDEPENDENT_AMBULATORY_CARE_PROVIDER_SITE_OTHER): Payer: Medicare Other | Admitting: Family Medicine

## 2016-11-29 VITALS — BP 128/72 | HR 82 | Temp 98.8°F | Wt 142.8 lb

## 2016-11-29 DIAGNOSIS — I6930 Unspecified sequelae of cerebral infarction: Secondary | ICD-10-CM

## 2016-11-29 DIAGNOSIS — E876 Hypokalemia: Secondary | ICD-10-CM | POA: Diagnosis not present

## 2016-11-29 DIAGNOSIS — R42 Dizziness and giddiness: Secondary | ICD-10-CM | POA: Diagnosis not present

## 2016-11-29 LAB — BASIC METABOLIC PANEL
BUN: 17 mg/dL (ref 6–23)
CHLORIDE: 110 meq/L (ref 96–112)
CO2: 28 meq/L (ref 19–32)
CREATININE: 1.32 mg/dL — AB (ref 0.40–1.20)
Calcium: 9.5 mg/dL (ref 8.4–10.5)
GFR: 50.15 mL/min — ABNORMAL LOW (ref >60.00)
Glucose, Bld: 103 mg/dL — ABNORMAL HIGH (ref 70–99)
Potassium: 4 mEq/L (ref 3.5–5.1)
Sodium: 144 mEq/L (ref 135–145)

## 2016-11-29 NOTE — Assessment & Plan Note (Signed)
HH PT ordered in the hospital. Spoke with her brother who will make sure she allows them in her home.  This has been an issue in the past.

## 2016-11-29 NOTE — Assessment & Plan Note (Signed)
Resolved

## 2016-11-29 NOTE — Progress Notes (Signed)
Subjective:   Patient ID: Sherri [name], female    DOB: 26-Mar-1941, 76 y.o.   MRN: [id number]  Sherri [name] is a pleasant 76 y.o. year old female who presents to clinic today with her brother for Hospitalization Follow-up  on 11/29/2016  HPI:  Admitted 6/19/- 11/25/16 for dizziness.  Notes reviewed.  Brother stated that when he went to patient's house the day of admission, he found her ina chair and noticed that she wasn't moving normally and her speech was slurred.  Pt reported waking up with a headache and feeling like room was spinning.  Also felt off balance when she walked. Ct Head Wo Contrast  Result Date: 11/23/2016 CLINICAL DATA:  76 year old female with history of stroke 3 months ago presenting with dizziness and nausea. EXAM: CT HEAD WITHOUT CONTRAST TECHNIQUE: Contiguous axial images were obtained from the base of the skull through the vertex without intravenous contrast. COMPARISON:  Head CT dated 04/09/2016 FINDINGS: Brain: There is moderate age-related atrophy and chronic microvascular ischemic changes. Areas of old infarct and encephalomalacia noted in the right frontal and temporal lobes as well as left posterior parietal convexity similar to prior CT. There is no acute intracranial hemorrhage. No mass effect or midline shift. No extra-axial fluid collection. Vascular: No hyperdense vessel or unexpected calcification. Skull: Normal. Negative for fracture or focal lesion. Sinuses/Orbits: No acute finding. Other: None IMPRESSION: 1. No acute intracranial hemorrhage. 2. Age-related atrophy and chronic microvascular ischemic changes as well as areas of chronic infarct. Electronically Signed   By: Elgie Collard M.D.   On: 11/23/2016 20:09   Head CT was negative for acute findings.   Question for concern of TIAversus CVA, may need MRI brain per neuro but this was refused by pt and her family.  Neurology and cardiology consulted- recommended Continue with statin, antiplatelet  therapy, vestibular rehabilitation, no further recommendations from a cardiology standpoint   Neuro checks stable, dizziness resolved - ?vertigo LDL 148 -continue Crestor PT/OT/-arranged for [facility]  echocardiogram ..EF: 50% - 55% ,. .and vascular duplex Doppler ultrasound Bilateral: 1-39% ICA stenosis   Interrogated ILR-no alarms   Hypokalemia: Acute. Initial potassium 3.2 on admission  4.2 at discharge.  Lab Results  Component Value Date   NA 140 11/25/2016   K 4.2 11/25/2016   CL 111 11/25/2016   CO2 23 11/25/2016     Current Outpatient Prescriptions on File Prior to Visit  Medication Sig Dispense Refill  . amLODipine (NORVASC) 10 MG tablet TAKE 1 TABLET (10 MG TOTAL) BY MOUTH DAILY. 90 tablet 1  . aspirin EC 325 MG EC tablet Take 1 tablet (325 mg total) by mouth daily. 30 tablet 0  . gabapentin (NEURONTIN) 100 MG capsule Take 200 mg by mouth at bedtime.  1  . Glucosamine-Chondroit-Vit C-Mn (GLUCOSAMINE 1500 COMPLEX) CAPS Take 1 capsule by mouth daily.     . mirtazapine (REMERON) 15 MG tablet Take 1 tablet (15 mg total) by mouth at bedtime. 90 tablet 0  . Omega-3 Fatty Acids (FISH OIL) 1000 MG CAPS Take 1 capsule by mouth daily.      . rosuvastatin (CRESTOR) 5 MG tablet TAKE 1 TABLET (5 MG TOTAL) BY MOUTH DAILY. 90 tablet 0  . traMADol (ULTRAM) 50 MG tablet Take 50 mg by mouth every 6 (six) hours as needed (for pain).     No current facility-administered medications on file prior to visit.     Allergies  Allergen Reactions  . Hydrogen Peroxide Swelling  Swelling at site applied  . Sulfa Antibiotics     Reaction not recalled by patient    Past Medical History:  Diagnosis Date  . Diabetes mellitus without complication (HCC)   . Hyperlipidemia   . Hypertension   . Stroke [facility](HCC)     Past Surgical History:  Procedure Laterality Date  . EP IMPLANTABLE DEVICE N/A 08/06/2015   Procedure: Loop Recorder Insertion;  Surgeon: Hillis RangeJames Allred, MD;  [facility]  [facility];  Service: Cardiovascular;  Laterality: N/A;  . TEE WITHOUT CARDIOVERSION N/A 08/06/2015   Procedure: TRANSESOPHAGEAL ECHOCARDIOGRAM (TEE);  Surgeon: Lars MassonKatarina H Nelson, MD;  [facility];  Service: Cardiovascular;  Laterality: N/A;    Family History  Problem Relation Age of Onset  . Hypertension Mother   . Hypertension Father     Social History   Social History  . Marital status: Married    Spouse name: N/A  . Number of children: N/A  . Years of education: N/A   Occupational History  . Not on file.   Social History Main Topics  . Smoking status: Never Smoker  . Smokeless tobacco: Never Used  . Alcohol use No  . Drug use: No  . Sexual activity: No   Other Topics Concern  . Not on file   Social History Narrative   Desires CPR.   Would not want prolonged life support if futile   The PMH, PSH, Social History, Family History, Medications, and allergies [facility], and have been updated if relevant.    Review of Systems  Constitutional: Negative.   HENT: Negative.   Eyes: Negative.   Respiratory: Negative.   Cardiovascular: Negative.   Gastrointestinal: Negative.   Endocrine: Negative.   Genitourinary: Negative.   Musculoskeletal: Negative.   Skin: Negative.   Allergic/Immunologic: Negative.   Neurological: Negative.   Hematological: Negative.   Psychiatric/Behavioral: Negative.   All other systems reviewed and are negative.      Objective:    BP 128/72 (BP Location: Right Arm, Patient Position: Sitting, Cuff Size: Normal)   Pulse 82   Temp 98.8 F (37.1 C) (Oral)   Wt 142 lb 12 oz (64.8 kg)   SpO2 96%   BMI 23.75 kg/m   Wt Readings from Last 3 Encounters:  11/29/16 142 lb 12 oz (64.8 kg)  11/24/16 142 lb 6.4 oz (64.6 kg)  06/16/16 137 lb 8 oz (62.4 kg)     Physical Exam  Constitutional: She is oriented to person, place, and time. She appears well-developed and well-nourished. No distress.  HENT:  Head:  Normocephalic and atraumatic.  Eyes: Conjunctivae are normal.  Cardiovascular: Normal rate.   Pulmonary/Chest: Effort normal.  Neurological: She is alert and oriented to person, place, and time.  Skin: Skin is warm and dry. She is not diaphoretic.  Psychiatric: She has a normal mood and affect. Her behavior is normal. Judgment and thought content normal.  Nursing note and vitals reviewed.         Assessment & Plan:   Vertigo  Hypokalemia - Plan: Basic metabolic panel  History of CVA with residual deficit No Follow-up on file.

## 2016-11-29 NOTE — Assessment & Plan Note (Signed)
Check potassium today

## 2016-11-30 ENCOUNTER — Telehealth: Payer: Self-pay

## 2016-11-30 ENCOUNTER — Encounter: Payer: Self-pay | Admitting: *Deleted

## 2016-11-30 DIAGNOSIS — I129 Hypertensive chronic kidney disease with stage 1 through stage 4 chronic kidney disease, or unspecified chronic kidney disease: Secondary | ICD-10-CM | POA: Diagnosis not present

## 2016-11-30 DIAGNOSIS — R42 Dizziness and giddiness: Secondary | ICD-10-CM | POA: Diagnosis not present

## 2016-11-30 DIAGNOSIS — I69328 Other speech and language deficits following cerebral infarction: Secondary | ICD-10-CM | POA: Diagnosis not present

## 2016-11-30 DIAGNOSIS — E1122 Type 2 diabetes mellitus with diabetic chronic kidney disease: Secondary | ICD-10-CM | POA: Diagnosis not present

## 2016-11-30 DIAGNOSIS — N183 Chronic kidney disease, stage 3 (moderate): Secondary | ICD-10-CM | POA: Diagnosis not present

## 2016-11-30 DIAGNOSIS — I69392 Facial weakness following cerebral infarction: Secondary | ICD-10-CM | POA: Diagnosis not present

## 2016-11-30 DIAGNOSIS — I69354 Hemiplegia and hemiparesis following cerebral infarction affecting left non-dominant side: Secondary | ICD-10-CM | POA: Diagnosis not present

## 2016-11-30 NOTE — Telephone Encounter (Signed)
Ann[facility]Sherry [facility]m [facility] 3 x a week for 2 weeks and 2 x a week for 3 weeks to monitor BP and pts safety.

## 2016-11-30 NOTE — Telephone Encounter (Signed)
Okay to give verbal orders as requested. 

## 2016-11-30 NOTE — Telephone Encounter (Signed)
Verbal order given  

## 2016-12-02 ENCOUNTER — Telehealth: Payer: Self-pay

## 2016-12-02 NOTE — Telephone Encounter (Signed)
[name]; pt was to be seen today for PT eval and pt requested for Sherri to come back on 12/03/16. FYI to Dr [name].

## 2016-12-03 ENCOUNTER — Telehealth: Payer: Self-pay

## 2016-12-03 NOTE — Telephone Encounter (Signed)
Morrie Sheldonshley [facility] called to let you know there is a delay with PT but she will out Monday, 12/06/16.

## 2016-12-03 NOTE — Telephone Encounter (Signed)
Noted! Thank you

## 2016-12-03 NOTE — Telephone Encounter (Signed)
Ok to give verbal orders as requested. 

## 2016-12-03 NOTE — Telephone Encounter (Signed)
Social worker [facility] left [facility] requesting verbal orders to return to see pt one time a week for one week to assist with education on community resources.

## 2016-12-06 NOTE — Telephone Encounter (Signed)
Left message to return call 

## 2016-12-06 NOTE — Telephone Encounter (Signed)
Adela LankJacqueline was notified.

## 2016-12-06 NOTE — Telephone Encounter (Signed)
Adela LankJacqueline request cb about HH Child psychotherapistsocial worker order.

## 2016-12-09 ENCOUNTER — Telehealth: Payer: Self-pay

## 2016-12-09 NOTE — Telephone Encounter (Signed)
[facility]; [facility]. There will not be any further visits; pts home situation seems somewhat complicated; no consistent care giver to do teaching with and due to pts mental status Amy does not think there would be any carry over for safety otherwise.

## 2016-12-14 ENCOUNTER — Emergency Department (HOSPITAL_COMMUNITY)
Admission: EM | Admit: 2016-12-14 | Discharge: 2016-12-15 | Disposition: A | Discharge status: Home / Self Care | Payer: Medicare Other | Attending: Emergency Medicine | Admitting: Emergency Medicine

## 2016-12-14 ENCOUNTER — Encounter (HOSPITAL_COMMUNITY): Payer: Self-pay | Admitting: Emergency Medicine

## 2016-12-14 DIAGNOSIS — F0391 Unspecified dementia with behavioral disturbance: Secondary | ICD-10-CM | POA: Diagnosis not present

## 2016-12-14 DIAGNOSIS — N189 Chronic kidney disease, unspecified: Secondary | ICD-10-CM | POA: Insufficient documentation

## 2016-12-14 DIAGNOSIS — I129 Hypertensive chronic kidney disease with stage 1 through stage 4 chronic kidney disease, or unspecified chronic kidney disease: Secondary | ICD-10-CM | POA: Insufficient documentation

## 2016-12-14 DIAGNOSIS — E119 Type 2 diabetes mellitus without complications: Secondary | ICD-10-CM | POA: Diagnosis not present

## 2016-12-14 DIAGNOSIS — Z7982 Long term (current) use of aspirin: Secondary | ICD-10-CM | POA: Diagnosis not present

## 2016-12-14 DIAGNOSIS — F4323 Adjustment disorder with mixed anxiety and depressed mood: Secondary | ICD-10-CM | POA: Diagnosis present

## 2016-12-14 DIAGNOSIS — F03918 Unspecified dementia, unspecified severity, with other behavioral disturbance: Secondary | ICD-10-CM

## 2016-12-14 DIAGNOSIS — R413 Other amnesia: Secondary | ICD-10-CM | POA: Diagnosis not present

## 2016-12-14 DIAGNOSIS — R45851 Suicidal ideations: Secondary | ICD-10-CM | POA: Diagnosis not present

## 2016-12-14 DIAGNOSIS — Z79899 Other long term (current) drug therapy: Secondary | ICD-10-CM | POA: Insufficient documentation

## 2016-12-14 LAB — COMPREHENSIVE METABOLIC PANEL
ALK PHOS: 84 U/L (ref 38–126)
ALT: 16 U/L (ref 14–54)
ANION GAP: 11 (ref 5–15)
AST: 19 U/L (ref 15–41)
Albumin: 4 g/dL (ref 3.5–5.0)
BUN: 14 mg/dL (ref 6–20)
CALCIUM: 9.7 mg/dL (ref 8.9–10.3)
CHLORIDE: 108 mmol/L (ref 101–111)
CO2: 25 mmol/L (ref 22–32)
Creatinine, Ser: 1.06 mg/dL — ABNORMAL HIGH (ref 0.44–1.00)
GFR calc non Af Amer: 49 mL/min — ABNORMAL LOW (ref >60)
GFR, EST AFRICAN AMERICAN: 57 mL/min — AB (ref >60)
Glucose, Bld: 100 mg/dL — ABNORMAL HIGH (ref 65–99)
Potassium: 3.4 mmol/L — ABNORMAL LOW (ref 3.5–5.1)
SODIUM: 144 mmol/L (ref 135–145)
Total Bilirubin: 0.6 mg/dL (ref 0.3–1.2)
Total Protein: 7.2 g/dL (ref 6.5–8.1)

## 2016-12-14 LAB — CBG MONITORING, ED: Glucose-Capillary: 93 mg/dL (ref 65–99)

## 2016-12-14 LAB — RAPID URINE DRUG SCREEN, HOSP PERFORMED
Amphetamines: NOT DETECTED
Barbiturates: NOT DETECTED
Benzodiazepines: NOT DETECTED
Cocaine: NOT DETECTED
Opiates: NOT DETECTED
Tetrahydrocannabinol: NOT DETECTED

## 2016-12-14 LAB — URINALYSIS, ROUTINE W REFLEX MICROSCOPIC
Bilirubin Urine: NEGATIVE
Glucose, UA: NEGATIVE mg/dL
Hgb urine dipstick: NEGATIVE
Ketones, ur: NEGATIVE mg/dL
Nitrite: NEGATIVE
Protein, ur: 30 mg/dL — AB
Specific Gravity, Urine: 1.011 (ref 1.005–1.030)
pH: 6 (ref 5.0–8.0)

## 2016-12-14 LAB — CBC
HCT: 38.2 % (ref 36.0–46.0)
Hemoglobin: 12.4 g/dL (ref 12.0–15.0)
MCH: 28.6 pg (ref 26.0–34.0)
MCHC: 32.5 g/dL (ref 30.0–36.0)
MCV: 88 fL (ref 78.0–100.0)
Platelets: 230 K/uL (ref 150–400)
RBC: 4.34 MIL/uL (ref 3.87–5.11)
RDW: 14.5 % (ref 11.5–15.5)
WBC: 6.2 K/uL (ref 4.0–10.5)

## 2016-12-14 LAB — ETHANOL

## 2016-12-14 LAB — SALICYLATE LEVEL: Salicylate Lvl: <7 mg/dL (ref 2.8–30.0)

## 2016-12-14 LAB — ACETAMINOPHEN LEVEL: Acetaminophen (Tylenol), Serum: <10 ug/mL — ABNORMAL LOW (ref 10–30)

## 2016-12-14 MED ORDER — GABAPENTIN 100 MG PO CAPS
200.0000 mg | ORAL_CAPSULE | Freq: Every day | ORAL | Status: DC
  Administered 2016-12-14: 200 mg via ORAL
  Filled 2016-12-14: qty 2

## 2016-12-14 MED ORDER — MIRTAZAPINE 30 MG PO TABS: 15.0000 mg | ORAL_TABLET | Freq: Every day | ORAL | Status: DC

## 2016-12-14 MED ORDER — AMLODIPINE BESYLATE 5 MG PO TABS
10.0000 mg | ORAL_TABLET | Freq: Every day | ORAL | Status: DC
  Administered 2016-12-14 – 2016-12-15 (×2): 10 mg via ORAL
  Filled 2016-12-14 (×2): qty 2

## 2016-12-14 MED ORDER — CITALOPRAM HYDROBROMIDE 10 MG PO TABS
10.0000 mg | ORAL_TABLET | Freq: Every day | ORAL | Status: DC
  Administered 2016-12-14 – 2016-12-15 (×2): 10 mg via ORAL
  Filled 2016-12-14 (×2): qty 1

## 2016-12-14 MED ORDER — QUETIAPINE FUMARATE 25 MG PO TABS
25.0000 mg | ORAL_TABLET | Freq: Every day | ORAL | Status: DC
  Administered 2016-12-14: 25 mg via ORAL
  Filled 2016-12-14: qty 1

## 2016-12-14 MED ORDER — ROSUVASTATIN CALCIUM 5 MG PO TABS
5.0000 mg | ORAL_TABLET | Freq: Every day | ORAL | Status: DC
  Administered 2016-12-14: 5 mg via ORAL
  Filled 2016-12-14: qty 1

## 2016-12-14 NOTE — BH Assessment (Signed)
Assessment Note   Sherri [name] is an 76 y.o. female with dementia who came to the ED under IVC by her husband due to making suicidal statements and "attacking him". Pt has a diagnosis of dementia and is confused, paranoid and not oriented. Pt does not remember her birthday when asked. Pt states that she doesn't know why she is here but she does remember "scratching her husband". She states that she thinks her husband is "running around on her" and wants to "get her out of the house so he can bring his women in". Pt also has a history of a stroke and states her memory has been worse since this has happened. Pt denies history of psychiatric issues and none are listed in chart. Pt denies AVH, SI or HI. She has a daughter and brother who are supportive.   Disposition: Observe overnight and reevaluate in the AM per Dr. Jannifer FranklinAkintayo   Diagnosis: Dementia with behavioral disturbance   Past Medical History:  Past Medical History:  Diagnosis Date  . Diabetes mellitus without complication (HCC)   . Hyperlipidemia   . Hypertension   . Stroke [facility](HCC)     Past Surgical History:  Procedure Laterality Date  . EP IMPLANTABLE DEVICE N/A 08/06/2015   Procedure: Loop Recorder Insertion;  Surgeon: Hillis RangeJames Allred, MD;  [facility] INVASIVE CV LAB;  Service: Cardiovascular;  Laterality: N/A;  . TEE WITHOUT CARDIOVERSION N/A 08/06/2015   Procedure: TRANSESOPHAGEAL ECHOCARDIOGRAM (TEE);  Surgeon: Lars MassonKatarina H Nelson, MD;  [facility];  Service: Cardiovascular;  Laterality: N/A;    Family History:  Family History  Problem Relation Age of Onset  . Hypertension Mother   . Hypertension Father     Social History:  reports that she has never smoked. She has never used smokeless tobacco. She reports that she does not drink alcohol or use drugs.  Additional Social History:  Alcohol / Drug Use Pain Medications: see PTA Prescriptions: see PTA Over the Counter: see PTA History of alcohol / drug use?: No  history of alcohol / drug abuse  CIWA: CIWA-Ar BP: 138/87 Pulse Rate: 87 COWS:    PATIENT STRENGTHS: (choose at least two) Average or above average intelligence Supportive family/friends  Allergies:  Allergies  Allergen Reactions  . Hydrogen Peroxide Swelling and Other (See Comments)    Reaction:  Localized swelling  . Sulfa Antibiotics Other (See Comments)    Reaction:  Unknown     Home Medications:  (Not in a hospital admission)  OB/GYN Status:  No LMP recorded. Patient is postmenopausal.  General Assessment Data Location of Assessment: [facility] TTS Assessment: In system Is this a Tele or Face-to-Face Assessment?: Face-to-Face Is this an Initial Assessment or a Re-assessment for this encounter?: Initial Assessment Marital status: Married Is patient pregnant?: No Pregnancy Status: No Living Arrangements: Spouse/significant other Can pt return to current living arrangement?: Yes Admission Status: Involuntary Is patient capable of signing voluntary admission?: No Referral Source: Self/Family/Friend Insurance type:  [facility]([employer] )     Crisis Care Plan Living Arrangements: Spouse/significant other Name of Psychiatrist: None Name of Therapist: None  Education Status Is patient currently in school?: No  Risk to self with the past 6 months Suicidal Ideation: No Has patient been a risk to self within the past 6 months prior to admission? : No Suicidal Intent: No Has patient had any suicidal intent within the past 6 months prior to admission? : No Is patient at risk for suicide?: No Suicidal Plan?: No Has patient  had any suicidal plan within the past 6 months prior to admission? : No Access to Means: No What has been your use of drugs/alcohol within the last 12 months?: no Previous Attempts/Gestures: No How many times?: 0 Other Self Harm Risks: 0 Triggers for Past Attempts: Unknown Intentional Self Injurious Behavior: None Family Suicide History: No Recent stressful  life event(s): Other (Comment) Persecutory voices/beliefs?: No Depression: Yes Depression Symptoms: Despondent, Feeling angry/irritable Substance abuse history and/or treatment for substance abuse?: No Suicide prevention information given to non-admitted patients: Not applicable  Risk to Others within the past 6 months Homicidal Ideation: No Does patient have any lifetime risk of violence toward others beyond the six months prior to admission? : No Thoughts of Harm to Others: No Current Homicidal Intent: No Current Homicidal Plan: No Access to Homicidal Means: No Identified Victim: none History of harm to others?: No Assessment of Violence: None Noted Violent Behavior Description: "scratched husband" Does patient have access to weapons?: No Criminal Charges Pending?: No Does patient have a court date: No Is patient on probation?: No  Psychosis Hallucinations: None noted Delusions: None noted  Mental Status Report Appearance/Hygiene: Unremarkable Eye Contact: Good Motor Activity: Freedom of movement Speech: Logical/coherent Level of Consciousness: Alert Mood: Depressed Affect: Appropriate to circumstance Anxiety Level: None Thought Processes: Coherent Judgement: Impaired Orientation: Not oriented Obsessive Compulsive Thoughts/Behaviors: None  Cognitive Functioning Concentration: Normal Memory: Recent Intact, Remote Intact IQ: Average Insight: Fair Impulse Control: Poor Appetite: Fair Weight Loss:  (0) Weight Gain: 0 Sleep: No Change Total Hours of Sleep:  (6) Vegetative Symptoms: None  ADLScreening [facility] Assessment Services) Patient's cognitive ability adequate to safely complete daily activities?: Yes Patient able to express need for assistance with ADLs?: Yes Independently performs ADLs?: No  Prior Inpatient Therapy Prior Inpatient Therapy: No  Prior Outpatient Therapy Prior Outpatient Therapy: No Does patient have an ACCT team?: No Does patient have  Intensive In-House Services?  : No Does patient have [facility] services? : No Does patient have P4CC services?: No  ADL Screening (condition at time of admission) Patient's cognitive ability adequate to safely complete daily activities?: Yes Is the patient deaf or have difficulty hearing?: Yes Does the patient have difficulty concentrating, remembering, or making decisions?: Yes Patient able to express need for assistance with ADLs?: Yes Does the patient have difficulty dressing or bathing?: No Independently performs ADLs?: No Does the patient have difficulty walking or climbing stairs?: Yes Weakness of Legs: Both Weakness of Arms/Hands: Both     Therapy Consults (therapy consults require a physician order) PT Evaluation Needed: No OT Evalulation Needed: No SLP Evaluation Needed: No Abuse/Neglect Assessment (Assessment to be complete while patient is alone) Physical Abuse: Denies Verbal Abuse: Denies Sexual Abuse: Denies Exploitation of patient/patient's resources: Denies Self-Neglect: Denies Values / Beliefs Cultural Requests During Hospitalization: None Spiritual Requests During Hospitalization: None Consults Spiritual Care Consult Needed: No Social Work Consult Needed: No Merchant navy officer (For Healthcare) Does Patient Have a Medical Advance Directive?: No Would patient like information on creating a medical advance directive?: No - Patient declined Nutrition Screen- [facility] Patient's home diet: Regular Has the patient recently lost weight without trying?: No Has the patient been eating poorly because of a decreased appetite?: No Malnutrition Screening Tool Score: 0  Additional Information 1:1 In Past 12 Months?: No CIRT Risk: No Elopement Risk: No Does patient have medical clearance?: Yes     Disposition:  Disposition Initial Assessment Completed for this Encounter: Yes Disposition of Patient: Other dispositions Other  disposition(s): Other (Comment) (AM  psych eval )  Lanice Shirts [facility] 12/14/2016 1:34 PM

## 2016-12-14 NOTE — ED Notes (Signed)
Pt's brother called to speak with patient

## 2016-12-14 NOTE — ED Triage Notes (Signed)
[facility] pt[facility] IVC. Reported to be SI/HI. Pt has dementia, was compliant with Sheriff. No s/s of distress noted. Calm and cooperative upon arrival to ED.

## 2016-12-14 NOTE — ED Provider Notes (Signed)
[facility] DEPT Provider Note   CSN: [id number] Arrival date & time: 12/14/16  0118     History   Chief Complaint Chief Complaint  Patient presents with  . Medical Clearance  . Suicidal    HPI Sherri [name] is a 76 y.o. female.  The patient is here under Involuntary Petition for SI/HI. When the patient is asked why she is here she says only that her husband sent her here. She does not have any complaint of fever, vomiting, illness, headache. She is resting comfortably and denies pain.    The history is provided by the patient. No language interpreter was used.    Past Medical History:  Diagnosis Date  . Diabetes mellitus without complication (HCC)   . Hyperlipidemia   . Hypertension   . Stroke [facility](HCC)     Patient Active Problem List   Diagnosis Date Noted  . Hypokalemia 11/24/2016  . History of CVA with residual deficit 11/24/2016  . Vertigo 11/23/2016  . Dementia 03/23/2016  . Stress at home 01/26/2016  . History of TIA (transient ischemic attack)   . CKD (chronic kidney disease)   . Acute CVA (cerebrovascular accident) (HCC) 08/03/2015  . CVA (cerebral infarction) 08/03/2015  . Diabetes mellitus type 2 in nonobese (HCC) 08/03/2015  . Dizziness   . Encounter for medication counseling 07/03/2015  . Adjustment disorder with mixed anxiety and depressed mood 09/16/2014  . Hyperlipidemia   . Hypertension     Past Surgical History:  Procedure Laterality Date  . EP IMPLANTABLE DEVICE N/A 08/06/2015   Procedure: Loop Recorder Insertion;  Surgeon: Hillis RangeJames Allred, MD;  [facility];  Service: Cardiovascular;  Laterality: N/A;  . TEE WITHOUT CARDIOVERSION N/A 08/06/2015   Procedure: TRANSESOPHAGEAL ECHOCARDIOGRAM (TEE);  Surgeon: Lars MassonKatarina H Nelson, MD;  [facility];  Service: Cardiovascular;  Laterality: N/A;    OB History    No data available       Home Medications    Prior to Admission medications   Medication Sig Start Date End  Date Taking? Authorizing Provider  amLODipine (NORVASC) 10 MG tablet TAKE 1 TABLET (10 MG TOTAL) BY MOUTH DAILY. 01/30/16  Yes Dianne DunAron, Talia M, MD  aspirin EC 325 MG EC tablet Take 1 tablet (325 mg total) by mouth daily. 08/07/15  Yes Penny PiaVega, [place], MD  gabapentin (NEURONTIN) 100 MG capsule Take 200 mg by mouth at bedtime.   Yes [provider]  Glucosamine-Chondroit-Vit C-Mn (GLUCOSAMINE 1500 COMPLEX) CAPS Take 1 capsule by mouth daily.    Yes [provider]  mirtazapine (REMERON) 15 MG tablet Take 1 tablet (15 mg total) by mouth at bedtime. 10/07/16  Yes Dianne DunAron, Talia M, MD  omega-3 acid ethyl esters (LOVAZA) 1 g capsule Take 1 g by mouth daily.   Yes [provider]  rosuvastatin (CRESTOR) 5 MG tablet TAKE 1 TABLET (5 MG TOTAL) BY MOUTH DAILY. Patient taking differently: Take 5 mg by mouth at bedtime.  11/17/16  Yes Dianne DunAron, Talia M, MD  traMADol (ULTRAM) 50 MG tablet Take 50 mg by mouth every 6 (six) hours as needed for moderate pain.    Yes [provider]    Family History Family History  Problem Relation Age of Onset  . Hypertension Mother   . Hypertension Father     Social History Social History  Substance Use Topics  . Smoking status: Never Smoker  . Smokeless tobacco: Never Used  . Alcohol use No     Allergies  Hydrogen peroxide and Sulfa antibiotics   Review of Systems Review of Systems  Constitutional: Negative for chills and fever.  HENT: Negative.   Respiratory: Negative.   Cardiovascular: Negative.   Gastrointestinal: Negative.   Musculoskeletal: Negative.   Skin: Negative.   Neurological: Negative.   Psychiatric/Behavioral: Positive for dysphoric mood and suicidal ideas.     Physical Exam Updated Vital Signs BP 128/78 (BP Location: Left Arm)   Pulse 91   Temp 98.1 F (36.7 C) (Oral)   Resp 12   SpO2 98%   Physical Exam  Constitutional: She is oriented to person, place, and time. She appears well-developed and  well-nourished.  Sleeping initially, easily awakened.  Neck: Normal range of motion.  Cardiovascular: Normal rate.   Pulmonary/Chest: Effort normal. No respiratory distress.  Neurological: She is alert and oriented to person, place, and time.  Skin: Skin is warm and dry.     ED Treatments / Results  Labs (all labs ordered are listed, but only abnormal results are displayed) Labs Reviewed  COMPREHENSIVE METABOLIC PANEL - Abnormal; Notable for the following:       Result Value   Potassium 3.4 (*)    Glucose, Bld 100 (*)    Creatinine, Ser 1.06 (*)    GFR calc non Af Amer 49 (*)    GFR calc Af Amer 57 (*)    All other components within normal limits  ACETAMINOPHEN LEVEL - Abnormal; Notable for the following:    Acetaminophen (Tylenol), Serum <10 (*)    All other components within normal limits  URINALYSIS, ROUTINE W REFLEX MICROSCOPIC - Abnormal; Notable for the following:    Protein, ur 30 (*)    Leukocytes, UA TRACE (*)    Bacteria, UA RARE (*)    Squamous Epithelial / LPF 0-5 (*)    All other components within normal limits  ETHANOL  SALICYLATE LEVEL  CBC  RAPID URINE DRUG SCREEN, HOSP PERFORMED  CBG MONITORING, ED    EKG  EKG Interpretation None       Radiology No results found.  Procedures Procedures (including critical care time)  Medications Ordered in ED Medications  amLODipine (NORVASC) tablet 10 mg (not administered)  gabapentin (NEURONTIN) capsule 200 mg (not administered)  mirtazapine (REMERON) tablet 15 mg (not administered)  rosuvastatin (CRESTOR) tablet 5 mg (not administered)     Initial Impression / Assessment and Plan / ED Course  I have reviewed the triage vital signs and the nursing notes.  Pertinent labs & imaging results that were available during my care of the patient were reviewed by me and considered in my medical decision making (see chart for details).     Patient under IVC for reported SI. TTS consultation requested to  determine disposition.   Final Clinical Impressions(s) / ED Diagnoses   Final diagnoses:  None   1. IVC, SI  New Prescriptions New Prescriptions   No medications on file     Elpidio Anis, Cordelia Poche 12/14/16 [id number]    Molpus, Jonny Ruiz, MD 12/14/16 [phone]

## 2016-12-14 NOTE — ED Notes (Signed)
Bed: [id number] Expected date:  Expected time:  Means of arrival:  Comments: IVC/elderly with dementia

## 2016-12-14 NOTE — ED Notes (Signed)
Pt sleeping comfortably in no acute distress.  Equal chest rise and fall with non-labored breathing noted.

## 2016-12-15 DIAGNOSIS — R413 Other amnesia: Secondary | ICD-10-CM | POA: Diagnosis not present

## 2016-12-15 DIAGNOSIS — F0391 Unspecified dementia with behavioral disturbance: Secondary | ICD-10-CM

## 2016-12-15 MED ORDER — CITALOPRAM HYDROBROMIDE 10 MG PO TABS: 10.0000 mg | ORAL_TABLET | Freq: Every day | ORAL | 0 refills | Status: DC

## 2016-12-15 MED ORDER — QUETIAPINE FUMARATE 25 MG PO TABS: 25.0000 mg | ORAL_TABLET | Freq: Every day | ORAL | 0 refills | Status: DC

## 2016-12-15 NOTE — ED Notes (Signed)
Patient's son coming to pick her up.

## 2016-12-15 NOTE — BH Assessment (Addendum)
[facility] Assessment Progress Note  Per Thedore MinsMojeed Akintayo, MD, this pt does not require psychiatric hospitalization at this time.  Pt presents under IVC initiated by her spouse, which Dr Jannifer FranklinAkintayo has rescinded.  [facility] to follow up with her neurologist.  At 10:40 this writer called Durward Mallardamille, Nurse Case Manager, to ask her to facilitate this.  No behavioral health follow up is necessary.  Pt's nurse, Aram BeechamCynthia, has been notified.  Doylene Canninghomas Berna Gitto, MA Triage Specialist [phone]   Addendum:  Durward MallardCamille calls back, reporting that pt does not currently appear to have a neurologist.  This writer called EDP Tilden FossaElizabeth Rees, MD, to ask her to offer pt a referral to a neurologist, to which Dr Madilyn Hookees agrees.  Doylene Canninghomas Shariece Viveiros, MA Triage Specialist [phone]

## 2016-12-15 NOTE — ED Notes (Signed)
Called patient's husband to come pick her up, but he said he did not feel safe with patient and that her brothers are taking over her care.  Called brother and he is in [place], but says he will call one of patient's children to pick her up until he can get back to [place].

## 2016-12-15 NOTE — Consult Note (Signed)
[facility] Face-to-Face Psychiatry Consult   Reason for Consult:  Aggression and suicidal statement Referring Physician:  EDP Patient Identification: Sherri [name] MRN:  [id number] Principal Diagnosis: Adjustment disorder with mixed anxiety and depressed mood Diagnosis:   Patient Active Problem List   Diagnosis Date Noted  . 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 Hypokalemia [E87.6] 11/24/2016  . 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 Vertigo [R42] 11/23/2016  . Stress at home [F43.9] 01/26/2016  . History of TIA (transient ischemic attack) [Z86.73]   . CKD (chronic kidney disease) [N18.9]   . Acute CVA (cerebrovascular accident) ([place]) [I63.9] 08/03/2015  . CVA (cerebral infarction) [I63.9] 08/03/2015  . Diabetes mellitus type 2 in nonobese ([place]) [E11.9] 08/03/2015  . Dizziness [R42]   . Encounter for medication counseling [Z71.89] 07/03/2015  . Adjustment disorder with mixed anxiety and depressed mood [F43.23] 09/16/2014  . Hyperlipidemia [E78.5]   . Hypertension [I10]     Total Time spent with patient: 45 minutes  Subjective:   Sherri [name] is a 76 y.o. female patient does not warrant admission.  HPI:  76 yo female who presented to the ED with aggression and suicidal ideations.  Medications were started and she stabilized.  Calm and cooperative with no suicidal/homicidal ideations, hallucinations, and alcohol/drug abuse.  Stable for discharge.  Past Psychiatric History: dementia  Risk to Self: Suicidal Ideation: No Suicidal Intent: No Is patient at risk for suicide?: No Suicidal Plan?: No Access to Means: No What has been your use of drugs/alcohol within the last 12 months?: no How many times?: 0 Other Self Harm Risks: 0 Triggers for Past Attempts: Unknown Intentional Self Injurious Behavior: None Risk to Others: Homicidal Ideation: No Thoughts of Harm to Others: No Current Homicidal Intent: No Current  Homicidal Plan: No Access to Homicidal Means: No Identified Victim: none History of harm to others?: No Assessment of Violence: None Noted Violent Behavior Description: "scratched husband" Does patient have access to weapons?: No Criminal Charges Pending?: No Does patient have a court date: No Prior Inpatient Therapy: Prior Inpatient Therapy: No Prior Outpatient Therapy: Prior Outpatient Therapy: No Does patient have an ACCT team?: No Does patient have Intensive In-House Services?  : No Does patient have [facility] services? : No Does patient have P4CC services?: No  Past Medical History:  Past Medical History:  Diagnosis Date  . Diabetes mellitus without complication ([place])   . Hyperlipidemia   . Hypertension   . Stroke [facility])     Past Surgical History:  Procedure Laterality Date  . EP IMPLANTABLE DEVICE N/A 08/06/2015   Procedure: Loop Recorder Insertion;  Surgeon: Thompson Grayer, MD;  Location: [facility];  Service: Cardiovascular;  Laterality: N/A;  . TEE WITHOUT CARDIOVERSION N/A 08/06/2015   Procedure: TRANSESOPHAGEAL ECHOCARDIOGRAM (TEE);  Surgeon: Dorothy Spark, MD;  Location: [facility] ENDOSCOPY;  Service: Cardiovascular;  Laterality: N/A;   Family History:  Family History  Problem Relation Age of Onset  . Hypertension Mother   . Hypertension Father    Family Psychiatric  History: none Social History:  History  Alcohol Use No     History  Drug Use No    Social History   Social History  . Marital status: Married    Spouse name: N/A  . Number of children: N/A  . Years of education: N/A   Social History Main Topics  . Smoking status: Never Smoker  . Smokeless tobacco: Never Used  .  Alcohol use No  . Drug use: No  . Sexual activity: No   Other Topics Concern  . None   Social History Narrative   Desires CPR.   Would not want prolonged life support if futile   Additional Social History:    Allergies:   Allergies  Allergen Reactions  . Hydrogen  Peroxide Swelling and Other (See Comments)    Reaction:  Localized swelling  . Sulfa Antibiotics Other (See Comments)    Reaction:  Unknown     Labs:  Results for orders placed or performed during the hospital encounter of 12/14/16 (from the past 48 hour(s))  Comprehensive metabolic panel     Status: Abnormal   Collection Time: 12/14/16  1:32 AM  Result Value Ref Range   Sodium 144 135 - 145 mmol/L   Potassium 3.4 (L) 3.5 - 5.1 mmol/L   Chloride 108 101 - 111 mmol/L   CO2 25 22 - 32 mmol/L   Glucose, Bld 100 (H) 65 - 99 mg/dL   BUN 14 6 - 20 mg/dL   Creatinine, Ser 1.06 (H) 0.44 - 1.00 mg/dL   Calcium 9.7 8.9 - 10.3 mg/dL   Total Protein 7.2 6.5 - 8.1 g/dL   Albumin 4.0 3.5 - 5.0 g/dL   AST 19 15 - 41 U/L   ALT 16 14 - 54 U/L   Alkaline Phosphatase 84 38 - 126 U/L   Total Bilirubin 0.6 0.3 - 1.2 mg/dL   GFR calc non Af Amer 49 (L) >60 mL/min   GFR calc Af Amer 57 (L) >60 mL/min    Comment: (NOTE) The eGFR has been calculated using the CKD EPI equation. This calculation has not been validated in all clinical situations. eGFR's persistently <60 mL/min signify possible Chronic Kidney Disease.    Anion gap 11 5 - 15  Ethanol     Status: None   Collection Time: 12/14/16  1:32 AM  Result Value Ref Range   Alcohol, Ethyl (B) <5 <5 mg/dL    Comment:        LOWEST DETECTABLE LIMIT FOR SERUM ALCOHOL IS 5 mg/dL FOR MEDICAL PURPOSES ONLY   Salicylate level     Status: None   Collection Time: 12/14/16  1:32 AM  Result Value Ref Range   Salicylate Lvl <7.8 2.8 - 30.0 mg/dL  Acetaminophen level     Status: Abnormal   Collection Time: 12/14/16  1:32 AM  Result Value Ref Range   Acetaminophen (Tylenol), Serum <10 (L) 10 - 30 ug/mL    Comment:        THERAPEUTIC CONCENTRATIONS VARY SIGNIFICANTLY. A RANGE OF 10-30 ug/mL MAY BE AN EFFECTIVE CONCENTRATION FOR MANY PATIENTS. HOWEVER, SOME ARE BEST TREATED AT CONCENTRATIONS OUTSIDE THIS RANGE. ACETAMINOPHEN CONCENTRATIONS >150  ug/mL AT 4 HOURS AFTER INGESTION AND >50 ug/mL AT 12 HOURS AFTER INGESTION ARE OFTEN ASSOCIATED WITH TOXIC REACTIONS.   cbc     Status: None   Collection Time: 12/14/16  1:32 AM  Result Value Ref Range   WBC 6.2 4.0 - 10.5 K/uL   RBC 4.34 3.87 - 5.11 MIL/uL   Hemoglobin 12.4 12.0 - 15.0 g/dL   HCT 38.2 36.0 - 46.0 %   MCV 88.0 78.0 - 100.0 fL   MCH 28.6 26.0 - 34.0 pg   MCHC 32.5 30.0 - 36.0 g/dL   RDW 14.5 11.5 - 15.5 %   Platelets 230 150 - 400 K/uL  Rapid urine drug screen (hospital performed)  Status: None   Collection Time: 12/14/16  3:37 AM  Result Value Ref Range   Opiates NONE DETECTED NONE DETECTED   Cocaine NONE DETECTED NONE DETECTED   Benzodiazepines NONE DETECTED NONE DETECTED   Amphetamines NONE DETECTED NONE DETECTED   Tetrahydrocannabinol NONE DETECTED NONE DETECTED   Barbiturates NONE DETECTED NONE DETECTED    Comment:        DRUG SCREEN FOR MEDICAL PURPOSES ONLY.  IF CONFIRMATION IS NEEDED FOR ANY PURPOSE, NOTIFY LAB WITHIN 5 DAYS.        LOWEST DETECTABLE LIMITS FOR URINE DRUG SCREEN Drug Class       Cutoff (ng/mL) Amphetamine      1000 Barbiturate      200 Benzodiazepine   324 Tricyclics       401 Opiates          300 Cocaine          300 THC              50   Urinalysis, Routine w reflex microscopic     Status: Abnormal   Collection Time: 12/14/16  3:38 AM  Result Value Ref Range   Color, Urine YELLOW YELLOW   APPearance CLEAR CLEAR   Specific Gravity, Urine 1.011 1.005 - 1.030   pH 6.0 5.0 - 8.0   Glucose, UA NEGATIVE NEGATIVE mg/dL   Hgb urine dipstick NEGATIVE NEGATIVE   Bilirubin Urine NEGATIVE NEGATIVE   Ketones, ur NEGATIVE NEGATIVE mg/dL   Protein, ur 30 (A) NEGATIVE mg/dL   Nitrite NEGATIVE NEGATIVE   Leukocytes, UA TRACE (A) NEGATIVE   RBC / HPF 0-5 0 - 5 RBC/hpf   WBC, UA 0-5 0 - 5 WBC/hpf   Bacteria, UA RARE (A) NONE SEEN   Squamous Epithelial / LPF 0-5 (A) NONE SEEN   Mucous PRESENT   CBG monitoring, ED     Status:  None   Collection Time: 12/14/16  6:24 AM  Result Value Ref Range   Glucose-Capillary 93 65 - 99 mg/dL    Current Facility-Administered Medications  Medication Dose Route Frequency Provider Last Rate Last Dose  . amLODipine (NORVASC) tablet 10 mg  10 mg Oral Daily Charlann Lange, PA-C   10 mg at 12/15/16 0272  . citalopram (CELEXA) tablet 10 mg  10 mg Oral Daily Imad Shostak, MD   10 mg at 12/15/16 0952  . gabapentin (NEURONTIN) capsule 200 mg  200 mg Oral QHS Upstill, Nehemiah Settle, PA-C   200 mg at 12/14/16 2114  . QUEtiapine (SEROQUEL) tablet 25 mg  25 mg Oral QHS Brook Geraci, MD   25 mg at 12/14/16 2114  . rosuvastatin (CRESTOR) tablet 5 mg  5 mg Oral QHS Upstill, Nehemiah Settle, PA-C   5 mg at 12/14/16 2114   Current Outpatient Prescriptions  Medication Sig Dispense Refill  . amLODipine (NORVASC) 10 MG tablet TAKE 1 TABLET (10 MG TOTAL) BY MOUTH DAILY. 90 tablet 1  . aspirin EC 325 MG EC tablet Take 1 tablet (325 mg total) by mouth daily. 30 tablet 0  . gabapentin (NEURONTIN) 100 MG capsule Take 200 mg by mouth at bedtime.  1  . Glucosamine-Chondroit-Vit C-Mn (GLUCOSAMINE 1500 COMPLEX) CAPS Take 1 capsule by mouth daily.     . mirtazapine (REMERON) 15 MG tablet Take 1 tablet (15 mg total) by mouth at bedtime. 90 tablet 0  . omega-3 acid ethyl esters (LOVAZA) 1 g capsule Take 1 g by mouth daily.    . rosuvastatin (CRESTOR) 5 MG  tablet TAKE 1 TABLET (5 MG TOTAL) BY MOUTH DAILY. (Patient taking differently: Take 5 mg by mouth at bedtime. ) 90 tablet 0  . traMADol (ULTRAM) 50 MG tablet Take 50 mg by mouth every 6 (six) hours as needed for moderate pain.       Musculoskeletal: Strength & Muscle Tone: within normal limits Gait & Station: normal Patient leans: N/A  Psychiatric Specialty Exam: Physical Exam  Constitutional: She is oriented to person, place, and time. She appears well-developed and well-nourished.  HENT:  Head: Normocephalic.  Neck: Normal range of motion.  Respiratory:  Effort normal.  Musculoskeletal: Normal range of motion.  Neurological: She is alert and oriented to person, place, and time.  Psychiatric: She has a normal mood and affect. Her speech is normal and behavior is normal. Judgment and thought content normal. Cognition and memory are impaired.    Review of Systems  Psychiatric/Behavioral: Positive for memory loss.  All other systems reviewed and are negative.   Blood pressure (!) 147/83, pulse 75, temperature 98.5 F (36.9 C), temperature source Oral, resp. rate 16, SpO2 94 %.There is no height or weight on file to calculate BMI.  General Appearance: Casual  Eye Contact:  Good  Speech:  Normal Rate  Volume:  Normal  Mood:  Euthymic  Affect:  Congruent  Thought Process:  Coherent and Descriptions of Associations: Intact  Orientation:  Full (Time, Place, and Person)  Thought Content:  WDL and Logical  Suicidal Thoughts:  No  Homicidal Thoughts:  No  Memory:  Immediate;   Fair Recent;   Fair Remote;   Fair  Judgement:  Fair  Insight:  Fair  Psychomotor Activity:  Normal  Concentration:  Concentration: Good and Attention Span: Good  Recall:  [employer] of Knowledge:  Fair  Language:  Good  Akathisia:  No  Handed:  Right  AIMS (if indicated):     Assets:  Housing Intimacy Leisure Time Physical Health Resilience Social Support  ADL's:  Intact  Cognition:  WNL  Sleep:        Treatment Plan Summary: Daily contact with patient to assess and evaluate symptoms and progress in treatment, Medication management and Plan dementia with aggression:  -Crisis stabilization -Medication management:  Continued medical medications along with Seroquel 25 mg at bedtime for mood and Celexa 10 mg daily for depression -Individual counseling  Disposition: No evidence of imminent risk to self or others at present.    Waylan Boga, NP 12/15/2016 10:42 AM  Patient seen face-to-face for psychiatric evaluation, chart reviewed and case discussed  with the physician extender and developed treatment plan. Reviewed the information documented and agree with the treatment plan. Corena Pilgrim, MD

## 2016-12-15 NOTE — [facility] Suicide Risk Assessment (Signed)
Suicide Risk Assessment  Discharge Assessment   [facility] Discharge Suicide Risk Assessment   Principal Problem: Adjustment disorder with mixed anxiety and depressed mood Discharge Diagnoses:  Patient Active Problem List   Diagnosis Date Noted  . 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 Hypokalemia [E87.6] 11/24/2016  . 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 Vertigo [R42] 11/23/2016  . Stress at home [F43.9] 01/26/2016  . History of TIA (transient ischemic attack) [Z86.73]   . CKD (chronic kidney disease) [N18.9]   . Acute CVA (cerebrovascular accident) ([facility]) [I63.9] 08/03/2015  . CVA (cerebral infarction) [I63.9] 08/03/2015  . Diabetes mellitus type 2 in nonobese ([facility]) [E11.9] 08/03/2015  . Dizziness [R42]   . Encounter for medication counseling [Z71.89] 07/03/2015  . Adjustment disorder with mixed anxiety and depressed mood [F43.23] 09/16/2014  . Hyperlipidemia [E78.5]   . Hypertension [I10]     Total Time spent with patient: 45 minutes  Musculoskeletal: Strength & Muscle Tone: within normal limits Gait & Station: normal Patient leans: N/A  Psychiatric Specialty Exam: Physical Exam  Constitutional: She is oriented to person, place, and time. She appears well-developed and well-nourished.  HENT:  Head: Normocephalic.  Neck: Normal range of motion.  Respiratory: Effort normal.  Musculoskeletal: Normal range of motion.  Neurological: She is alert and oriented to person, place, and time.  Psychiatric: She has a normal mood and affect. Her speech is normal and behavior is normal. Judgment and thought content normal. Cognition and memory are impaired.    Review of Systems  Psychiatric/Behavioral: Positive for memory loss.  All other systems reviewed and are negative.   Blood pressure (!) 147/83, pulse 75, temperature 98.5 F (36.9 C), temperature source Oral, resp. rate 16, SpO2 94 %.There is no height or weight on file to  calculate BMI.  General Appearance: Casual  Eye Contact:  Good  Speech:  Normal Rate  Volume:  Normal  Mood:  Euthymic  Affect:  Congruent  Thought Process:  Coherent and Descriptions of Associations: Intact  Orientation:  Full (Time, Place, and Person)  Thought Content:  WDL and Logical  Suicidal Thoughts:  No  Homicidal Thoughts:  No  Memory:  Immediate;   Fair Recent;   Fair Remote;   Fair  Judgement:  Fair  Insight:  Fair  Psychomotor Activity:  Normal  Concentration:  Concentration: Good and Attention Span: Good  Recall:  [employer] of Knowledge:  Fair  Language:  Good  Akathisia:  No  Handed:  Right  AIMS (if indicated):     Assets:  Housing Intimacy Leisure Time Physical Health Resilience Social Support  ADL's:  Intact  Cognition:  WNL  Sleep:       Mental Status Per Nursing Assessment::   On Admission:   aggression and suicidal ideations  Demographic Factors:  Age 45 or older  Loss Factors: Decline in physical health  Historical Factors: NA  Risk Reduction Factors:   Sense of responsibility to family, Living with another person, especially a relative and Positive social support  Continued Clinical Symptoms:  None  Cognitive Features That Contribute To Risk:  None    Suicide Risk:  Minimal: No identifiable suicidal ideation.  Patients presenting with no risk factors but with morbid ruminations; may be classified as minimal risk based on the severity of the depressive symptoms    Plan Of Care/Follow-up recommendations:  Activity:  as tolerated Diet:  heart healhty diet  Nanine Means, NP  12/15/2016, 10:49 AM

## 2016-12-17 ENCOUNTER — Telehealth: Payer: Self-pay | Admitting: Family Medicine

## 2016-12-17 NOTE — Telephone Encounter (Signed)
Fayrene FearingJames stopped by to request a call regarding [facility].

## 2016-12-20 NOTE — Telephone Encounter (Signed)
Unable to leave VM

## 2016-12-21 NOTE — Telephone Encounter (Signed)
Left message to return call 

## 2016-12-29 ENCOUNTER — Telehealth: Payer: Self-pay

## 2016-12-29 NOTE — Telephone Encounter (Signed)
Yes absolutely ok to give verbal order as requested.

## 2016-12-29 NOTE — Telephone Encounter (Signed)
Verbal orders were given to Jaqueline.

## 2016-12-29 NOTE — Telephone Encounter (Signed)
Adela LankJacqueline medical social worker with [facility] left v/m with urgent need; Adela LankJacqueline saw pt 2 weeks ago and found pt to be in fairly advanced dementia; when Adela LankJacqueline spoke with nurse today, pts family has not implemented any of care plan discussed 2 wks ago so pt will not be left alone; which is not appropriate for pt to be left alone. Pts brother is with pt now but pts brother plans to leave to go home to [place] on 12/30/16.Adela LankJacqueline [facility] to [facility] to Adela LankJacqueline today 12/29/16 so can set up meeting with pts brother and pts husband on 12/29/16. Adela LankJacqueline request cb ASAP. Adela LankJacqueline said this may be an adult protective services case and after eval Adela LankJacqueline will let Dr [name] know if this is an adult protective case.

## 2017-01-07 ENCOUNTER — Other Ambulatory Visit: Payer: Self-pay | Admitting: Family Medicine

## 2017-01-07 NOTE — Telephone Encounter (Signed)
Last refill 10/07/16 Last OV 11/29/16  Ok to refill?

## 2017-01-10 ENCOUNTER — Other Ambulatory Visit: Payer: Self-pay

## 2017-01-10 MED ORDER — AMLODIPINE BESYLATE 10 MG PO TABS: 10.0000 mg | ORAL_TABLET | Freq: Every day | ORAL | 1 refills | Status: DC

## 2017-01-10 NOTE — Telephone Encounter (Signed)
Fayrene FearingJames pt brother (DPR signed) left v/m requesting refill amlodipine; pt is currently in [place] indefinitely; [facility] [place] Fayrene FearingJames notified done. Fayrene FearingJames request med list mailed to [facility]; [address] [place], [place] [id number]. (done)

## 2017-02-24 ENCOUNTER — Other Ambulatory Visit: Payer: Self-pay | Admitting: Family Medicine

## 2017-03-29 ENCOUNTER — Other Ambulatory Visit: Payer: Self-pay | Admitting: Family Medicine

## 2017-03-31 ENCOUNTER — Other Ambulatory Visit: Payer: Self-pay | Admitting: Family Medicine

## 2017-07-14 ENCOUNTER — Other Ambulatory Visit: Payer: Self-pay | Admitting: Family Medicine

## 2017-08-11 ENCOUNTER — Other Ambulatory Visit: Payer: Self-pay | Admitting: Family Medicine

## 2017-08-18 ENCOUNTER — Other Ambulatory Visit: Payer: Self-pay | Admitting: Family Medicine

## 2017-09-25 ENCOUNTER — Other Ambulatory Visit: Payer: Self-pay | Admitting: Family Medicine

## 2017-10-16 ENCOUNTER — Telehealth: Payer: Self-pay | Admitting: Family Medicine

## 2017-10-24 ENCOUNTER — Other Ambulatory Visit: Payer: Self-pay | Admitting: Family Medicine

## 2017-10-24 MED ORDER — ROSUVASTATIN CALCIUM 5 MG PO TABS: 5.0000 mg | ORAL_TABLET | Freq: Every day | ORAL | 0 refills | Status: DC

## 2017-10-24 NOTE — Telephone Encounter (Signed)
Patient is out of town with her dad and has an upcoming appointment on 11/09/17. She would like a 30 day supply sent to a pharmacy in [place].  Pharmacy: [id number] [place] - [address] [phone] (Phone) [phone] (Fax)

## 2017-10-24 NOTE — Telephone Encounter (Signed)
Copied from CRM [id number]. Topic: Quick Communication - Rx Refill/Question >> Oct 24, 2017  1:18 PM Crist Infante wrote: Medication: rosuvastatin (CRESTOR) 5 MG tablet  Has the patient contacted their pharmacy? Yes Pt is in [place] with her dad Pt has made an appt for 6/5 but is out of this med.  Can you send in 30 days to get her through? [id number] [place] - [address] [phone] (Phone) [phone] (Fax)

## 2017-10-24 NOTE — Telephone Encounter (Signed)
Rx has been sent in for 30 days to patient's requested pharmacy.

## 2017-11-07 ENCOUNTER — Other Ambulatory Visit: Payer: Self-pay | Admitting: Family Medicine

## 2017-11-08 NOTE — Telephone Encounter (Signed)
Pt has OV on 6.5.19/will send in Rx then/thx dmf

## 2017-11-09 ENCOUNTER — Ambulatory Visit: Payer: Self-pay | Admitting: Family Medicine

## 2017-11-15 ENCOUNTER — Ambulatory Visit: Payer: Medicare Other | Admitting: Family Medicine

## 2017-11-17 ENCOUNTER — Ambulatory Visit: Payer: Medicare Other | Admitting: Family Medicine

## 2017-11-17 ENCOUNTER — Encounter: Payer: Self-pay | Admitting: Family Medicine

## 2017-11-17 VITALS — BP 138/80 | HR 92 | Temp 98.3°F | Ht 66.5 in | Wt 182.4 lb

## 2017-11-17 DIAGNOSIS — Z1231 Encounter for screening mammogram for malignant neoplasm of breast: Secondary | ICD-10-CM | POA: Diagnosis not present

## 2017-11-17 DIAGNOSIS — I10 Essential (primary) hypertension: Secondary | ICD-10-CM

## 2017-11-17 DIAGNOSIS — Z1239 Encounter for other screening for malignant neoplasm of breast: Secondary | ICD-10-CM

## 2017-11-17 DIAGNOSIS — E2839 Other primary ovarian failure: Secondary | ICD-10-CM

## 2017-11-17 DIAGNOSIS — N184 Chronic kidney disease, stage 4 (severe): Secondary | ICD-10-CM

## 2017-11-17 DIAGNOSIS — E785 Hyperlipidemia, unspecified: Secondary | ICD-10-CM | POA: Diagnosis not present

## 2017-11-17 MED ORDER — MIRTAZAPINE 15 MG PO TABS
ORAL_TABLET | ORAL | 1 refills | Status: DC
Start: 2017-11-17 — End: 2018-05-12

## 2017-11-17 MED ORDER — ROSUVASTATIN CALCIUM 5 MG PO TABS: 5.0000 mg | ORAL_TABLET | Freq: Every day | ORAL | 0 refills | Status: DC

## 2017-11-17 MED ORDER — AMLODIPINE BESYLATE 10 MG PO TABS: 10.0000 mg | ORAL_TABLET | Freq: Every day | ORAL | 1 refills | Status: DC

## 2017-11-17 NOTE — Assessment & Plan Note (Signed)
Well controlled. No changes made.  Seems much happier and healthier in new living situation.

## 2017-11-17 NOTE — Addendum Note (Signed)
Addended by: Arva ChafeGARCIA, Jenniferlynn Saad M on: 11/17/2017 10:59 AM   Modules accepted: Orders

## 2017-11-17 NOTE — Patient Instructions (Addendum)
Great to see you. I will call you with your lab results from today and you can view them online.   Please call the breast center at [phone] to schedule your mammogram and bone density test.

## 2017-11-17 NOTE — Progress Notes (Signed)
Subjective:   Patient ID: Sherri [name], female    DOB: 1940-11-04, 77 y.o.   MRN: [id number]  Sherri [name] is a pleasant 77 y.o. year old female who presents to clinic today with Follow-up (Patient is here today to F/U with BP meds.  )  on 11/17/2017  HPI: Patient is here today with her brother to F/U with BP meds. She is here today with her brother and they will go back to [place] on Friday. PSQ-9 completed. [facility] and [facility]a [facility] to see if can get it done before they leave on Friday. Brother goes over medications and states that he has never been given a prescription for Celexa and Quetiapine.  Has been doing very well.  Moved up to [place] with her brother.  Home situation is much better.  Eating now.  Depression has improved drastically.  Has been doing PT up there which has also helped.  HTN- BP has been controlled on current dose of amlodipine.  Denies any HA, blurred vision, CP, LE edema or SOB.  Lab Results  Component Value Date   CREATININE 1.06 (H) 12/14/2016    She is taking Remeron but has not been taking Seroquel or Celexa.  HLD- taking Crestor 5 mg daily.  Lab Results  Component Value Date   CHOL 209 (H) 11/24/2016   HDL 47 11/24/2016   LDLCALC 148 (H) 11/24/2016   TRIG 68 11/24/2016   CHOLHDL 4.4 11/24/2016    Current Outpatient Medications on File Prior to Visit  Medication Sig Dispense Refill  . aspirin EC 325 MG EC tablet Take 1 tablet (325 mg total) by mouth daily. 30 tablet 0  . gabapentin (NEURONTIN) 100 MG capsule Take 200 mg by mouth at bedtime.  1  . Glucosamine-Chondroit-Vit C-Mn (GLUCOSAMINE 1500 COMPLEX) CAPS Take 1 capsule by mouth daily.     Marland Kitchen omega-3 acid ethyl esters (LOVAZA) 1 g capsule Take 1 g by mouth daily.    . traMADol (ULTRAM) 50 MG tablet Take 50 mg by mouth every 6 (six) hours as needed for moderate pain.     Marland Kitchen QUEtiapine (SEROQUEL) 25 MG tablet Take 1 tablet (25 mg total) by mouth at bedtime.  (Patient not taking: Reported on 11/17/2017) 30 tablet 0   No current facility-administered medications on file prior to visit.     Allergies  Allergen Reactions  . Hydrogen Peroxide Swelling and Other (See Comments)    Reaction:  Localized swelling  . Sulfa Antibiotics Other (See Comments)    Reaction:  Unknown     Past Medical History:  Diagnosis Date  . Diabetes mellitus without complication (HCC)   . Hyperlipidemia   . Hypertension   . Stroke [facility])     Past Surgical History:  Procedure Laterality Date  . EP IMPLANTABLE DEVICE N/A 08/06/2015   Procedure: Loop Recorder Insertion;  Surgeon: Hillis Range, MD;  [facility];  Service: Cardiovascular;  Laterality: N/A;  . TEE WITHOUT CARDIOVERSION N/A 08/06/2015   Procedure: TRANSESOPHAGEAL ECHOCARDIOGRAM (TEE);  Surgeon: Lars Masson, MD;  Location: [facility] ENDOSCOPY;  Service: Cardiovascular;  Laterality: N/A;    Family History  Problem Relation Age of Onset  . Hypertension Mother   . Hypertension Father     Social History   Socioeconomic History  . Marital status: Married    Spouse name: Not on file  . Number of children: Not on file  . Years of education: Not on  file  . Highest education level: Not on file  Occupational History  . Not on file  Social Needs  . Financial resource strain: Not on file  . Food insecurity:    Worry: Not on file    Inability: Not on file  . Transportation needs:    Medical: Not on file    Non-medical: Not on file  Tobacco Use  . Smoking status: Never Smoker  . Smokeless tobacco: Never Used  Substance and Sexual Activity  . Alcohol use: No  . Drug use: No  . Sexual activity: Never  Lifestyle  . Physical activity:    Days per week: Not on file    Minutes per session: Not on file  . Stress: Not on file  Relationships  . Social connections:    Talks on phone: Not on file    Gets together: Not on file    Attends religious service: Not on file    Active member of  club or organization: Not on file    Attends meetings of clubs or organizations: Not on file    Relationship status: Not on file  . Intimate partner violence:    Fear of current or ex partner: Not on file    Emotionally abused: Not on file    Physically abused: Not on file    Forced sexual activity: Not on file  Other Topics Concern  . Not on file  Social History Narrative   Desires CPR.   Would not want prolonged life support if futile   The PMH, PSH, Social History, Family History, Medications, and allergies have been reviewed in [facility], and have been updated if relevant.   Review of Systems  Constitutional: Negative.   HENT: Negative.   Eyes: Negative.   Respiratory: Negative.   Cardiovascular: Negative.   Gastrointestinal: Negative.   Musculoskeletal: Negative.   Neurological: Negative.   Hematological: Negative.   Psychiatric/Behavioral: Negative.   All other systems reviewed and are negative.      Objective:    BP 138/80 (BP Location: Right Arm, Patient Position: Sitting, Cuff Size: Normal)   Pulse 92   Temp 98.3 F (36.8 C) (Oral)   Ht 5' 6.5" (1.689 m)   Wt 182 lb 6.4 oz (82.7 kg)   SpO2 94%   BMI 29.00 kg/m    Wt Readings from Last 3 Encounters:  11/17/17 182 lb 6.4 oz (82.7 kg)  11/29/16 142 lb 12 oz (64.8 kg)  11/24/16 142 lb 6.4 oz (64.6 kg)     Physical Exam   General:  Well-developed,well-nourished,in no acute distress; alert,appropriate and cooperative throughout examination Head:  normocephalic and atraumatic.   Eyes:  vision grossly intact, PERRL Ears:  R ear normal and L ear normal externally Nose:  no external deformity.   Mouth:  good dentition.   Neck:  No deformities, masses, or tenderness noted. Lungs:  Normal respiratory effort, chest expands symmetrically. Lungs are clear to auscultation, no crackles or wheezes. Heart:  Normal rate and regular rhythm. S1 and S2 normal without gallop, murmur, click, rub or other extra sounds. Msk:   No deformity or scoliosis noted of thoracic or lumbar spine.   Extremities:  No clubbing, cyanosis, edema, or deformity noted with normal full range of motion of all joints.   Neurologic:  alert & oriented X3 and gait normal.   Skin:  Intact without suspicious lesions or rashes Psych:  Cognition and judgment appear intact. Alert and cooperative with normal attention span and concentration.  No apparent delusions, illusions, hallucinations        Assessment & Plan:   Essential hypertension - Plan: Comprehensive metabolic panel, Lipid panel, TSH  Breast cancer screening - Plan: MM Digital Screening  Estrogen deficiency - Plan: DG Bone Density  Hyperlipidemia, unspecified hyperlipidemia type No follow-ups on file.

## 2017-11-17 NOTE — Assessment & Plan Note (Signed)
On crestor.  Check labs today. 

## 2017-11-17 NOTE — Assessment & Plan Note (Signed)
Labs today

## 2017-11-21 ENCOUNTER — Other Ambulatory Visit: Payer: Self-pay | Admitting: Family Medicine

## 2017-12-29 ENCOUNTER — Ambulatory Visit
Admission: RE | Admit: 2017-12-29 | Discharge: 2017-12-29 | Disposition: A | Discharge status: Home / Self Care | Payer: Medicare Other | Source: Ambulatory Visit | Attending: Family Medicine | Admitting: Family Medicine

## 2017-12-29 DIAGNOSIS — Z1239 Encounter for other screening for malignant neoplasm of breast: Secondary | ICD-10-CM

## 2017-12-29 DIAGNOSIS — E2839 Other primary ovarian failure: Secondary | ICD-10-CM

## 2018-01-19 ENCOUNTER — Ambulatory Visit: Payer: Medicare Other | Admitting: Family Medicine

## 2018-01-25 ENCOUNTER — Ambulatory Visit: Payer: Medicare Other | Admitting: Family Medicine

## 2018-01-25 DIAGNOSIS — R1110 Vomiting, unspecified: Secondary | ICD-10-CM | POA: Insufficient documentation

## 2018-01-25 DIAGNOSIS — R112 Nausea with vomiting, unspecified: Secondary | ICD-10-CM

## 2018-01-25 NOTE — Patient Instructions (Signed)
Food Poisoning Food poisoning is an illness that is caused by eating or drinking contaminated foods or drinks. Food poisoning is usually mild and lasts 1-2 days. Foods and drinks can become contaminated because of:  Poor personal hygiene, such as not washing hands well enough or often.  Not storing food properly. For example, not refrigerating raw meat.  Serving, preparing, and storing food on surfaces that are not clean.  Cooking or eating with utensils that are not clean.  The most common causes of this condition are:  Viruses.  Bacteria.  Parasites.  Follow these instructions at home: Eating and drinking   Drink enough fluids to keep your pee (urine) clear or pale yellow. You may need to drink small amounts of clear liquids often.  Avoid: ? Milk. ? Caffeine. ? Alcohol.  Ask your doctor exactly how you should get enough fluid in your body (rehydrate).  Eat small meals often rather than eating large meals. Medicines  Take over-the-counter and prescription medicines only as told by your doctor. Ask your doctor if you should continue to take any of your regular prescribed and over-the-counter medicines.  If you were prescribed an antibiotic medicine, take it as told by your doctor. Do not stop taking the antibiotic even if you start to feel better. General instructions   Wash your hands fully before you prepare food and after you go to the bathroom (use the toilet). Make sure people who live with you also wash their hands often.  Clean surfaces that you touch with a product that contains chlorine bleach.  Keep all follow-up visits as told by your doctor. This is important. How is this prevented?  Wash your hands, food preparation surfaces, and utensils before and after you handle raw foods.  Use separate food preparation surfaces and storage spaces for raw meat and for fruits and vegetables.  Keep refrigerated foods colder than 40F (5C).  Serve hot foods right  away or keep them heated above 140F (60C).  Store dry foods in cool, dry spaces. Keep them away from too much heat or moisture.  Throw out any foods that: ? Do not smell right. ? Are in cans that are bulging.  Follow approved canning procedures.  Heat canned foods fully before you taste them.  Drink bottled or sterile water when you travel. Get help right away if: Call 911 or go to the emergency room if:  You have trouble: ? Breathing. ? Swallowing. ? Talking. ? Moving.  You have blurry vision.  You cannot eat or drink without throwing up (vomiting).  You pass out (faint).  Your eyes turn yellow.  You continue to throw up or have watery poop (diarrhea).  You start to have pain in your belly (abdomen), your belly pain gets worse, or your belly pain stays in one small area.  You have a fever.  You have blood or mucus in your poop (stools), or your poop looks dark black and tarry.  You have signs of dehydration, such as: ? Dark pee, very little pee, or no pee. ? Cracked lips. ? Not making tears while crying. ? Dry mouth. ? Sunken eyes. ? Sleepiness. ? Weakness. ? Dizziness.  This information is not intended to replace advice given to you by your health care provider. Make sure you discuss any questions you have with your health care provider. Document Released: 11/11/2009 Document Revised: 10/30/2015 Document Reviewed: 11/25/2014 Elsevier Interactive Patient Education  2018 Elsevier Inc.  

## 2018-01-25 NOTE — Assessment & Plan Note (Signed)
Resolved- without diarrhea, fever.  Most consistent with food poisoning.  Brother will send copy of hospital notes- it does seem she had a thorough work up. Call or return to clinic prn if these symptoms worsen or fail to improve as anticipated. The patient indicates understanding of these issues and agrees with the plan.

## 2018-01-25 NOTE — Progress Notes (Signed)
Subjective:   Patient ID: Sherri [name], female    DOB: 06/13/1940, 77 y.o.   MRN: [id number]  Sherri [name] is a pleasant 77 y.o. year old female who presents to clinic today with Hospitalization Follow-up (Patient is here today for a hospital F/U.  [facility] due to her repeatedly vomiting.  She was D/C'ed on 8.14.19.  She is doing pretty good now.  Brother mentioned that it is up to Dr. [name] for the Tx for Osteoporosis for either oral or injection therapy.)  on 01/25/2018  HPI:   Hospital follow up-   We do not have hospital records.  She is here with her brother today.  While she [place] in [place] with [place], she was taken to the hospital on 01/17/18 for persistent, non intractable vomiting.  She is "feeling fine" now.  Per patient's brother, had significant work up in the ER- CT, EKG, etc and was kept over night and sent home.  Has not vomited since. She did eat out the night the vomiting started.  Never had a fever or diarrhea.  Had some abdominal pain but that has resolved as well. Never had any CP, SOB or diaphoresis associated with the vomiting.   Current Outpatient Medications on File Prior to Visit  Medication Sig Dispense Refill  . amLODipine (NORVASC) 10 MG tablet Take 1 tablet (10 mg total) by mouth daily. 90 tablet 1  . aspirin EC 325 MG EC tablet Take 1 tablet (325 mg total) by mouth daily. 30 tablet 0  . gabapentin (NEURONTIN) 100 MG capsule Take 200 mg by mouth at bedtime.  1  . Glucosamine-Chondroit-Vit C-Mn (GLUCOSAMINE 1500 COMPLEX) CAPS Take 1 capsule by mouth daily.     . mirtazapine (REMERON) 15 MG tablet TAKE 1 TABLET BY MOUTH EVERYDAY AT BEDTIME 90 tablet 1  . omega-3 acid ethyl esters (LOVAZA) 1 g capsule Take 1 g by mouth daily.    . rosuvastatin (CRESTOR) 5 MG tablet Take 1 tablet (5 mg total) by mouth daily. 30 tablet 0  . rosuvastatin (CRESTOR) 5 MG tablet TAKE 1 TABLET BY MOUTH EVERY DAY 30  tablet 5  . traMADol (ULTRAM) 50 MG tablet Take 50 mg by mouth every 6 (six) hours as needed for moderate pain.      No current facility-administered medications on file prior to visit.     Allergies  Allergen Reactions  . Hydrogen Peroxide Swelling and Other (See Comments)    Reaction:  Localized swelling  . Sulfa Antibiotics Other (See Comments)    Reaction:  Unknown     Past Medical History:  Diagnosis Date  . Diabetes mellitus without complication (HCC)   . Hyperlipidemia   . Hypertension   . Stroke [facility] [place] [facility](HCC)     Past Surgical History:  Procedure Laterality Date  . EP IMPLANTABLE DEVICE N/A 08/06/2015   Procedure: Loop Recorder Insertion;  Surgeon: Hillis RangeJames Allred, MD;  [facility] INVASIVE CV LAB;  Service: Cardiovascular;  Laterality: N/A;  . TEE WITHOUT CARDIOVERSION N/A 08/06/2015   Procedure: TRANSESOPHAGEAL ECHOCARDIOGRAM (TEE);  Surgeon: Lars MassonKatarina H Nelson, MD;  [facility];  Service: Cardiovascular;  Laterality: N/A;    Family History  Problem Relation Age of Onset  . Hypertension Mother   . Hypertension Father     Social History   Socioeconomic History  . Marital status: Married    Spouse name: Not on file  . Number of children: Not on file  .  Years of education: Not on file  . Highest education level: Not on file  Occupational History  . Not on file  Social Needs  . Financial resource strain: Not on file  . Food insecurity:    Worry: Not on file    Inability: Not on file  . Transportation needs:    Medical: Not on file    Non-medical: Not on file  Tobacco Use  . Smoking status: Never Smoker  . Smokeless tobacco: Never Used  Substance and Sexual Activity  . Alcohol use: No  . Drug use: No  . Sexual activity: Never  Lifestyle  . Physical activity:    Days per week: Not on file    Minutes per session: Not on file  . Stress: Not on file  Relationships  . Social connections:    Talks on phone: Not on file    Gets together: Not on file     Attends religious service: Not on file    Active member of club or organization: Not on file    Attends meetings of clubs or organizations: Not on file    Relationship status: Not on file  . Intimate partner violence:    Fear of current or ex partner: Not on file    Emotionally abused: Not on file    Physically abused: Not on file    Forced sexual activity: Not on file  Other Topics Concern  . Not on file  Social History Narrative   Desires CPR.   Would not want prolonged life support if futile   The PMH, PSH, Social History, Family History, Medications, and allergies [facility], and have been updated if relevant.  Review of Systems  Constitutional: Negative.   Eyes: Negative.   Respiratory: Negative.   Cardiovascular: Negative.   Gastrointestinal: Positive for abdominal pain, nausea and vomiting. Negative for abdominal distention, anal bleeding, blood in stool, constipation, diarrhea and rectal pain.  Endocrine: Negative.   Genitourinary: Negative.   Musculoskeletal: Negative.   Skin: Negative.   Neurological: Negative.   Hematological: Negative.   Psychiatric/Behavioral: Negative.   All other systems reviewed and are negative.      Objective:    BP 132/60 (BP Location: Left Arm, Patient Position: Sitting, Cuff Size: Normal)   Pulse 96   Temp 98.8 F (37.1 C) (Oral)   Ht 5\' 7"  (1.702 m)   Wt 184 lb 6.4 oz (83.6 kg)   SpO2 93%   BMI 28.88 kg/m   Wt Readings from Last 3 Encounters:  01/25/18 184 lb 6.4 oz (83.6 kg)  11/17/17 182 lb 6.4 oz (82.7 kg)  11/29/16 142 lb 12 oz (64.8 kg)     Physical Exam  Constitutional: She is oriented to person, place, and time. She appears well-developed and well-nourished. No distress.  HENT:  Head: Normocephalic and atraumatic.  Eyes: EOM are normal.  Neck: Normal range of motion.  Cardiovascular: Normal rate.  Pulmonary/Chest: Effort normal.  Abdominal: Soft. She exhibits no distension. There is no tenderness.  There is no rebound and no guarding.  Musculoskeletal: Normal range of motion.  Neurological: She is alert and oriented to person, place, and time. No cranial nerve deficit.  Skin: Skin is warm and dry. She is not diaphoretic.  Psychiatric: She has a normal mood and affect. Her behavior is normal. Judgment and thought content normal.  Nursing note and vitals reviewed.         Assessment & Plan:   Non-intractable vomiting  with nausea, unspecified vomiting type No follow-ups on file.

## 2018-03-15 ENCOUNTER — Other Ambulatory Visit: Payer: Self-pay | Admitting: Family Medicine

## 2018-04-25 ENCOUNTER — Other Ambulatory Visit: Payer: Self-pay | Admitting: Family Medicine

## 2018-05-12 ENCOUNTER — Other Ambulatory Visit: Payer: Self-pay | Admitting: Family Medicine

## 2018-10-27 ENCOUNTER — Other Ambulatory Visit: Payer: Self-pay

## 2018-10-27 MED ORDER — ROSUVASTATIN CALCIUM 5 MG PO TABS: 5.0000 mg | ORAL_TABLET | Freq: Every day | ORAL | 0 refills | Status: DC

## 2019-01-17 ENCOUNTER — Other Ambulatory Visit: Payer: Self-pay | Admitting: Family Medicine

## 2019-01-25 ENCOUNTER — Other Ambulatory Visit: Payer: Self-pay

## 2019-01-25 MED ORDER — MIRTAZAPINE 15 MG PO TABS: ORAL_TABLET | ORAL | 1 refills | Status: AC

## 2019-02-25 ENCOUNTER — Other Ambulatory Visit: Payer: Self-pay

## 2019-02-25 ENCOUNTER — Emergency Department (HOSPITAL_COMMUNITY)
Admission: EM | Admit: 2019-02-25 | Discharge: 2019-02-26 | Disposition: A | Discharge status: Home / Self Care | Payer: Medicare Other | Attending: Emergency Medicine | Admitting: Emergency Medicine

## 2019-02-25 ENCOUNTER — Encounter (HOSPITAL_COMMUNITY): Payer: Self-pay | Admitting: Emergency Medicine

## 2019-02-25 DIAGNOSIS — Z79899 Other long term (current) drug therapy: Secondary | ICD-10-CM | POA: Diagnosis not present

## 2019-02-25 DIAGNOSIS — I129 Hypertensive chronic kidney disease with stage 1 through stage 4 chronic kidney disease, or unspecified chronic kidney disease: Secondary | ICD-10-CM | POA: Insufficient documentation

## 2019-02-25 DIAGNOSIS — E1122 Type 2 diabetes mellitus with diabetic chronic kidney disease: Secondary | ICD-10-CM | POA: Insufficient documentation

## 2019-02-25 DIAGNOSIS — Z7982 Long term (current) use of aspirin: Secondary | ICD-10-CM | POA: Diagnosis not present

## 2019-02-25 DIAGNOSIS — F919 Conduct disorder, unspecified: Secondary | ICD-10-CM | POA: Diagnosis not present

## 2019-02-25 DIAGNOSIS — R4189 Other symptoms and signs involving cognitive functions and awareness: Secondary | ICD-10-CM

## 2019-02-25 DIAGNOSIS — F0390 Unspecified dementia without behavioral disturbance: Secondary | ICD-10-CM | POA: Insufficient documentation

## 2019-02-25 DIAGNOSIS — N189 Chronic kidney disease, unspecified: Secondary | ICD-10-CM | POA: Insufficient documentation

## 2019-02-25 LAB — COMPREHENSIVE METABOLIC PANEL
ALT: 17 U/L (ref 0–44)
AST: 19 U/L (ref 15–41)
Albumin: 4 g/dL (ref 3.5–5.0)
Alkaline Phosphatase: 88 U/L (ref 38–126)
Anion gap: 11 (ref 5–15)
BUN: 9 mg/dL (ref 8–23)
CO2: 23 mmol/L (ref 22–32)
Calcium: 9.1 mg/dL (ref 8.9–10.3)
Chloride: 109 mmol/L (ref 98–111)
Creatinine, Ser: 1.18 mg/dL — ABNORMAL HIGH (ref 0.44–1.00)
GFR calc Af Amer: 51 mL/min — ABNORMAL LOW (ref >60)
GFR calc non Af Amer: 44 mL/min — ABNORMAL LOW (ref >60)
Glucose, Bld: 108 mg/dL — ABNORMAL HIGH (ref 70–99)
Potassium: 3.4 mmol/L — ABNORMAL LOW (ref 3.5–5.1)
Sodium: 143 mmol/L (ref 135–145)
Total Bilirubin: 0.6 mg/dL (ref 0.3–1.2)
Total Protein: 7 g/dL (ref 6.5–8.1)

## 2019-02-25 LAB — ACETAMINOPHEN LEVEL: Acetaminophen (Tylenol), Serum: <10 ug/mL — ABNORMAL LOW (ref 10–30)

## 2019-02-25 LAB — ETHANOL: Alcohol, Ethyl (B): <10 mg/dL (ref <10)

## 2019-02-25 LAB — SALICYLATE LEVEL: Salicylate Lvl: <7 mg/dL (ref 2.8–30.0)

## 2019-02-25 MED ORDER — ASPIRIN EC 325 MG PO TBEC
325.0000 mg | DELAYED_RELEASE_TABLET | Freq: Every day | ORAL | Status: DC
  Administered 2019-02-26: 325 mg via ORAL
  Filled 2019-02-25 (×2): qty 1

## 2019-02-25 MED ORDER — AMLODIPINE BESYLATE 5 MG PO TABS
10.0000 mg | ORAL_TABLET | Freq: Every day | ORAL | Status: DC
  Administered 2019-02-25 – 2019-02-26 (×2): 10 mg via ORAL
  Filled 2019-02-25 (×2): qty 2

## 2019-02-25 MED ORDER — OMEGA-3-ACID ETHYL ESTERS 1 G PO CAPS
1.0000 g | ORAL_CAPSULE | Freq: Every day | ORAL | Status: DC
  Administered 2019-02-26: 1 g via ORAL
  Filled 2019-02-25 (×2): qty 1

## 2019-02-25 MED ORDER — GABAPENTIN 100 MG PO CAPS
200.0000 mg | ORAL_CAPSULE | Freq: Every day | ORAL | Status: DC
  Administered 2019-02-25: 200 mg via ORAL
  Filled 2019-02-25: qty 2

## 2019-02-25 MED ORDER — ROSUVASTATIN CALCIUM 5 MG PO TABS
5.0000 mg | ORAL_TABLET | Freq: Every day | ORAL | Status: DC
  Filled 2019-02-25: qty 1

## 2019-02-25 MED ORDER — MIRTAZAPINE 7.5 MG PO TABS
15.0000 mg | ORAL_TABLET | Freq: Every day | ORAL | Status: DC
  Administered 2019-02-25: 15 mg via ORAL
  Filled 2019-02-25: qty 2

## 2019-02-25 MED ORDER — TRAMADOL HCL 50 MG PO TABS: 50.0000 mg | ORAL_TABLET | Freq: Four times a day (QID) | ORAL | Status: DC | PRN

## 2019-02-25 NOTE — ED Provider Notes (Signed)
[facility] DEPT Provider Note   CSN: [id number] Arrival date & time: 02/25/19  2201     History   Chief Complaint Chief Complaint  Patient presents with  . IVC  . Medical Clearance    HPI Sherri [name] is a 78 y.o. female with a past medical history of advanced dementia and aggressive behavior.  Patient was placed under involuntary commitment by her husband because she is not taking her medications or wearing her adult diapers and he states in his IVC paperwork that he cannot take care of her.  She has been seen here in the emergency department for behavioral issues in the past.  She is currently calm and collected.  Review of EMR shows that she has had significant decline in her mentation and it appears that her PCP has been trying to order home health for her.Marland Kitchen     HPI  Past Medical History:  Diagnosis Date  . Diabetes mellitus without complication ([place])   . Hyperlipidemia   . Hypertension   . Stroke [facility])     Patient Active Problem List   Diagnosis Date Noted  . Vomiting 01/25/2018  . Hypokalemia 11/24/2016  . History of CVA with residual deficit 11/24/2016  . History of TIA (transient ischemic attack)   . CKD (chronic kidney disease)   . Acute CVA (cerebrovascular accident) ([place]) 08/03/2015  . CVA (cerebral infarction) 08/03/2015  . Diabetes mellitus type 2 in nonobese ([place]) 08/03/2015  . Dizziness   . Encounter for medication counseling 07/03/2015  . Adjustment disorder with mixed anxiety and depressed mood 09/16/2014  . Hyperlipidemia   . Hypertension     Past Surgical History:  Procedure Laterality Date  . EP IMPLANTABLE DEVICE N/A 08/06/2015   Procedure: Loop Recorder Insertion;  Surgeon: Thompson Grayer, MD;  Location: [place] CV LAB;  Service: Cardiovascular;  Laterality: N/A;  . TEE WITHOUT CARDIOVERSION N/A 08/06/2015   Procedure: TRANSESOPHAGEAL ECHOCARDIOGRAM (TEE);  Surgeon: Dorothy Spark, MD;  Location: [facility]  ENDOSCOPY;  Service: Cardiovascular;  Laterality: N/A;     OB History   No obstetric history on file.      Home Medications    Prior to Admission medications   Medication Sig Start Date End Date Taking? Authorizing Provider  amLODipine (NORVASC) 10 MG tablet TAKE 1 TABLET BY MOUTH EVERY DAY 05/13/18   Lucille Passy, MD  aspirin EC 325 MG EC tablet Take 1 tablet (325 mg total) by mouth daily. 08/07/15   Velvet Bathe, MD  gabapentin (NEURONTIN) 100 MG capsule Take 200 mg by mouth at bedtime.    [provider]  Glucosamine-Chondroit-Vit C-Mn (GLUCOSAMINE 1500 COMPLEX) CAPS Take 1 capsule by mouth daily.     [provider]  mirtazapine (REMERON) 15 MG tablet Take 1qhs (plz sched phone visit) 01/25/19   Lucille Passy, MD  omega-3 acid ethyl esters (LOVAZA) 1 g capsule Take 1 g by mouth daily.    [provider]  rosuvastatin (CRESTOR) 5 MG tablet Take 1qd(needs fasting labs and virtual visit) 01/19/19   Lucille Passy, MD  traMADol (ULTRAM) 50 MG tablet Take 50 mg by mouth every 6 (six) hours as needed for moderate pain.     [provider]    Family History Family History  Problem Relation Age of Onset  . Hypertension Mother   . Hypertension Father     Social History Social History   Tobacco Use  . Smoking status: Never Smoker  .  Smokeless tobacco: Never Used  Substance Use Topics  . Alcohol use: No  . Drug use: No     Allergies   Hydrogen peroxide and Sulfa antibiotics   Review of Systems Review of Systems  Unable to perform ROS: Dementia     Physical Exam Updated Vital Signs BP (!) 161/107 (BP Location: Right Arm)   Pulse 92   Temp 98.3 F (36.8 C) (Oral)   Resp 18   Ht 5\' 6"  (1.676 m)   Wt 83.9 kg   SpO2 98%   BMI 29.86 kg/m   Physical Exam Vitals signs and nursing note reviewed.  Constitutional:      General: She is not in acute distress.    Appearance: She is well-developed. She is not diaphoretic.     Comments:  Patient sleeping comfortably   HENT:     Head: Normocephalic and atraumatic.  Eyes:     General: No scleral icterus.    Conjunctiva/sclera: Conjunctivae normal.  Neck:     Musculoskeletal: Normal range of motion.  Cardiovascular:     Rate and Rhythm: Normal rate and regular rhythm.     Heart sounds: Normal heart sounds. No murmur. No friction rub. No gallop.   Pulmonary:     Effort: Pulmonary effort is normal. No respiratory distress.     Breath sounds: Normal breath sounds.  Abdominal:     General: Bowel sounds are normal. There is no distension.     Palpations: Abdomen is soft. There is no mass.     Tenderness: There is no abdominal tenderness. There is no guarding.  Skin:    General: Skin is warm and dry.  Neurological:     Mental Status: She is alert.  Psychiatric:        Behavior: Behavior normal.      ED Treatments / Results  Labs (all labs ordered are listed, but only abnormal results are displayed) Labs Reviewed  COMPREHENSIVE METABOLIC PANEL  ETHANOL  SALICYLATE LEVEL  ACETAMINOPHEN LEVEL  CBC  RAPID URINE DRUG SCREEN, HOSP PERFORMED  URINALYSIS, ROUTINE W REFLEX MICROSCOPIC    EKG None  Radiology No results found.  Procedures Procedures (including critical care time)  Medications Ordered in ED Medications - No data to display   Initial Impression / Assessment and Plan / ED Course  I have reviewed the triage vital signs and the nursing notes.  Pertinent labs & imaging results that were available during my care of the patient were reviewed by me and considered in my medical decision making (see chart for details).        Patient brought in under IVC by her husband.  Patient's urine and CMP are pending.  CBC without significant abnormality.  Alcohol, Tylenol and salicylate levels within normal limits.  I given signout PA up still and Dr. Elesa MassedWard.  Final Clinical Impressions(s) / ED Diagnoses   Final diagnoses:  None    ED Discharge Orders     None       Arthor CaptainHarris, Arelia Volpe, PA-C 02/26/19 1614    Ward, Layla MawKristen N, DO 02/27/19 [phone]

## 2019-02-25 NOTE — ED Triage Notes (Signed)
Pt presents by [facility] under IVC by spouse due to pt having dementia and not taking medications or providing appropriate hygiene to self. Pt currently denies any pain or SI/HI. No acute distress at this time.

## 2019-02-26 DIAGNOSIS — R4189 Other symptoms and signs involving cognitive functions and awareness: Secondary | ICD-10-CM

## 2019-02-26 LAB — URINALYSIS, ROUTINE W REFLEX MICROSCOPIC
Bilirubin Urine: NEGATIVE
Glucose, UA: NEGATIVE mg/dL
Hgb urine dipstick: NEGATIVE
Ketones, ur: NEGATIVE mg/dL
Nitrite: NEGATIVE
Protein, ur: 100 mg/dL — AB
Specific Gravity, Urine: 1.006 (ref 1.005–1.030)
pH: 7 (ref 5.0–8.0)

## 2019-02-26 LAB — CBC
HCT: 45.2 % (ref 36.0–46.0)
Hemoglobin: 13.2 g/dL (ref 12.0–15.0)
MCH: 28.1 pg (ref 26.0–34.0)
MCHC: 29.2 g/dL — ABNORMAL LOW (ref 30.0–36.0)
MCV: 96.4 fL (ref 80.0–100.0)
Platelets: 131 10*3/uL — ABNORMAL LOW (ref 150–400)
RBC: 4.69 MIL/uL (ref 3.87–5.11)
RDW: 14.6 % (ref 11.5–15.5)
WBC: 6.5 10*3/uL (ref 4.0–10.5)
nRBC: 0 % (ref 0.0–0.2)

## 2019-02-26 LAB — RAPID URINE DRUG SCREEN, HOSP PERFORMED
Amphetamines: NOT DETECTED
Barbiturates: NOT DETECTED
Benzodiazepines: NOT DETECTED
Cocaine: NOT DETECTED
Opiates: NOT DETECTED
Tetrahydrocannabinol: NOT DETECTED

## 2019-02-26 NOTE — [facility] Counselor (Signed)
Clinician called the TTS cart however no answer.    Vertell Novak, [place], [facility], [facility] Triage Specialist [phone]

## 2019-02-26 NOTE — Progress Notes (Addendum)
3:30p CSW has been in contact with patient's husband throughout the afternoon. Per patient's husband he may have to be admitted into the hospital for his own medical issues and will not be able to care for patient because of this. Patient's husband and patient's son were both present outside of the ED to pick patient up. CSW spoke with patient's son Sherlon Handing who reports patient will be cared for by his brother Gerhard Munch or uncle Jeneen Rinks while her husband takes care of his medical needs. CSW contacted patient's son Gerhard Munch (listed on emergency contact list) via phone and Monte stated that patient can be brought to his home to care for patient. CSW spoke to [employer] about this and he agreed.  12:30p CSW called patient's husband and informed him that patient is ready for pick up. Patient's husband states their son, Sherlon Handing will be picking up patient.  11:30a CSW received consult for patient as well as phone call from Tonette Bihari with disposition stating patient has been psych cleared and her symptoms are more of a "neurological problem." CSW contact patient's husband, Yola Paradiso, to inform him of the disposition and that she will be ready for pick up once her IVC has been rescinded and up for discharge. Mr. Mccampbell expressed some concerns with taking care of patient with the change in her behavior. He states that he has 2 sons who live in [place], a daughter in a city city in [place], and another son in a different state. He reports he does not have much help from them. Mr. Anastos also reports patient just returned to live with him after a few years. Patient was living with her brother in [place] and patient's brother dropped patient off last week. Mr. Pandolfi is still trying to get a understanding of what medical treatment she had in [place] from patient's brother. CSW recommended to Mr. Bodie that he have patient follow up with her PCP and speak about her getting a neurological evaluation (patient  does not have a diagnosis for dementia, was dx with cognitive decline in the ED today). CSW also recommended looking into ALF and explained this will be an out of pocket cost due to patient's insurance. Mr. Terwilliger stated he will be here to pick up patient after he runs some errands. He asked that he be contacted when patient is ready.  CSW signing off.   Golden Circle, LCSW Transitions of Care Department [facility] ED [phone]

## 2019-02-26 NOTE — ED Provider Notes (Signed)
IVC'd by husband for behavior difficulty (not wearing her diaper, or taking her medications. Pending TTS evaluation to determine psych vs dementia.   Per TTS consultation, collateral information unable to be obtained. Husband is not answering his phone. Will keep in the ED until further information can be obtained and decision made as to disposition.   Charlann Lange, PA-C 02/26/19 Hosmer, Delice Bison, DO 02/26/19 [phone]

## 2019-02-26 NOTE — ED Notes (Signed)
Pt off unit to home per MD. Pt alert, calm, cooperative, confused, no s/s of distress. DC information given to family. Belongings given to pt. Pt unsteady, off the unit in w/c, escorted by RN and SW. Pt transported by son.

## 2019-02-26 NOTE — BH Assessment (Addendum)
Tele Assessment Note   Patient Name: Sherri [name] MRN: [id number] Referring Physician: Elpidio Anis, PA-C. Location of Patient: [facility] ED, [phone].  Location of Provider: Behavioral Health TTS Department  [name] is an 78 y.o. female, who presents involuntary and unaccompanied to [facility]. Clinician asked the pt, "what brought you to the hospital?" Pt reported, "police brought me here." Clinician asked the pt "why did the police bring you to the hospital?" Pt reported, umm...oh...I yes well...my  husband sent me to be evaluated." Clinician asked the pt, "why does your husband want you to be evaluated?" Pt reported, she doesn't know. Pt denies, stressors. Pt denies, SI, HI, AVH, self-injurious behaviors and access to weapons.  Pt was IVC'd by her husband Marilu Favre Tab, [phone]). Per IVC paperwork: "Respondent has been diagnosed with Dementia and does not take medications. She is supposed to wear diapers and does not. She does not take baths or brush her teeth. Believes her husband is poisoning her." Pt reported, she needs help bathing, she wears underware and is not prescribed any medications.   Pt denies, abuse and substance use. Pt's UDS is pending. Pt denies. Being linked to OPT resources (medication management and/or counseling.) Pt denies, previous inpatient admissions.  Pt presents quiet, awake in hospital gown. Pt's eye contact was poor. Pt's mood was pleasant. Pt's affect was flat. Pt's thought process was coherent, relevant. Pt's judgement was impaired. Pt was oriented x3. Pt's concentration was fair. Pt's insight and impulse control was poor. Pt reported, if discharged from [facility] she could contract for safety.   Diagnosis: Deferred.  Past Medical History:  Past Medical History:  Diagnosis Date  . Diabetes mellitus without complication (HCC)   . Hyperlipidemia   . Hypertension   . Stroke [facility])     Past Surgical History:  Procedure Laterality Date  . EP  IMPLANTABLE DEVICE N/A 08/06/2015   Procedure: Loop Recorder Insertion;  Surgeon: Hillis Range, MD;  Location: [facility] INVASIVE CV LAB;  Service: Cardiovascular;  Laterality: N/A;  . TEE WITHOUT CARDIOVERSION N/A 08/06/2015   Procedure: TRANSESOPHAGEAL ECHOCARDIOGRAM (TEE);  Surgeon: Lars Masson, MD;  Location: [facility] ENDOSCOPY;  Service: Cardiovascular;  Laterality: N/A;    Family History:  Family History  Problem Relation Age of Onset  . Hypertension Mother   . Hypertension Father     Social History:  reports that she has never smoked. She has never used smokeless tobacco. She reports that she does not drink alcohol or use drugs.  Additional Social History:  Alcohol / Drug Use Pain Medications: See MAR Prescriptions: See MAR Over the Counter: See MAR  CIWA: CIWA-Ar BP: (!) 166/91 Pulse Rate: 79 COWS:    Allergies:  Allergies  Allergen Reactions  . Hydrogen Peroxide Swelling and Other (See Comments)    Reaction:  Localized swelling  . Sulfa Antibiotics Other (See Comments)    Reaction:  Unknown     Home Medications: (Not in a hospital admission)   OB/GYN Status:  No LMP recorded. Patient is postmenopausal.  General Assessment Data Location of Assessment: [facility] ED TTS Assessment: In system Is this a Tele or Face-to-Face Assessment?: Tele Assessment Is this an Initial Assessment or a Re-assessment for this encounter?: Initial Assessment Patient Accompanied by:: N/A Language Other than English: No Living Arrangements: Other (Comment)(Husband and kids. ) What gender do you identify as?: Female Marital status: Married Living Arrangements: Spouse/significant other, Children Can pt return to current living arrangement?: Yes Admission Status: Involuntary Petitioner: Family member(Husband.) Is  patient capable of signing voluntary admission?: No Referral Source: Self/Family/Friend Insurance type: [employer] Medicare.      Crisis Care Plan Living Arrangements:  Spouse/significant other, Children Legal Guardian: Other:(Self. ) Name of Psychiatrist: NA Name of Therapist: NA  Education Status Is patient currently in school?: No Is the patient employed, unemployed or receiving disability?: Employed(Per pt she works at, [employer] )  Risk to self with the past 6 months Suicidal Ideation: No(Pt denies. ) Has patient been a risk to self within the past 6 months prior to admission? : No(Pt denies. ) Suicidal Intent: No Has patient had any suicidal intent within the past 6 months prior to admission? : No(Pt denies. ) Is patient at risk for suicide?: No Suicidal Plan?: No(Pt denies. ) Has patient had any suicidal plan within the past 6 months prior to admission? : No Access to Means: No What has been your use of drugs/alcohol within the last 12 months?: Pt denies. UDS is pending.  Previous Attempts/Gestures: No(Pt denies. ) How many times?: 0 Other Self Harm Risks: NA Triggers for Past Attempts: None known Intentional Self Injurious Behavior: None(Pt denies. ) Family Suicide History: No Recent stressful life event(s): (Pt denies, stressors.) Persecutory voices/beliefs?: No Depression: No(Pt denies. ) Depression Symptoms: (Pt denies. ) Substance abuse history and/or treatment for substance abuse?: No(Pt denies. ) Suicide prevention information given to non-admitted patients: Not applicable  Risk to Others within the past 6 months Homicidal Ideation: No(Pt denies. ) Does patient have any lifetime risk of violence toward others beyond the six months prior to admission? : No(Pt denies. ) Thoughts of Harm to Others: No(Pt denies. ) Current Homicidal Intent: No Current Homicidal Plan: No Access to Homicidal Means: No(Pt denies. ) Identified Victim: NA History of harm to others?: No(Pt denies. ) Assessment of Violence: None Noted Violent Behavior Description: NA Does patient have access to weapons?: No(Pt denies. ) Criminal  Charges Pending?: No Does patient have a court date: No Is patient on probation?: No  Psychosis Hallucinations: None noted(Pt denies. ) Delusions: Unspecified(Per IVC, "believes her husband is poisoning her." )  Mental Status Report Appearance/Hygiene: In hospital gown Eye Contact: Poor Motor Activity: Unremarkable Speech: Slow Level of Consciousness: Quiet/awake Affect: Flat Anxiety Level: None Thought Processes: Coherent, Relevant Judgement: Impaired Orientation: Person, Place, Time Obsessive Compulsive Thoughts/Behaviors: None  Cognitive Functioning Concentration: Fair Memory: Recent Impaired Is patient IDD: No Insight: Poor Impulse Control: Poor Appetite: Good Have you had any weight changes? : No Change Sleep: No Change Total Hours of Sleep: 8 Vegetative Symptoms: None  ADLScreening [facility] Assessment Services) Patient's cognitive ability adequate to safely complete daily activities?: Yes Patient able to express need for assistance with ADLs?: Yes Independently performs ADLs?: No  Prior Inpatient Therapy Prior Inpatient Therapy: No  Prior Outpatient Therapy Prior Outpatient Therapy: No Does patient have an ACCT team?: No Does patient have Intensive In-House Services?  : No Does patient have [facility] services? : No Does patient have P4CC services?: No  ADL Screening (condition at time of admission) Patient's cognitive ability adequate to safely complete daily activities?: Yes Is the patient deaf or have difficulty hearing?: No Does the patient have difficulty seeing, even when wearing glasses/contacts?: No(Pt denies.) Does the patient have difficulty concentrating, remembering, or making decisions?: Yes Patient able to express need for assistance with ADLs?: Yes Does the patient have difficulty dressing or bathing?: Yes Independently performs ADLs?: No Bathing: Needs assistance Is this a change from baseline?: Pre-admission baseline Walks in  Home: Needs  assistance Does the patient have difficulty walking or climbing stairs?: Yes(Pt reported, using a cane.) Weakness of Legs: None(Pt denies.) Weakness of Arms/Hands: None(Pt denies.)  Home Assistive Devices/Equipment Home Assistive Devices/Equipment: Cane (specify quad or straight)    Abuse/Neglect Assessment (Assessment to be complete while patient is alone) Abuse/Neglect Assessment Can Be Completed: Yes Physical Abuse: Denies(Pt denies.) Verbal Abuse: Denies(Pt denies.) Sexual Abuse: Denies(Pt denies.) Exploitation of patient/patient's resources: Denies(Pt denies.) Self-Neglect: Denies(Pt denies.)     Advance Directives (For Healthcare) Does Patient Have a Medical Advance Directive?: No          Disposition: Per Lindon Romp, NP disposition pending until additional collateral is received. Disposition discussed with Nehemiah Settle, PA.    Disposition Initial Assessment Completed for this Encounter: Yes  This service was provided via telemedicine using a 2-way, interactive audio and video technology.  Names of all persons participating in this telemedicine service and their role in this encounter. Name: KEIVA DINA. Role: Patient.   Name: Vertell Novak, MS, [facility], [place]. Role: Counselor.           Vertell Novak 02/26/2019 2:07 AM    Vertell Novak, [place], [facility], [place] Triage Specialist [phone]

## 2019-02-26 NOTE — BHH Suicide Risk Assessment (Cosign Needed Addendum)
Suicide Risk Assessment  Discharge Assessment   [facility] Discharge Suicide Risk Assessment   Principal Problem: Cognitive decline Discharge Diagnoses: Principal Problem:   Cognitive decline   Total Time spent with patient: 30 minutes  Sherri [name] is 78 year old female. Disoriented to time, place and situation today. Patient states "I am in [place]." Patient denies SI, HI and AVH. Patient denies history of mental illness and denies history of self-harm.  Patient's husband, Sherri [name], at bedside, reports "she has memory loss, she lived with her brother in [place] for the last two yeas-just returned last Friday, she won't take her medication and she believes I stole her ring and someone else is in the home." Patient lives alone with husband who has had home health care in the past, discontinued when the patient "refused to cooperate."    Musculoskeletal: Strength & Muscle Tone: unable to assess Gait & Station: unable to assess Patient leans: unable to assess  Psychiatric Specialty Exam:   Blood pressure 137/84, pulse 70, temperature 98.3 F (36.8 C), temperature source Oral, resp. rate (!) 27, height 5\' 6"  (1.676 m), weight 83.9 kg, SpO2 97 %.Body mass index is 29.86 kg/m.  General Appearance: Casual  Eye Contact::  Fair  Speech:  Clear and [id number]  Volume:  Normal  Mood:  Euthymic  Affect:  Blunt  Thought Process:  Coherent  Orientation:  Negative  Thought Content:  Logical  Suicidal Thoughts:  No  Homicidal Thoughts:  No  Memory:  Immediate;   Poor Recent;   Poor Remote;   Poor  Judgement:  Impaired  Insight:  Lacking  Psychomotor Activity:  Normal  Concentration:  Fair  Recall:  Poor  Fund of Knowledge:Fair  Language: Fair  Akathisia:  No  Handed:  Right  AIMS (if indicated):     Assets:  Agricultural consultant Housing Social Support  Sleep:     Cognition: Impaired,  Moderate  ADL's:  Impaired   Mental Status Per  Nursing Assessment::   On Admission:     Demographic Factors:  Age 75 or older  Loss Factors: Decline in physical health  Historical Factors: NA  Risk Reduction Factors:   Sense of responsibility to family, Living with another person, especially a relative, Positive social support and Positive therapeutic relationship  Continued Clinical Symptoms:  NA  Cognitive Features That Contribute To Risk:  Loss of executive function    Suicide Risk:  Minimal: No identifiable suicidal ideation.  Patients presenting with no risk factors but with morbid ruminations; may be classified as minimal risk based on the severity of the depressive symptoms    Plan Of Care/Follow-up recommendations:  Other:  Patient cleared by psychiatry.   Emmaline Kluver, FNP 02/26/2019, 11:40 AM

## 2019-02-26 NOTE — BHH Counselor (Signed)
Clinician attempted to contact pt's husband/IVC petitioner Braulio Conte [place], [phone]) to gather collateral information. Clinician left a HIPPA compliant voice message with call back information.    Vertell Novak, [place], [facility], [facility] Triage Specialist [phone]

## 2019-02-26 NOTE — BH Assessment (Signed)
[facility] Assessment Progress Note  Per Buford Dresser, DO, this pt does not require psychiatric hospitalization at this time.  Pt is psychiatrically cleared.  Pt presents under IVC initiated by pt's husband, which Dr Mariea Clonts will rescind.  No behavioral health referrals are indicated for this pt at this time.  Dr Mariea Clonts will be ordering a social work consult for this pt.  Golden Circle, CSW has been notified.  Pt's nurse, Eustaquio Maize, has been notified.  Jalene Mullet, [place] Triage Specialist [phone]

## 2019-02-26 NOTE — ED Provider Notes (Signed)
Pt is here for issues with dementia, not caring for herself.  Assessed by TTS early this am.  Plan is reassessment and obtaining additional information this am.  Labs reviewed.  No significant abnormalities noted.  Home meds have been ordered.  Vitals stable this am.   Dorie Rank, MD 02/26/19 [phone]

## 2019-03-10 ENCOUNTER — Emergency Department (HOSPITAL_COMMUNITY): Payer: Medicare Other

## 2019-03-10 ENCOUNTER — Emergency Department (HOSPITAL_COMMUNITY)
Admission: EM | Admit: 2019-03-10 | Discharge: 2019-03-11 | Disposition: A | Discharge status: Home / Self Care | Payer: Medicare Other | Attending: Emergency Medicine | Admitting: Emergency Medicine

## 2019-03-10 ENCOUNTER — Other Ambulatory Visit: Payer: Self-pay

## 2019-03-10 DIAGNOSIS — R109 Unspecified abdominal pain: Secondary | ICD-10-CM | POA: Diagnosis present

## 2019-03-10 DIAGNOSIS — E1122 Type 2 diabetes mellitus with diabetic chronic kidney disease: Secondary | ICD-10-CM | POA: Diagnosis not present

## 2019-03-10 DIAGNOSIS — G309 Alzheimer's disease, unspecified: Secondary | ICD-10-CM | POA: Insufficient documentation

## 2019-03-10 DIAGNOSIS — F0280 Dementia in other diseases classified elsewhere without behavioral disturbance: Secondary | ICD-10-CM | POA: Insufficient documentation

## 2019-03-10 DIAGNOSIS — Z8673 Personal history of transient ischemic attack (TIA), and cerebral infarction without residual deficits: Secondary | ICD-10-CM | POA: Diagnosis not present

## 2019-03-10 DIAGNOSIS — N189 Chronic kidney disease, unspecified: Secondary | ICD-10-CM | POA: Insufficient documentation

## 2019-03-10 DIAGNOSIS — Z79899 Other long term (current) drug therapy: Secondary | ICD-10-CM | POA: Diagnosis not present

## 2019-03-10 DIAGNOSIS — I129 Hypertensive chronic kidney disease with stage 1 through stage 4 chronic kidney disease, or unspecified chronic kidney disease: Secondary | ICD-10-CM | POA: Diagnosis not present

## 2019-03-10 DIAGNOSIS — R112 Nausea with vomiting, unspecified: Secondary | ICD-10-CM

## 2019-03-10 DIAGNOSIS — Z7982 Long term (current) use of aspirin: Secondary | ICD-10-CM | POA: Insufficient documentation

## 2019-03-10 LAB — COMPREHENSIVE METABOLIC PANEL
ALT: 14 U/L (ref 0–44)
AST: 18 U/L (ref 15–41)
Albumin: 4 g/dL (ref 3.5–5.0)
Alkaline Phosphatase: 102 U/L (ref 38–126)
Anion gap: 13 (ref 5–15)
BUN: 14 mg/dL (ref 8–23)
CO2: 23 mmol/L (ref 22–32)
Calcium: 9.3 mg/dL (ref 8.9–10.3)
Chloride: 107 mmol/L (ref 98–111)
Creatinine, Ser: 1.35 mg/dL — ABNORMAL HIGH (ref 0.44–1.00)
GFR calc Af Amer: 43 mL/min — ABNORMAL LOW (ref >60)
GFR calc non Af Amer: 37 mL/min — ABNORMAL LOW (ref >60)
Glucose, Bld: 114 mg/dL — ABNORMAL HIGH (ref 70–99)
Potassium: 3.9 mmol/L (ref 3.5–5.1)
Sodium: 143 mmol/L (ref 135–145)
Total Bilirubin: 0.4 mg/dL (ref 0.3–1.2)
Total Protein: 7.5 g/dL (ref 6.5–8.1)

## 2019-03-10 LAB — CBC WITH DIFFERENTIAL/PLATELET
Abs Immature Granulocytes: 0.04 10*3/uL (ref 0.00–0.07)
Basophils Absolute: 0 10*3/uL (ref 0.0–0.1)
Basophils Relative: 0 %
Eosinophils Absolute: 0 10*3/uL (ref 0.0–0.5)
Eosinophils Relative: 1 %
HCT: 46.2 % — ABNORMAL HIGH (ref 36.0–46.0)
Hemoglobin: 14.3 g/dL (ref 12.0–15.0)
Immature Granulocytes: 1 %
Lymphocytes Relative: 16 %
Lymphs Abs: 1.3 10*3/uL (ref 0.7–4.0)
MCH: 29.4 pg (ref 26.0–34.0)
MCHC: 31 g/dL (ref 30.0–36.0)
MCV: 95.1 fL (ref 80.0–100.0)
Monocytes Absolute: 0.4 10*3/uL (ref 0.1–1.0)
Monocytes Relative: 5 %
Neutro Abs: 6.7 10*3/uL (ref 1.7–7.7)
Neutrophils Relative %: 77 %
Platelets: 175 10*3/uL (ref 150–400)
RBC: 4.86 MIL/uL (ref 3.87–5.11)
RDW: 14.3 % (ref 11.5–15.5)
WBC: 8.5 10*3/uL (ref 4.0–10.5)
nRBC: 0 % (ref 0.0–0.2)

## 2019-03-10 LAB — LIPASE, BLOOD: Lipase: 25 U/L (ref 11–51)

## 2019-03-10 MED ORDER — ONDANSETRON HCL 4 MG/2ML IJ SOLN
4.0000 mg | Freq: Once | INTRAMUSCULAR | Status: AC
  Administered 2019-03-10: 17:00:00 4 mg via INTRAVENOUS
  Filled 2019-03-10: qty 2

## 2019-03-10 MED ORDER — ROSUVASTATIN CALCIUM 5 MG PO TABS
5.0000 mg | ORAL_TABLET | Freq: Every day | ORAL | Status: DC
  Administered 2019-03-11: 05:00:00 5 mg via ORAL
  Filled 2019-03-10: qty 1

## 2019-03-10 MED ORDER — OMEGA-3-ACID ETHYL ESTERS 1 G PO CAPS
1.0000 g | ORAL_CAPSULE | Freq: Every day | ORAL | Status: DC
  Administered 2019-03-11: 1 g via ORAL
  Filled 2019-03-10 (×3): qty 1

## 2019-03-10 MED ORDER — MIRTAZAPINE 15 MG PO TABS
15.0000 mg | ORAL_TABLET | Freq: Every day | ORAL | Status: DC
  Administered 2019-03-11: 15 mg via ORAL
  Filled 2019-03-10 (×3): qty 1

## 2019-03-10 MED ORDER — IOHEXOL 300 MG/ML  SOLN
100.0000 mL | Freq: Once | INTRAMUSCULAR | Status: AC | PRN
  Administered 2019-03-10: 20:00:00 80 mL via INTRAVENOUS

## 2019-03-10 MED ORDER — GLUCOSAMINE 1500 COMPLEX PO CAPS: 1.0000 | ORAL_CAPSULE | Freq: Every day | ORAL | Status: DC

## 2019-03-10 MED ORDER — FENTANYL CITRATE (PF) 100 MCG/2ML IJ SOLN
25.0000 ug | Freq: Once | INTRAMUSCULAR | Status: AC
  Administered 2019-03-10: 25 ug via INTRAVENOUS
  Filled 2019-03-10: qty 2

## 2019-03-10 MED ORDER — GABAPENTIN 100 MG PO CAPS
200.0000 mg | ORAL_CAPSULE | Freq: Every evening | ORAL | Status: DC | PRN
  Administered 2019-03-11: 200 mg via ORAL
  Filled 2019-03-10: qty 2
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
## 2019-03-10 NOTE — ED Triage Notes (Addendum)
Brought in by EMS. Husband called reporting pt had been complaining of abdominal pain, nausea and vomiting all day. Pt has history of mild dementia.
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
## 2019-03-10 NOTE — ED Notes (Signed)
Patient transported to CT 

## 2019-03-10 NOTE — ED Notes (Signed)
Husband Braulio Conte [phone]

## 2019-03-11 LAB — CBG MONITORING, ED: Glucose-Capillary: 100 mg/dL — ABNORMAL HIGH (ref 70–99)
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
## 2019-03-11 NOTE — ED Notes (Signed)
Patient verbalizes understanding of discharge instructions. Opportunity for questioning and answers were provided with husband via phone. Armband removed by staff, pt discharged from ED with husband and belongings.

## 2019-03-11 NOTE — Progress Notes (Signed)
CSW received consult for family supports. CSW met with patient and assessed for family supports concerns. CSW notes patient reports that she would like to return to her brother's in [place] (brother is in [place]). Patient was receptive to discussion of family. CSW discussed community resources and options for support. CSW left two voicemails on brother's phone, received a brief call back but immediately hung up, CSW called back and was sent to V/M.   Daphine Deutscher, LCSW, [place] Social Worker II Emergency Department, [place], [place] [phone]

## 2019-03-11 NOTE — ED Notes (Signed)
Pt remains agitated. Slightly redirectable with consistent agitation. Bed placed in lowest position, yellow socks placed on pt and yellow bracelet. Pt remains in bed and covered in warm blanket.

## 2019-03-11 NOTE — ED Notes (Signed)
Notified PA that patient is okay to be discharged with her husband, per social work.

## 2019-03-11 NOTE — ED Notes (Signed)
Pt becoming slightly agitated, keeps asking when she can leave. Pt attempting to get out of bed. At this time easily redirectable to lay back down and stay.

## 2019-03-11 NOTE — ED Notes (Signed)
Breakfast ordered 

## 2019-03-11 NOTE — ED Provider Notes (Signed)
Resting comfortably overnight.    Plan remains unchanged.  Morning assessment by Social Work and/or Case Management.  Notified oncoming team.   Montine Circle, PA-C 03/11/19 7939    Ezequiel Essex, MD 03/11/19 [phone]
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
## 2019-03-11 NOTE — ED Notes (Signed)
PT is very agitated this morning. She is screaming for help and asking when she can go home

## 2019-03-12 DIAGNOSIS — E119 Type 2 diabetes mellitus without complications: Secondary | ICD-10-CM | POA: Insufficient documentation

## 2019-03-12 DIAGNOSIS — Z09 Encounter for follow-up examination after completed treatment for conditions other than malignant neoplasm: Secondary | ICD-10-CM | POA: Insufficient documentation

## 2019-03-12 NOTE — Progress Notes (Unsigned)
Subjective:   Patient ID: Sherri [name], female    DOB: [birth year], 78 y.o.   MRN: [id number]  Sherri [name] is a pleasant 78 y.o. year old female who presents to clinic today with No chief complaint on file.  on 03/13/2019  HPI:  Here for ED follow up.  Chart reviewed extensively.    She has a PMH significant for progressive dementia.  She was brought to the ED by her brother for nausea and vomiting (intractable) with some diarrhea.  She denied abdominal pain by the time she was evaluated by ED providers.  Per ED provider note, her abdomen was soft and non tender on exam.  Also was not distended and therefore there was a  low suspicion for diverticulitis, bowel obstruction, or acute surgical abdomen.    Given her age, they did obtain a CT scan to evaluate for other emergent pathology- reviewed- showed no acute abnormality.  She does have diverticulosis without any indication of diverticulitis, bowel obstruction or acute bowel inflammation.    Her blood work is fairly unremarkable- she did show worsening acute on chronic renal failure.  She had no leukocytosis or fever.    Ct Abdomen Pelvis W Contrast  Result Date: 03/10/2019 CLINICAL DATA:  Acute epigastric abdominal pain, nausea and vomiting. EXAM: CT ABDOMEN AND PELVIS WITH CONTRAST TECHNIQUE: Multidetector CT imaging of the abdomen and pelvis was performed using the standard protocol following bolus administration of intravenous contrast. CONTRAST:  79m OMNIPAQUE IOHEXOL 300 MG/ML  SOLN COMPARISON:  None. FINDINGS: Lower chest: No significant pulmonary nodules or acute consolidative airspace disease. Hepatobiliary: Normal liver size. No liver mass. Normal gallbladder with no radiopaque cholelithiasis. No biliary ductal dilatation. Small to moderate duodenal diverticulum arising superiorly from the transverse duodenum. Pancreas: Normal, with no mass or duct dilation. Spleen: Normal size. No mass. Adrenals/Urinary Tract: Normal  adrenals. No hydronephrosis. No renal masses. Normal bladder. Stomach/Bowel: Normal non-distended stomach. Normal caliber small bowel with no small bowel wall thickening. Normal appendix. Mild diffuse colonic diverticulosis, with no large bowel wall thickening or significant pericolonic fat stranding. Vascular/Lymphatic: Mildly atherosclerotic nonaneurysmal abdominal aorta. Patent portal, splenic and renal veins. No pathologically enlarged lymph nodes in the abdomen or pelvis. Reproductive: Grossly normal uterus.  No adnexal mass. Other: No pneumoperitoneum, ascites or focal fluid collection. Small fat containing umbilical hernia. Musculoskeletal: No aggressive appearing focal osseous lesions. Marked lumbar spondylosis. IMPRESSION: 1. No acute abnormality. No evidence of bowel obstruction or acute bowel inflammation. Normal appendix. Mild diffuse colonic diverticulosis, with no evidence of acute diverticulitis. 2.  Aortic Atherosclerosis (ICD10-I70.0). Electronically Signed   By: JIlona SorrelM.D.   On: 03/10/2019 20:24    Lab Results  Component Value Date   WBC 8.5 03/10/2019   HGB 14.3 03/10/2019   HCT 46.2 (H) 03/10/2019   MCV 95.1 03/10/2019   PLT 175 03/10/2019   Lab Results  Component Value Date   NA 143 03/10/2019   K 3.9 03/10/2019   CL 107 03/10/2019   CO2 23 03/10/2019   Lab Results  Component Value Date   CREATININE 1.35 (H) 03/10/2019   Lab Results  Component Value Date   LIPASE 25 03/10/2019    Please see PA provider's note regarding difficult family disposition issues.  There appears to be some disagreement among the patient's children and her brother regarding her care at home.  There is allegations made by the patient's brother who does not live with her that the patient's husband has been  78 giving her the wrong medications.  When asked the patient on exam whether her husband has ever mistreated her or struck her the patient denies this.  ED provider did suggest that she be  kept in ED overnight and have a Education officer, museum consulted the following morning.  the patient in the ER overnight and have a Education officer, museum consulted   She was seen by Lamonte Richer, LCSW on 03/11/19 at 11:22 am who left the following note:  "CSW received consult for family supports. CSW met with patient and assessed for family supports concerns. CSW notes patient reports that she would like to return to her brother's in [place] (brother is in [place]). Patient was receptive to discussion of family. CSW discussed community resources and options for support. CSW left two voicemails on brother's phone, received a brief call back but immediately hung up, CSW called back and was sent to V/M. "   Current Outpatient Medications on File Prior to Visit  Medication Sig Dispense Refill   amLODipine (NORVASC) 10 MG tablet TAKE 1 TABLET BY MOUTH EVERY DAY (Patient not taking: Reported on 02/26/2019) 90 tablet 1   aspirin EC 325 MG EC tablet Take 1 tablet (325 mg total) by mouth daily. (Patient not taking: Reported on 02/26/2019) 30 tablet 0   gabapentin (NEURONTIN) 100 MG capsule Take 200 mg by mouth at bedtime as needed (pain).   1   Glucosamine-Chondroit-Vit C-Mn (GLUCOSAMINE 1500 COMPLEX) CAPS Take 1 capsule by mouth daily.      mirtazapine (REMERON) 15 MG tablet Take 1qhs (plz sched phone visit) (Patient taking differently: Take 15 mg by mouth at bedtime. Take 1qhs (plz sched phone visit)) 90 tablet 1   omega-3 acid ethyl esters (LOVAZA) 1 g capsule Take 1 g by mouth daily.     rosuvastatin (CRESTOR) 5 MG tablet Take 1qd(needs fasting labs and virtual visit) (Patient taking differently: Take 5 mg by mouth daily. ) 90 tablet 0   traMADol (ULTRAM) 50 MG tablet Take 50 mg by mouth every 6 (six) hours as needed for moderate pain.      No current facility-administered medications on file prior to visit.     Allergies  Allergen Reactions   Hydrogen Peroxide Swelling and Other (See Comments)     Reaction:  Localized swelling   Sulfa Antibiotics Other (See Comments)    Reaction:  Unknown     Past Medical History:  Diagnosis Date   Diabetes mellitus without complication ([place])    Hyperlipidemia    Hypertension    Stroke [facility])     Past Surgical History:  Procedure Laterality Date   EP IMPLANTABLE DEVICE N/A 08/06/2015   Procedure: Loop Recorder Insertion;  Surgeon: Thompson Grayer, MD;  Location: [place] CV LAB;  Service: Cardiovascular;  Laterality: N/A;   TEE WITHOUT CARDIOVERSION N/A 08/06/2015   Procedure: TRANSESOPHAGEAL ECHOCARDIOGRAM (TEE);  Surgeon: Dorothy Spark, MD;  Location: [facility] ENDOSCOPY;  Service: Cardiovascular;  Laterality: N/A;    Family History  Problem Relation Age of Onset   Hypertension Mother    Hypertension Father     Social History   Socioeconomic History   Marital status: Married    Spouse name: Not on file   Number of children: Not on file   Years of education: Not on file   Highest education level: Not on file  Occupational History   Not on file  Social Needs   Financial resource strain: Not on file   Food insecurity  Worry: Not on file    Inability: Not on file   Transportation needs    Medical: Not on file    Non-medical: Not on file  Tobacco Use   Smoking status: Never Smoker   Smokeless tobacco: Never Used  Substance and Sexual Activity   Alcohol use: No   Drug use: No   Sexual activity: Never  Lifestyle   Physical activity    Days per week: Not on file    Minutes per session: Not on file   Stress: Not on file  Relationships   Social connections    Talks on phone: Not on file    Gets together: Not on file    Attends religious service: Not on file    Active member of club or organization: Not on file    Attends meetings of clubs or organizations: Not on file    Relationship status: Not on file   Intimate partner violence    Fear of current or ex partner: Not on file    Emotionally abused:  Not on file    Physically abused: Not on file    Forced sexual activity: Not on file  Other Topics Concern   Not on file  Social History Narrative   Desires CPR.   Would not want prolonged life support if futile   The PMH, PSH, Social History, Family History, Medications, and allergies have been reviewed in [facility], and have been updated if relevant.  Review of Systems     Objective:    There were no vitals taken for this visit.  Wt Readings from Last 3 Encounters:  03/10/19 184 lb 15.5 oz (83.9 kg)  02/25/19 185 lb (83.9 kg)  01/25/18 184 lb 6.4 oz (83.6 kg)    Physical Exam        Assessment & Plan:   Encounter for examination following treatment at hospital  Intractable vomiting with nausea, unspecified vomiting type - Plan: Comprehensive metabolic panel  Stage 4 chronic kidney disease ([place]) - Plan: Comprehensive metabolic panel, CBC with Differential/Platelet, Vitamin D (25 hydroxy)  Stress at home  Type 2 diabetes mellitus with other circulatory complication, without long-term current use of insulin ([place]), Chronic - Plan: Hemoglobin A1c  Diet-controlled diabetes mellitus ([place]) No follow-ups on file.

## 2019-03-12 NOTE — Patient Instructions (Signed)
Please call [facility] at [phone] to schedule a Mammogram :)

## 2019-03-12 NOTE — Assessment & Plan Note (Signed)
Acute on chronic CKD- likely pre renal at the time of evaluation in ED. Will repeat CMET today.

## 2019-03-13 ENCOUNTER — Ambulatory Visit: Payer: Medicare Other | Admitting: Family Medicine

## 2019-03-13 ENCOUNTER — Telehealth: Payer: Self-pay

## 2019-03-13 NOTE — Telephone Encounter (Signed)
Copied from [place] [phone]. Topic: General - Other >> Mar 13, 2019 11:11 AM Burchel, Abbi R wrote: Reason for CRM: Pt's son Oletta Lamas) requesting a call back from Dr Deborra Medina to discuss home health services for pt.   Please call: [phone].

## 2019-03-15 NOTE — Telephone Encounter (Signed)
Her son is unfortunately not on her DPR (unless I am missing something), so unfortunately I cannot legally talk to him.

## 2019-03-16 NOTE — Telephone Encounter (Signed)
I talked to pt son and informed him he has to be on her DPR to have that discussion. He verbalized understanding.

## 2019-03-20 ENCOUNTER — Telehealth: Payer: Self-pay | Admitting: Family Medicine

## 2019-03-20 NOTE — Telephone Encounter (Signed)
Called and left vm for patient. Please see appt note for 03/22/2019

## 2019-03-22 ENCOUNTER — Ambulatory Visit: Payer: Self-pay | Admitting: Family Medicine

## 2019-03-27 ENCOUNTER — Telehealth: Payer: Self-pay

## 2019-03-27 NOTE — Telephone Encounter (Signed)
TA-I'm not certain what to do in this situation/her brother has not brought her to her appointments/What do we do?

## 2019-03-27 NOTE — Telephone Encounter (Signed)
This needs to be routed to [place], [place] and [place] as sadly, there is nothing we can from clinical side.  She needs social work, Social research officer, government.

## 2019-03-27 NOTE — Telephone Encounter (Signed)
Copied from [place] [id number]. Topic: General - Inquiry >> Mar 27, 2019 11:04 AM Virl Axe D wrote: Reason for CRM: Pt's husband Braulio Conte stated pt is not doing well. Stated she is not eating or taking her medication. He understands Dr. Deborra Medina cannot discuss pt's information due to him not being on DPR. He would like to know if someone can reach out to pt's brother who is on DPR regarding pt and possibly getting an appt. He does not know what to do since he is not able to discuss pt's health with PCP. Please advise. CB#[phone]

## 2019-03-28 ENCOUNTER — Telehealth: Payer: Self-pay | Admitting: Family Medicine

## 2019-03-29 NOTE — Telephone Encounter (Signed)
TB-Plz see newest encounter from Dr. Matthews/pt passed on 03-30-2019 @ 3:30pm found 5-10 minutes after she went to the bathroom on the floor and EMS was unable to resuscitate/thx dmf

## 2019-03-29 NOTE — Telephone Encounter (Signed)
NF;RB;TB-Plz see below per TA/"pt needs social work,etc."/pt brother is not ensuring that she is cared for and husband is not on DPR to allow discussion/per Braulio Conte she is not eating and is noncompliant with medications/brother has not brought her to the last couple appointments/thx dmf

## 2019-04-04 ENCOUNTER — Telehealth: Payer: Self-pay | Admitting: Family Medicine

## 2019-04-04 NOTE — Telephone Encounter (Signed)
Pilar Plate from [employer] and [employer] dropped of death certificate of patient. Certificate will be in provider folder. Once signed, please return back to the front so we can call Pilar Plate to see if he would like to pick up certificate or mail it. Sherri [name]'s phone number is [phone]

## 2019-04-05 NOTE — Telephone Encounter (Signed)
F.Y.I I have placed this on Dr. Hulen Shouts desk for her to sign when she arrives back in office next week.

## 2019-04-08 NOTE — Telephone Encounter (Signed)
Thank you for letting me know.  I am so sorry to hear that.

## 2019-04-08 NOTE — Telephone Encounter (Signed)
I received call from [facility] that Ms. Buening unfortunately passed away this afternoon at ~330pm.  They report that per husband she had not been doing well recently with poor po intake and poor compliance with medications. Husband reports that she went to the bathroom and he found that she had collapsed about 5-10 minutes later.  He immediately called EMS and she was unable to be resuscitated.   They are requesting Dr. Deborra Medina to complete death certificate.  I advised that we would do that and will forward note to Dr. Deborra Medina.

## 2019-04-08 DEATH — deceased

## 2019-04-09 NOTE — Telephone Encounter (Signed)
I called and left message on [place] Director voicemail [phone] that death certificate has been signed and ready for pick-up at the front desk.

## 2021-08-23 IMAGING — CT CT ABD-PELV W/ CM
2 of 5 series · 17 of 46 positions shown, 19 images · IV contrast (APPLIED)
Comparison: None.

CLINICAL DATA: Acute epigastric abdominal pain, nausea and
vomiting.

EXAM:
CT ABDOMEN AND PELVIS WITH CONTRAST
TECHNIQUE: Multidetector CT imaging of the abdomen and pelvis was performed
using the standard protocol following bolus administration of
intravenous contrast.
CONTRAST:  80mL OMNIPAQUE IOHEXOL 300 MG/ML  SOLN

[Series 3: abd/ pelvis 5.0 i30f 2 · axial · 0.89mm/px · z∈[+693,+1068]mm · 14 of 83 slices shown, 16 images]
[im 4/83  soft-tissue]
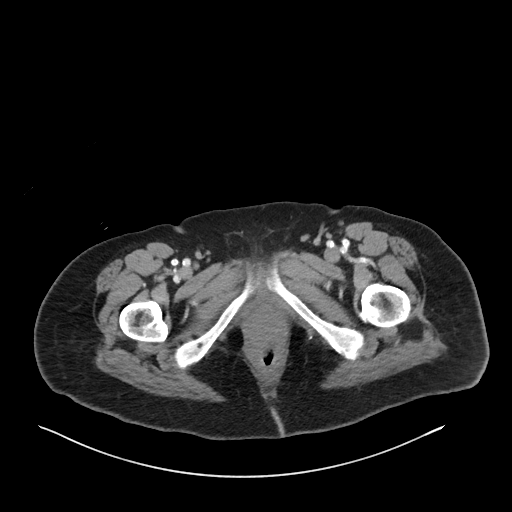
[im 4/83  bone]
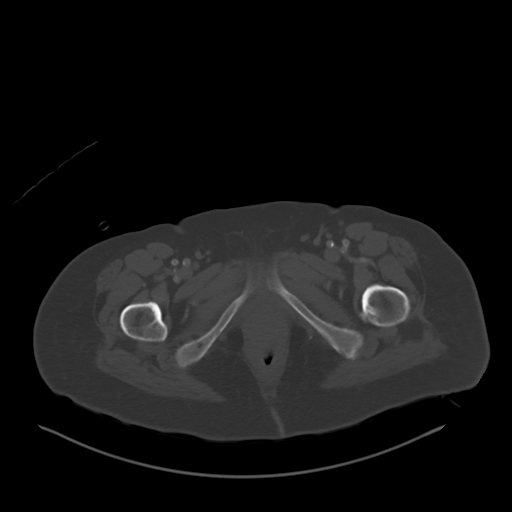
[im 12/83  soft-tissue]
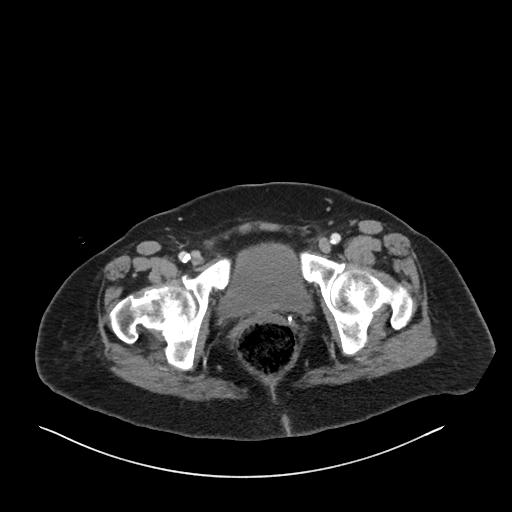
[im 16/83  soft-tissue]
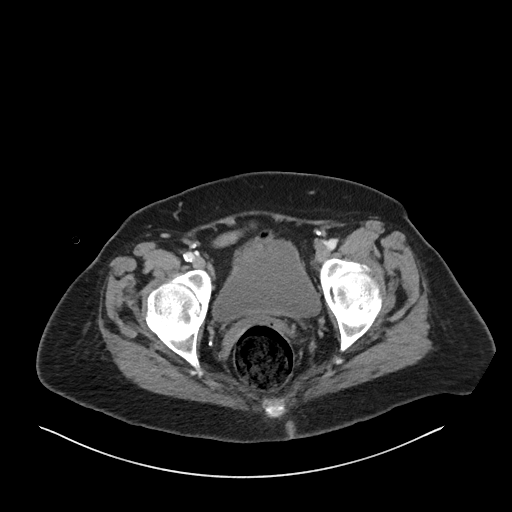
[im 24/83  soft-tissue]
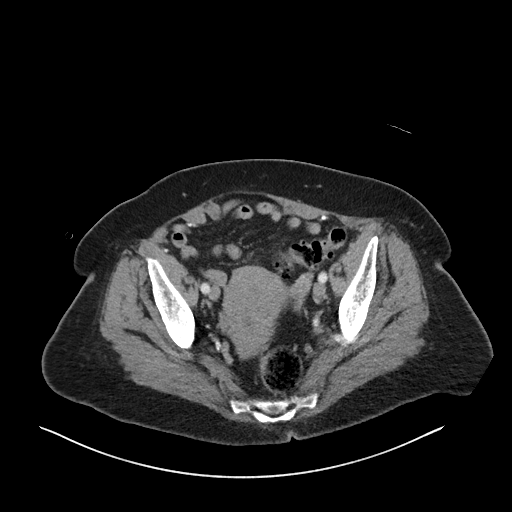
[im 28/83  soft-tissue]
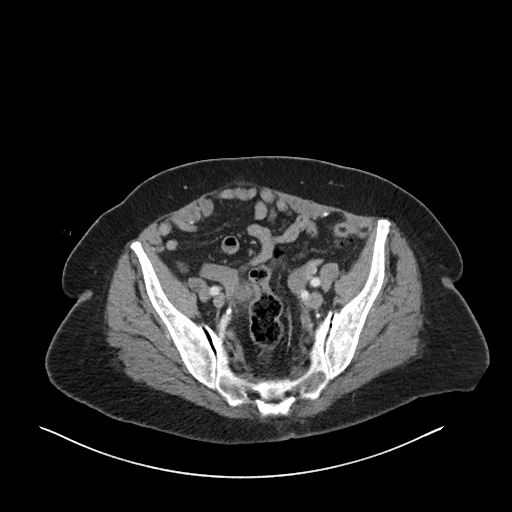
[im 32/83  soft-tissue]
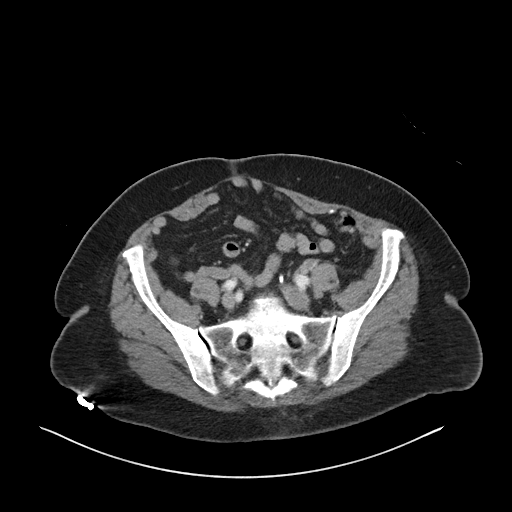
[im 40/83  soft-tissue]
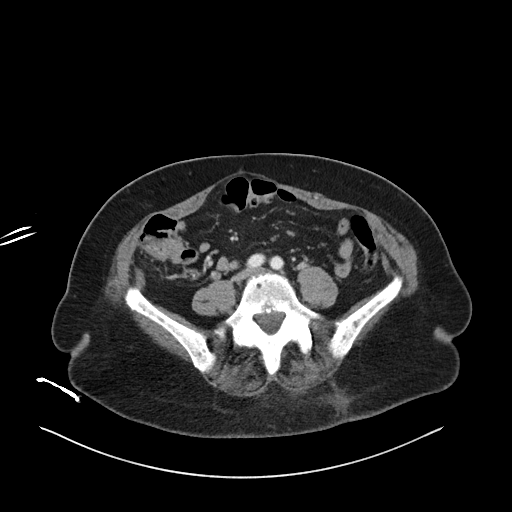
[im 43/83  soft-tissue]
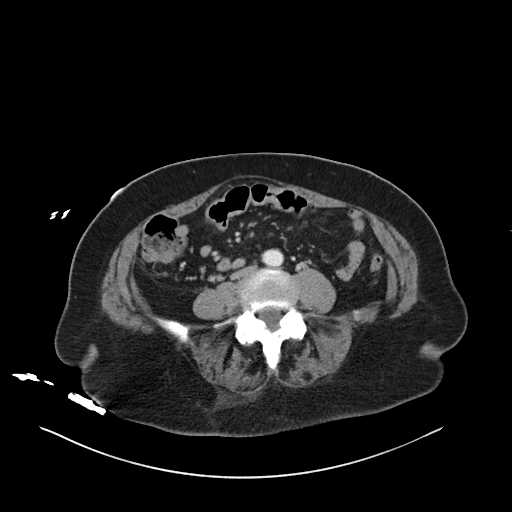
[im 51/83  soft-tissue]
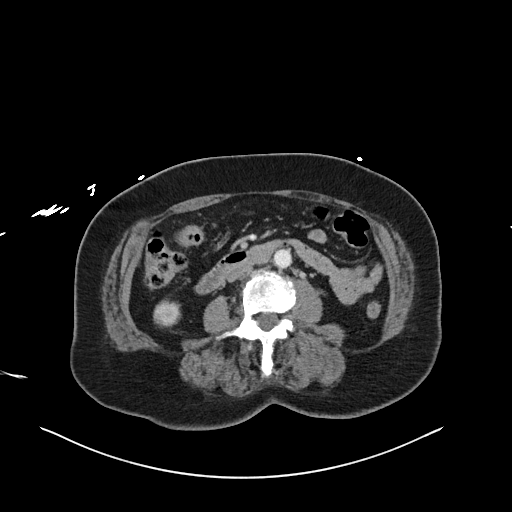
[im 51/83  bone]
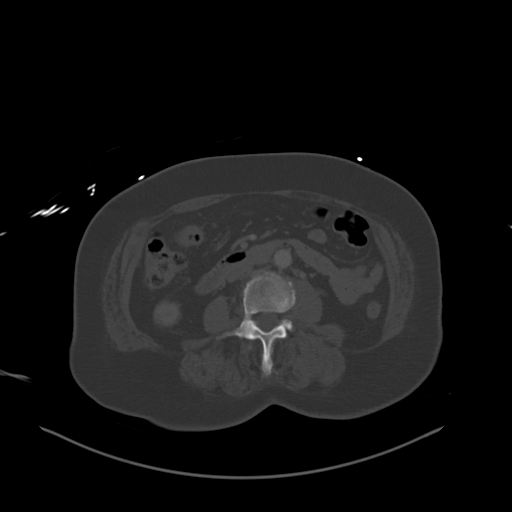
[im 55/83  soft-tissue]
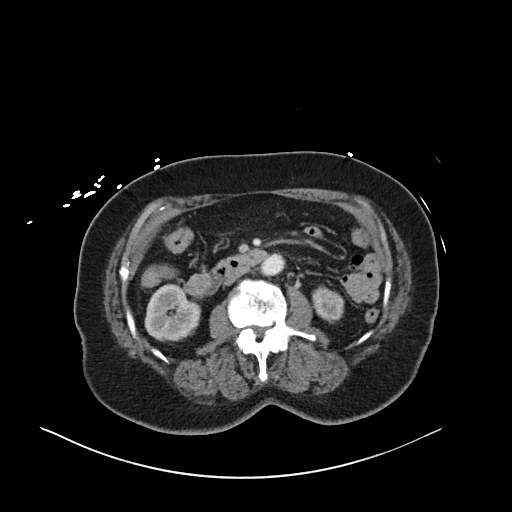
[im 63/83  soft-tissue]
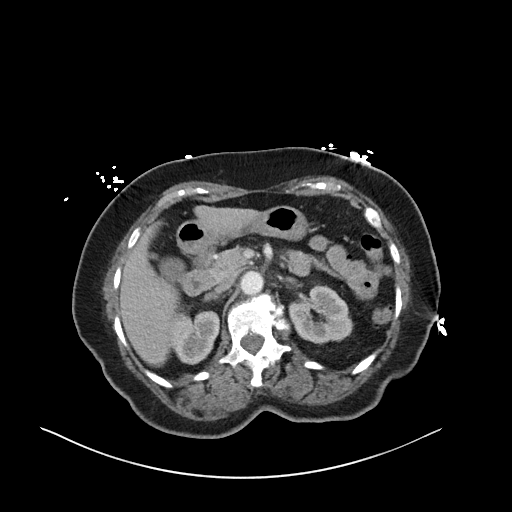
[im 67/83  soft-tissue]
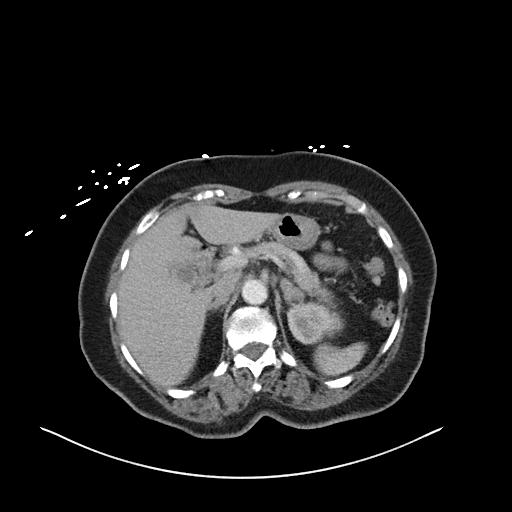
[im 71/83  soft-tissue]
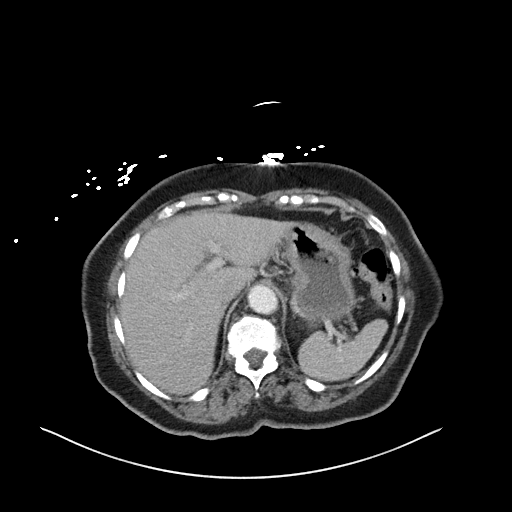
[im 79/83  soft-tissue]
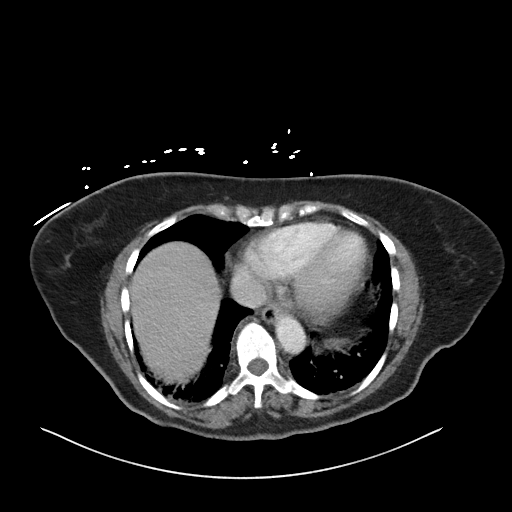

[Series 6: coronal soft tissue · coronal · 0.80mm/px · 3 of 93 slices shown]
[im 31/93  soft-tissue]
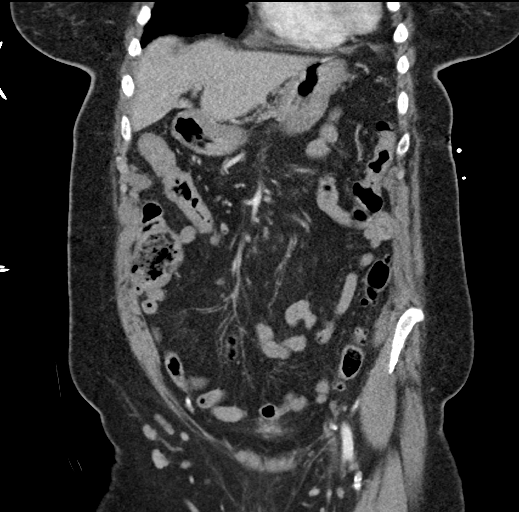
[im 41/93  soft-tissue]
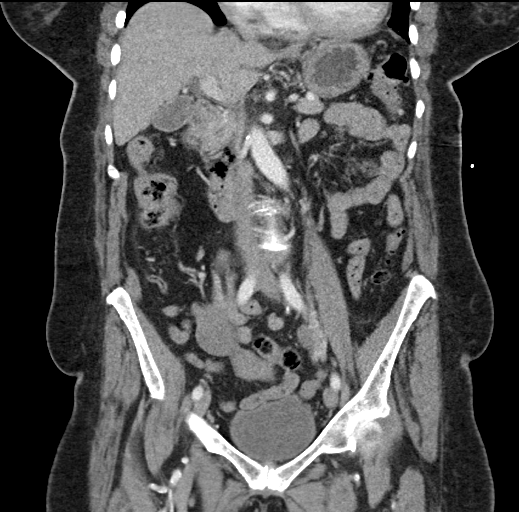
[im 52/93  soft-tissue]
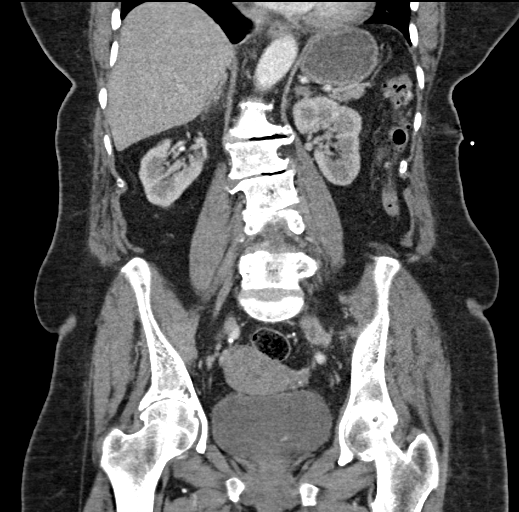

[17 of 46 positions shown; findings below may reference images not displayed]

FINDINGS: Lower chest: No significant pulmonary nodules or acute consolidative
airspace disease.

Hepatobiliary: Normal liver size. No liver mass. Normal gallbladder
with no radiopaque cholelithiasis. No biliary ductal dilatation.
Small to moderate duodenal diverticulum arising superiorly from the
transverse duodenum.

Pancreas: Normal, with no mass or duct dilation.

Spleen: Normal size. No mass.

Adrenals/Urinary Tract: Normal adrenals. No hydronephrosis. No renal
masses. Normal bladder.

Stomach/Bowel: Normal non-distended stomach. Normal caliber small
bowel with no small bowel wall thickening. Normal appendix. Mild
diffuse colonic diverticulosis, with no large bowel wall thickening
or significant pericolonic fat stranding.

Vascular/Lymphatic: Mildly atherosclerotic nonaneurysmal abdominal
aorta. Patent portal, splenic and renal veins. No pathologically
enlarged lymph nodes in the abdomen or pelvis.

Reproductive: Grossly normal uterus.  No adnexal mass.

Other: No pneumoperitoneum, ascites or focal fluid collection. Small
fat containing umbilical hernia.

Musculoskeletal: No aggressive appearing focal osseous lesions.
Marked lumbar spondylosis.
IMPRESSION: 1. No acute abnormality. No evidence of bowel obstruction or acute
bowel inflammation. Normal appendix. Mild diffuse colonic
diverticulosis, with no evidence of acute diverticulitis.
2.  Aortic Atherosclerosis ([id number]).
